# Patient Record
Sex: Female | Born: 1985
Health system: Southern US, Community
[De-identification: ages and names within clinical notes are randomized; demographics above are authoritative.]

## PROBLEM LIST (undated history)

## (undated) ENCOUNTER — Inpatient Hospital Stay (HOSPITAL_COMMUNITY): Payer: Self-pay

## (undated) DIAGNOSIS — D332 Benign neoplasm of brain, unspecified: Secondary | ICD-10-CM

## (undated) DIAGNOSIS — K589 Irritable bowel syndrome without diarrhea: Secondary | ICD-10-CM

## (undated) DIAGNOSIS — N8000 Endometriosis of the uterus, unspecified: Secondary | ICD-10-CM

## (undated) DIAGNOSIS — Z9109 Other allergy status, other than to drugs and biological substances: Secondary | ICD-10-CM

## (undated) DIAGNOSIS — M25559 Pain in unspecified hip: Secondary | ICD-10-CM

## (undated) DIAGNOSIS — E669 Obesity, unspecified: Secondary | ICD-10-CM

## (undated) DIAGNOSIS — R51 Headache: Secondary | ICD-10-CM

## (undated) DIAGNOSIS — R0600 Dyspnea, unspecified: Secondary | ICD-10-CM

## (undated) DIAGNOSIS — K219 Gastro-esophageal reflux disease without esophagitis: Secondary | ICD-10-CM

## (undated) DIAGNOSIS — I82409 Acute embolism and thrombosis of unspecified deep veins of unspecified lower extremity: Secondary | ICD-10-CM

## (undated) DIAGNOSIS — G629 Polyneuropathy, unspecified: Secondary | ICD-10-CM

## (undated) DIAGNOSIS — D259 Leiomyoma of uterus, unspecified: Secondary | ICD-10-CM

## (undated) DIAGNOSIS — J309 Allergic rhinitis, unspecified: Secondary | ICD-10-CM

## (undated) DIAGNOSIS — G894 Chronic pain syndrome: Secondary | ICD-10-CM

## (undated) DIAGNOSIS — D649 Anemia, unspecified: Secondary | ICD-10-CM

## (undated) DIAGNOSIS — N6009 Solitary cyst of unspecified breast: Secondary | ICD-10-CM

## (undated) DIAGNOSIS — F4310 Post-traumatic stress disorder, unspecified: Secondary | ICD-10-CM

## (undated) DIAGNOSIS — M329 Systemic lupus erythematosus, unspecified: Secondary | ICD-10-CM

## (undated) DIAGNOSIS — R519 Headache, unspecified: Secondary | ICD-10-CM

## (undated) DIAGNOSIS — F419 Anxiety disorder, unspecified: Secondary | ICD-10-CM

## (undated) DIAGNOSIS — N83209 Unspecified ovarian cyst, unspecified side: Secondary | ICD-10-CM

## (undated) DIAGNOSIS — M797 Fibromyalgia: Secondary | ICD-10-CM

## (undated) DIAGNOSIS — J45909 Unspecified asthma, uncomplicated: Secondary | ICD-10-CM

## (undated) DIAGNOSIS — Z6837 Body mass index (BMI) 37.0-37.9, adult: Secondary | ICD-10-CM

## (undated) DIAGNOSIS — M159 Polyosteoarthritis, unspecified: Secondary | ICD-10-CM

## (undated) DIAGNOSIS — J3081 Allergic rhinitis due to animal (cat) (dog) hair and dander: Secondary | ICD-10-CM

## (undated) HISTORY — DX: Dyspnea, unspecified: R06.00

## (undated) HISTORY — DX: Allergic rhinitis due to animal (cat) (dog) hair and dander: J30.81

## (undated) HISTORY — DX: Obesity, unspecified: E66.9

## (undated) HISTORY — PX: ECTOPIC PREGNANCY SURGERY: SHX613

## (undated) HISTORY — PX: OTHER SURGICAL HISTORY: SHX169

## (undated) HISTORY — DX: Chronic pain syndrome: G89.4

## (undated) HISTORY — PX: DILATION AND CURETTAGE OF UTERUS: SHX78

## (undated) HISTORY — DX: Anxiety disorder, unspecified: F41.9

## (undated) HISTORY — DX: Body mass index (BMI) 37.0-37.9, adult: Z68.37

## (undated) HISTORY — DX: Irritable bowel syndrome, unspecified: K58.9

## (undated) HISTORY — DX: Gastro-esophageal reflux disease without esophagitis: K21.9

## (undated) HISTORY — DX: Leiomyoma of uterus, unspecified: D25.9

## (undated) HISTORY — PX: OVARIAN CYST SURGERY: SHX726

## (undated) HISTORY — PX: INDUCED ABORTION: SHX677

## (undated) HISTORY — DX: Allergic rhinitis, unspecified: J30.9

## (undated) HISTORY — DX: Systemic lupus erythematosus, unspecified: M32.9

## (undated) HISTORY — DX: Polyosteoarthritis, unspecified: M15.9

---
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
## 2003-12-08 ENCOUNTER — Other Ambulatory Visit: Admission: RE | Admit: 2003-12-08 | Discharge: 2003-12-08 | Payer: Self-pay | Admitting: Obstetrics & Gynecology

## 2004-06-25 ENCOUNTER — Inpatient Hospital Stay (HOSPITAL_COMMUNITY): Admission: AD | Admit: 2004-06-25 | Discharge: 2004-06-26 | Payer: Self-pay | Admitting: Obstetrics and Gynecology

## 2004-07-09 ENCOUNTER — Inpatient Hospital Stay (HOSPITAL_COMMUNITY): Admission: AD | Admit: 2004-07-09 | Discharge: 2004-07-09 | Payer: Self-pay | Admitting: Obstetrics and Gynecology

## 2004-07-28 ENCOUNTER — Inpatient Hospital Stay (HOSPITAL_COMMUNITY): Admission: RE | Admit: 2004-07-28 | Discharge: 2004-07-31 | Payer: Self-pay | Admitting: Obstetrics and Gynecology

## 2004-08-04 ENCOUNTER — Inpatient Hospital Stay (HOSPITAL_COMMUNITY): Admission: AD | Admit: 2004-08-04 | Discharge: 2004-08-07 | Payer: Self-pay | Admitting: Obstetrics and Gynecology

## 2005-01-27 ENCOUNTER — Inpatient Hospital Stay (HOSPITAL_COMMUNITY): Admission: AD | Admit: 2005-01-27 | Discharge: 2005-01-27 | Payer: Self-pay | Admitting: Obstetrics & Gynecology

## 2012-03-14 HISTORY — PX: GASTRIC BYPASS: SHX52

## 2015-04-27 ENCOUNTER — Encounter (HOSPITAL_COMMUNITY): Payer: Self-pay | Admitting: *Deleted

## 2015-04-27 ENCOUNTER — Inpatient Hospital Stay (HOSPITAL_COMMUNITY)
Admission: AD | Admit: 2015-04-27 | Discharge: 2015-04-27 | Disposition: A | Discharge status: Home / Self Care | Payer: PRIVATE HEALTH INSURANCE | Source: Ambulatory Visit | Attending: Family Medicine | Admitting: Family Medicine

## 2015-04-27 DIAGNOSIS — D689 Coagulation defect, unspecified: Secondary | ICD-10-CM

## 2015-04-27 DIAGNOSIS — Z86718 Personal history of other venous thrombosis and embolism: Secondary | ICD-10-CM | POA: Insufficient documentation

## 2015-04-27 DIAGNOSIS — N939 Abnormal uterine and vaginal bleeding, unspecified: Secondary | ICD-10-CM

## 2015-04-27 DIAGNOSIS — D649 Anemia, unspecified: Secondary | ICD-10-CM | POA: Insufficient documentation

## 2015-04-27 DIAGNOSIS — R42 Dizziness and giddiness: Secondary | ICD-10-CM | POA: Diagnosis not present

## 2015-04-27 DIAGNOSIS — T457X1A Poisoning by anticoagulant antagonists, vitamin K and other coagulants, accidental (unintentional), initial encounter: Secondary | ICD-10-CM

## 2015-04-27 DIAGNOSIS — N938 Other specified abnormal uterine and vaginal bleeding: Secondary | ICD-10-CM | POA: Insufficient documentation

## 2015-04-27 HISTORY — DX: Solitary cyst of unspecified breast: N60.09

## 2015-04-27 HISTORY — DX: Acute embolism and thrombosis of unspecified deep veins of unspecified lower extremity: I82.409

## 2015-04-27 HISTORY — DX: Headache, unspecified: R51.9

## 2015-04-27 HISTORY — DX: Other allergy status, other than to drugs and biological substances: Z91.09

## 2015-04-27 HISTORY — DX: Anemia, unspecified: D64.9

## 2015-04-27 HISTORY — DX: Unspecified asthma, uncomplicated: J45.909

## 2015-04-27 HISTORY — DX: Pain in unspecified hip: M25.559

## 2015-04-27 HISTORY — DX: Unspecified ovarian cyst, unspecified side: N83.209

## 2015-04-27 HISTORY — DX: Headache: R51

## 2015-04-27 LAB — URINALYSIS, ROUTINE W REFLEX MICROSCOPIC
Bilirubin Urine: NEGATIVE
Glucose, UA: NEGATIVE mg/dL
Ketones, ur: NEGATIVE mg/dL
Leukocytes, UA: NEGATIVE
Nitrite: NEGATIVE
Protein, ur: NEGATIVE mg/dL
Specific Gravity, Urine: 1.025 (ref 1.005–1.030)
Urobilinogen, UA: 0.2 mg/dL (ref 0.0–1.0)
pH: 5.5 (ref 5.0–8.0)

## 2015-04-27 LAB — POCT PREGNANCY, URINE: Preg Test, Ur: NEGATIVE

## 2015-04-27 LAB — CBC
HCT: 36.3 % (ref 36.0–46.0)
Hemoglobin: 12.4 g/dL (ref 12.0–15.0)
MCH: 28.4 pg (ref 26.0–34.0)
MCHC: 34.2 g/dL (ref 30.0–36.0)
MCV: 83.1 fL (ref 78.0–100.0)
Platelets: 341 10*3/uL (ref 150–400)
RBC: 4.37 MIL/uL (ref 3.87–5.11)
RDW: 13.2 % (ref 11.5–15.5)
WBC: 7.7 10*3/uL (ref 4.0–10.5)

## 2015-04-27 LAB — URINE MICROSCOPIC-ADD ON

## 2015-04-27 NOTE — MAU Note (Signed)
Has been bleeding, will be 2 wks tomorrow.  Was late for period.  Last Aug, had a clot in leg; was put on blood thinners, changed types..  IUD placed in Oct to help stop bleeding; bleeding is less now.  Stopped blood thinners 2 months ago. Has felt weak and dizzy the past wk, felt like she was going to pass out at work today. Having pain in rt hip/groin.  Hx of injury, and is doing PT, pain has increased with cycle.

## 2015-04-27 NOTE — MAU Provider Note (Signed)
History     CSN: [id number]  Arrival date and time: 04/27/15 1753   None     Chief Complaint  Patient presents with  . Vaginal Bleeding   HPI  Patient is 29 y.o. [id number] here with complaints of heavy-light bleeding x 2 weeks.  "this morning I was hemorrhaging", felt light headed with "pain as it came down".    Fell today, knees hit concrete and now has right hip pain.  Sees physical therapy for hip pain, fell last year off table.   Hx of DVT, stopped Xarelto in October and then switched to Warfarin 2/2 increased bleeding nose/gums.  Stopped coumadin January to February.  Has had IUD since October with overall improved vaginal bleeding.  Pertinent Gynecological History: Menses: previously heavy, now with IUD week or more Bleeding: intermenstrual bleeding and dysfunctional uterine bleeding Contraception: IUD DES exposure: denies Blood transfusions: none Sexually transmitted diseases: hx of chlamydia 13 years ago, possibly tested 1 year ago in [place] Previous GYN Procedures: nne  Last pap: normal Date: 2015 in [place]   Past Medical History  Diagnosis Date  . Deep vein thrombosis (DVT)   . Hip discomfort   . Anemia   . Headache   . Asthma   . Environmental allergies   . Ovarian cyst   . Breast cyst     Past Surgical History  Procedure Laterality Date  . Appendectomy    . Cesarean section    . Gastric bypass    . Induced abortion    . Etopi    . Ectopic pregnancy surgery    . Ovarian cyst surgery      Family History  Problem Relation Age of Onset  . Cancer Mother   . Hypertension Mother   . Cancer Father   . Hypertension Maternal Uncle   . Diabetes Paternal Aunt   . Cancer Maternal Grandmother     History  Substance Use Topics  . Smoking status: Never Smoker   . Smokeless tobacco: Not on file  . Alcohol Use: No    Allergies:  Allergies  Allergen Reactions  . Shellfish Allergy Anaphylaxis    Patient is allergic to all seafood.  Marland Kitchen Histamine  Other (See Comments)    Patient is tested positive in allergy testing to Control-histamine 1.  . Other Swelling    Mayonnaise causes swelling of the tongue.  Marland Kitchen Peanut Butter Flavor Hives  . Penicillins Hives  . Voltaren [Diclofenac] Other (See Comments)    Volatren gel causes blisters.    Prescriptions prior to admission  Medication Sig Dispense Refill Last Dose  . albuterol (PROVENTIL HFA;VENTOLIN HFA) 108 (90 BASE) MCG/ACT inhaler Inhale 2 puffs into the lungs every 6 (six) hours as needed for wheezing or shortness of breath.   Past Week at Unknown time  . cetirizine (ZYRTEC) 10 MG tablet Take 10 mg by mouth at bedtime.   04/26/2015 at Unknown time  . dicyclomine (BENTYL) 10 MG capsule Take 10 mg by mouth 3 (three) times daily as needed for spasms.   04/26/2015 at Unknown time  . EPINEPHrine (EPIPEN IJ) Inject 1 each as directed once.   Emergency  . ferrous sulfate 325 (65 FE) MG tablet Take 325 mg by mouth daily with breakfast.   Past Week at Unknown time  . HYDROcodone-acetaminophen (NORCO/VICODIN) 5-325 MG per tablet Take 1-2 tablets by mouth every 6 (six) hours as needed for severe pain.   Past Month at Unknown time  . Levonorgestrel (SKYLA) 13.5 MG  IUD 1 each by Intrauterine route once.   Continuous  . montelukast (SINGULAIR) 10 MG tablet Take 10 mg by mouth at bedtime.   04/26/2015 at Unknown time  . Olopatadine HCl (PAZEO) 0.7 % SOLN Place 1 drop into both eyes daily.   04/26/2015 at Unknown time  . ranitidine (ZANTAC) 300 MG tablet Take 300 mg by mouth at bedtime.   04/26/2015 at Unknown time  . traMADol (ULTRAM) 50 MG tablet Take 50 mg by mouth every 6 (six) hours as needed for moderate pain.   Past Week at Unknown time  . triamcinolone cream (KENALOG) 0.5 % Apply 1 application topically 3 (three) times daily as needed (For rash.).   04/27/2015 at Unknown time    Review of Systems  Constitutional: Positive for fever (resolved). Negative for chills.  HENT: Positive for congestion  (resolved).   Respiratory: Positive for cough (resolved) and shortness of breath.   Cardiovascular: Negative for leg swelling.  Gastrointestinal: Positive for nausea, vomiting and diarrhea. Negative for heartburn.       All GI sx resolved  Genitourinary: Negative for dysuria, urgency, frequency and hematuria.  Musculoskeletal: Positive for joint pain (right hip, chronic) and falls (x1 today).  Skin: Negative for itching and rash.  Neurological: Positive for dizziness and headaches. Negative for loss of consciousness.   Physical Exam   Blood pressure 124/80, pulse 70, temperature 98.6 F (37 C), temperature source Oral, resp. rate 18, height 4\' 7"  (1.397 m), weight 159 lb (72.122 kg), last menstrual period 04/15/2015, SpO2 100 %.  Physical Exam  Constitutional: She is oriented to person, place, and time. She appears well-developed and well-nourished.  HENT:  Head: Normocephalic and atraumatic.  Eyes: Conjunctivae and EOM are normal.  Neck: Normal range of motion.  Cardiovascular: Normal rate, regular rhythm and normal heart sounds.   Respiratory: Effort normal. No respiratory distress.  GI: Soft. Bowel sounds are normal. She exhibits no distension. There is no tenderness.  Musculoskeletal: Normal range of motion. She exhibits edema (trace).  Neurological: She is alert and oriented to person, place, and time.  Skin: Skin is warm and dry. No erythema.  declined evaluation of vaginal bleeding/GU examination   MAU Course  Procedures  MDM CBC to eval for severe anemia: hgb 12  Assessment and Plan  29yo [id number] here with dizziness, heavy bleeding with current Mirena (x37mo) in place 2/2 dysfunctional uterine bleeding - continue iron supplementation - CBC to eval to severe anemia - advised to follow up with OB/GYN as outpt for possible ablation - advised that mirena likely helping bleeding rather than worsening, which patient agreed with.  Nina Wood 04/27/2015, 7:13 PM

## 2015-04-27 NOTE — MAU Note (Addendum)
C/o bleeding for 2 weeks; hx of DVT- stopped coumadin in January; IUD placed in October; increased pain in her R hip with periods; c/o fall today;

## 2015-04-27 NOTE — Discharge Instructions (Signed)
Abnormal Uterine Bleeding Abnormal uterine bleeding can affect women at various stages in life, including teenagers, women in their reproductive years, pregnant women, and women who have reached menopause. Several kinds of uterine bleeding are considered abnormal, including:  Bleeding or spotting between periods.   Bleeding after sexual intercourse.   Bleeding that is heavier or more than normal.   Periods that last longer than usual.  Bleeding after menopause.  Many cases of abnormal uterine bleeding are minor and simple to treat, while others are more serious. Any type of abnormal bleeding should be evaluated by your health care provider. Treatment will depend on the cause of the bleeding. HOME CARE INSTRUCTIONS Monitor your condition for any changes. The following actions may help to alleviate any discomfort you are experiencing:  Avoid the use of tampons and douches as directed by your health care provider.  Change your pads frequently. You should get regular pelvic exams and Pap tests. Keep all follow-up appointments for diagnostic tests as directed by your health care provider.  SEEK MEDICAL CARE IF:   Your bleeding lasts more than 1 week.   You feel dizzy at times.  SEEK IMMEDIATE MEDICAL CARE IF:   You pass out.   You are changing pads every 15 to 30 minutes.   You have abdominal pain.  You have a fever.   You become sweaty or weak.   You are passing large blood clots from the vagina.   You start to feel nauseous and vomit. MAKE SURE YOU:   Understand these instructions.  Will watch your condition.  Will get help right away if you are not doing well or get worse. Document Released: 10/30/2005 Document Revised: 11/04/2013 Document Reviewed: 05/29/2013 ExitCare Patient Information 2015 [employer]. This information is not intended to replace advice given to you by your health care provider. Make sure you discuss any questions you have with your  health care provider.  

## 2015-05-05 ENCOUNTER — Inpatient Hospital Stay (HOSPITAL_COMMUNITY)
Admission: AD | Admit: 2015-05-05 | Discharge: 2015-05-05 | Disposition: A | Discharge status: Home / Self Care | Payer: PRIVATE HEALTH INSURANCE | Source: Ambulatory Visit | Attending: Obstetrics & Gynecology | Admitting: Obstetrics & Gynecology

## 2015-05-05 ENCOUNTER — Encounter (HOSPITAL_COMMUNITY): Payer: Self-pay

## 2015-05-05 ENCOUNTER — Inpatient Hospital Stay (HOSPITAL_COMMUNITY): Payer: PRIVATE HEALTH INSURANCE

## 2015-05-05 DIAGNOSIS — R102 Pelvic and perineal pain: Secondary | ICD-10-CM | POA: Diagnosis not present

## 2015-05-05 DIAGNOSIS — N939 Abnormal uterine and vaginal bleeding, unspecified: Secondary | ICD-10-CM

## 2015-05-05 DIAGNOSIS — T8332XA Displacement of intrauterine contraceptive device, initial encounter: Secondary | ICD-10-CM | POA: Insufficient documentation

## 2015-05-05 DIAGNOSIS — N8320 Unspecified ovarian cysts: Secondary | ICD-10-CM | POA: Diagnosis not present

## 2015-05-05 DIAGNOSIS — N83201 Unspecified ovarian cyst, right side: Secondary | ICD-10-CM

## 2015-05-05 DIAGNOSIS — R1031 Right lower quadrant pain: Secondary | ICD-10-CM | POA: Diagnosis present

## 2015-05-05 LAB — URINALYSIS, ROUTINE W REFLEX MICROSCOPIC
Bilirubin Urine: NEGATIVE
Glucose, UA: NEGATIVE mg/dL
Hgb urine dipstick: NEGATIVE
Ketones, ur: NEGATIVE mg/dL
Leukocytes, UA: NEGATIVE
Nitrite: NEGATIVE
Protein, ur: NEGATIVE mg/dL
Specific Gravity, Urine: 1.02 (ref 1.005–1.030)
Urobilinogen, UA: 0.2 mg/dL (ref 0.0–1.0)
pH: 6 (ref 5.0–8.0)

## 2015-05-05 MED ORDER — KETOROLAC TROMETHAMINE 30 MG/ML IJ SOLN
30.0000 mg | Freq: Once | INTRAMUSCULAR | Status: AC
  Administered 2015-05-05: 30 mg via INTRAMUSCULAR
  Filled 2015-05-05: qty 1

## 2015-05-05 MED ORDER — IBUPROFEN 600 MG PO TABS: 600.0000 mg | ORAL_TABLET | Freq: Four times a day (QID) | ORAL | Status: DC | PRN

## 2015-05-05 NOTE — MAU Provider Note (Signed)
History     CSN: [id number]  Arrival date and time: 05/05/15 1523   First Provider Initiated Contact with Patient 05/05/15 1617      Chief Complaint  Patient presents with  . Abdominal Pain   HPI Comments: Nina Wood is a 29 y.o. [phone] presents with worsening right lower quadrant abdominal pain. Onset was 04/27/2015 when she was seen here for vaginal bleeding. States she saw her gynecologist in [place] the next day with pelvic exam/cultures and then had a [facility] at [facility]/30/[facility]59. She was diagnosed with right ovarian cyst. She called her gynecologist today and was told to go to the ED. 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    Abdominal Pain This is a new problem. The current episode started in the past 7 days. The onset quality is gradual. The problem occurs constantly. The most recent episode lasted 7 days. The problem has been waxing and waning. The pain is located in the RLQ. The pain is at a severity of 8/10. The pain is severe. The quality of the pain is sharp. The abdominal pain radiates to the back and RUQ. Associated symptoms include nausea and vomiting. Pertinent negatives include no constipation, diarrhea, frequency, hematuria or melena. Nothing aggravates the pain. The pain is relieved by nothing (Has tried ibuprofen. Has Norco and Ultram but only took it on the weekend due to driving and work). Treatments tried: tried Ultram and Norco on the weekend with some relief. Has tried ibuprofen with little relief. Her past medical history is significant for abdominal surgery. There is no history of GERD or irritable bowel syndrome.    Gynecology Hx:  Menses: Previously heavy now light since IUD placement; has had intermenstrual bleeding and menorrhagia Contraception: Skylar IUD since October 2015 STI: Chlamydia several years ago Previous GYN procedures: None Last  Pap normal in [place].    Past Medical History  Diagnosis Date  . Deep vein thrombosis (DVT)   . Hip discomfort   . Anemia   . Headache   . Asthma   . Environmental allergies   . Ovarian cyst   . Breast cyst     Past Surgical History  Procedure Laterality Date  . Appendectomy    . Cesarean section    . Gastric bypass    . Induced abortion    . Etopi    . Ectopic pregnancy surgery    . Ovarian cyst surgery      Family History  Problem Relation Age of Onset  . Cancer Mother   . Hypertension Mother   . Cancer Father   . Hypertension Maternal Uncle   . Diabetes Paternal Aunt   . Cancer Maternal Grandmother     History  Substance Use Topics  . Smoking status: Never Smoker   . Smokeless tobacco: Not on file  . Alcohol Use: No    Allergies:  Allergies  Allergen Reactions  . Shellfish Allergy Anaphylaxis    Patient is allergic to all seafood.  Marland Kitchen Histamine Other (See Comments)    Patient is tested positive in allergy testing to Control-histamine 1.  . Other Swelling    Mayonnaise causes swelling of the tongue.  Marland Kitchen Peanut Butter Flavor Hives  . Penicillins Hives  . Voltaren [Diclofenac] Other (See Comments)    Volatren gel causes blisters.    Prescriptions prior to admission  Medication Sig Dispense Refill Last Dose  .  albuterol (PROVENTIL HFA;VENTOLIN HFA) 108 (90 BASE) MCG/ACT inhaler Inhale 2 puffs into the lungs every 6 (six) hours as needed for wheezing or shortness of breath.   Past Week at Unknown time  . cetirizine (ZYRTEC) 10 MG tablet Take 10 mg by mouth at bedtime.   05/04/2015 at Unknown time  . dicyclomine (BENTYL) 10 MG capsule Take 10 mg by mouth 3 (three) times daily as needed for spasms.   Past Week at Unknown time  . ferrous sulfate 325 (65 FE) MG tablet Take 325 mg by mouth daily with breakfast.   05/04/2015 at Unknown time  . HYDROcodone-acetaminophen (NORCO/VICODIN) 5-325 MG per tablet Take 1-2 tablets by mouth every 6 (six) hours as needed  for severe pain.   Past Week at Unknown time  . montelukast (SINGULAIR) 10 MG tablet Take 10 mg by mouth at bedtime.   05/04/2015 at Unknown time  . Olopatadine HCl (PAZEO) 0.7 % SOLN Place 1 drop into both eyes daily.   Past Week at Unknown time  . ranitidine (ZANTAC) 300 MG tablet Take 300 mg by mouth at bedtime.   Past Month at Unknown time  . traMADol (ULTRAM) 50 MG tablet Take 50 mg by mouth every 6 (six) hours as needed for moderate pain.   05/04/2015 at Unknown time  . triamcinolone cream (KENALOG) 0.5 % Apply 1 application topically 3 (three) times daily as needed (For rash.).   05/04/2015 at Unknown time  . EPINEPHrine (EPIPEN IJ) Inject 1 each as directed once.   Emergency  . Levonorgestrel (SKYLA) 13.5 MG IUD 1 each by Intrauterine route once.   Continuous    Review of Systems  Gastrointestinal: Positive for nausea, vomiting and abdominal pain. Negative for diarrhea, constipation and melena.  Genitourinary: Negative for frequency and hematuria.   Physical Exam   Blood pressure 115/83, pulse 71, temperature 98.3 F (36.8 C), temperature source Oral, resp. rate 20, last menstrual period 04/15/2015.  Physical Exam  Nursing note and vitals reviewed. Constitutional: She is oriented to person, place, and time. She appears well-developed and well-nourished. No distress.  HENT:  Head: Normocephalic.  Eyes: Pupils are equal, round, and reactive to light.  Neck: Normal range of motion.  Cardiovascular: Normal rate.   Respiratory: Effort normal.  GI: Soft. Bowel sounds are normal. She exhibits no distension and no mass. There is tenderness. There is no rebound and no guarding.  RLQ  Genitourinary:  Deferred  Musculoskeletal: Normal range of motion.  Neurological: She is alert and oriented to person, place, and time.  Skin: Skin is warm and dry.  Psychiatric: She has a normal mood and affect. Her behavior is normal. Thought content normal.    MAU Course  Procedures  Results for  orders placed or performed during the hospital encounter of 05/05/15 (from the past 24 hour(s))  Urinalysis, Routine w reflex microscopic (not at [facility])     Status: None   Collection Time: 05/05/15  3:25 PM  Result Value Ref Range   Color, Urine YELLOW YELLOW   APPearance CLEAR CLEAR   Specific Gravity, Urine 1.020 1.005 - 1.030   pH 6.0 5.0 - 8.0   Glucose, UA NEGATIVE NEGATIVE mg/dL   Hgb urine dipstick NEGATIVE NEGATIVE   Bilirubin Urine NEGATIVE NEGATIVE   Ketones, ur NEGATIVE NEGATIVE mg/dL   Protein, ur NEGATIVE NEGATIVE mg/dL   Urobilinogen, UA 0.2 0.0 - 1.0 mg/dL   Nitrite NEGATIVE NEGATIVE   Leukocytes, UA NEGATIVE NEGATIVE   Toradol 30mg  IM given>  good relief    CLINICAL DATA: Patient with pelvic pain and history of ovarian cyst. Abnormal bleeding.  EXAM: TRANSABDOMINAL AND TRANSVAGINAL ULTRASOUND OF PELVIS  TECHNIQUE: Both transabdominal and transvaginal ultrasound examinations of the pelvis were performed. Transabdominal technique was performed for global imaging of the pelvis including uterus, ovaries, adnexal regions, and pelvic cul-de-sac. It was necessary to proceed with endovaginal exam following the transabdominal exam to visualize the adnexal structures.  COMPARISON: CT 04/30/2015  FINDINGS: Uterus  Measurements: 9.6 x 4.4 x 5.1 cm. No fibroids or other mass visualized.  Endometrium  Thickness: 6 mm. Intrauterine device is present. N is located within the lower uterine segment and cervix.  Right ovary  Measurements: 5.6 x 3.6 x 4.3 cm. There is a 3.9 x 2.3 x 2.7 cm cyst within the right ovary.  Left ovary  Measurements: 2.9 x 1.9 x 2.0 cm. Normal appearance/no adnexal mass.  Other findings  No free fluid.  IMPRESSION: Intrauterine device is located within the lower uterine segment/ cervix, malpositioned.  Endometrium measures 6 mm. If bleeding remains unresponsive to hormonal or medical therapy, sonohysterogram  should be considered for focal lesion work-up. (Ref: Radiological Reasoning: Algorithmic Workup of Abnormal Vaginal Bleeding with Endovaginal Sonography and Sonohysterography. AJR 2008; [id number]:Q76-19)   Electronically Signed  By: Lovey Newcomer M.D.  On: 05/05/2015 18:16      Assessment and Plan   1. Cyst of right ovary   2. Pelvic pain in female   3. Abnormal uterine bleeding (AUB)   4. Malpositioned IUD, initial encounter    Discharge with ibuprofen added to pain management regimen   Medication List    TAKE these medications        albuterol 108 (90 BASE) MCG/ACT inhaler  Commonly known as:  PROVENTIL HFA;VENTOLIN HFA  Inhale 2 puffs into the lungs every 6 (six) hours as needed for wheezing or shortness of breath.     cetirizine 10 MG tablet  Commonly known as:  ZYRTEC  Take 10 mg by mouth at bedtime.     dicyclomine 10 MG capsule  Commonly known as:  BENTYL  Take 10 mg by mouth 3 (three) times daily as needed for spasms.     EPIPEN IJ  Inject 1 each as directed once.     ferrous sulfate 325 (65 FE) MG tablet  Take 325 mg by mouth daily with breakfast.     HYDROcodone-acetaminophen 5-325 MG per tablet  Commonly known as:  NORCO/VICODIN  Take 1-2 tablets by mouth every 6 (six) hours as needed for severe pain.     ibuprofen 600 MG tablet  Commonly known as:  ADVIL,MOTRIN  Take 1 tablet (600 mg total) by mouth every 6 (six) hours as needed.     montelukast 10 MG tablet  Commonly known as:  SINGULAIR  Take 10 mg by mouth at bedtime.     PAZEO 0.7 % Soln  Generic drug:  Olopatadine HCl  Place 1 drop into both eyes daily.     ranitidine 300 MG tablet  Commonly known as:  ZANTAC  Take 300 mg by mouth at bedtime.     SKYLA 13.5 MG Iud  Generic drug:  Levonorgestrel  1 each by Intrauterine route once.     traMADol 50 MG tablet  Commonly known as:  ULTRAM  Take 50 mg by mouth every 6 (six) hours as needed for moderate pain.     triamcinolone cream 0.5 %   Commonly known as:  KENALOG  Apply 1  application topically 3 (three) times daily as needed (For rash.).       Follow-up Information    Follow up with [facility] gynecologist In 2 weeks.   Why:  Keep your scheduled  appointment    .  Dhani Imel 05/05/2015, 4:44 PM

## 2015-05-05 NOTE — Discharge Instructions (Signed)
Ovarian Cyst An ovarian cyst is a fluid-filled sac that forms on an ovary. The ovaries are small organs that produce eggs in women. Various types of cysts can form on the ovaries. Most are not cancerous. Many do not cause problems, and they often go away on their own. Some may cause symptoms and require treatment. Common types of ovarian cysts include:  Functional cysts--These cysts may occur every month during the menstrual cycle. This is normal. The cysts usually go away with the next menstrual cycle if the woman does not get pregnant. Usually, there are no symptoms with a functional cyst.  Endometrioma cysts--These cysts form from the tissue that lines the uterus. They are also called "chocolate cysts" because they become filled with blood that turns brown. This type of cyst can cause pain in the lower abdomen during intercourse and with your menstrual period.  Cystadenoma cysts--This type develops from the cells on the outside of the ovary. These cysts can get very big and cause lower abdomen pain and pain with intercourse. This type of cyst can twist on itself, cut off its blood supply, and cause severe pain. It can also easily rupture and cause a lot of pain.  Dermoid cysts--This type of cyst is sometimes found in both ovaries. These cysts may contain different kinds of body tissue, such as skin, teeth, hair, or cartilage. They usually do not cause symptoms unless they get very big.  Theca lutein cysts--These cysts occur when too much of a certain hormone (human chorionic gonadotropin) is produced and overstimulates the ovaries to produce an egg. This is most common after procedures used to assist with the conception of a baby (in vitro fertilization). CAUSES   Fertility drugs can cause a condition in which multiple large cysts are formed on the ovaries. This is called ovarian hyperstimulation syndrome.  A condition called polycystic ovary syndrome can cause hormonal imbalances that can lead to  nonfunctional ovarian cysts. SIGNS AND SYMPTOMS  Many ovarian cysts do not cause symptoms. If symptoms are present, they may include:  Pelvic pain or pressure.  Pain in the lower abdomen.  Pain during sexual intercourse.  Increasing girth (swelling) of the abdomen.  Abnormal menstrual periods.  Increasing pain with menstrual periods.  Stopping having menstrual periods without being pregnant. DIAGNOSIS  These cysts are commonly found during a routine or annual pelvic exam. Tests may be ordered to find out more about the cyst. These tests may include:  Ultrasound.  X-ray of the pelvis.  CT scan.  MRI.  Blood tests. TREATMENT  Many ovarian cysts go away on their own without treatment. Your health care provider may want to check your cyst regularly for 2-3 months to see if it changes. For women in menopause, it is particularly important to monitor a cyst closely because of the higher rate of ovarian cancer in menopausal women. When treatment is needed, it may include any of the following:  A procedure to drain the cyst (aspiration). This may be done using a long needle and ultrasound. It can also be done through a laparoscopic procedure. This involves using a thin, lighted tube with a tiny camera on the end (laparoscope) inserted through a small incision.  Surgery to remove the whole cyst. This may be done using laparoscopic surgery or an open surgery involving a larger incision in the lower abdomen.  Hormone treatment or birth control pills. These methods are sometimes used to help dissolve a cyst. HOME CARE INSTRUCTIONS   Only take over-the-counter   or prescription medicines as directed by your health care provider.  Follow up with your health care provider as directed.  Get regular pelvic exams and Pap tests. SEEK MEDICAL CARE IF:   Your periods are late, irregular, or painful, or they stop.  Your pelvic pain or abdominal pain does not go away.  Your abdomen becomes  larger or swollen.  You have pressure on your bladder or trouble emptying your bladder completely.  You have pain during sexual intercourse.  You have feelings of fullness, pressure, or discomfort in your stomach.  You lose weight for no apparent reason.  You feel generally ill.  You become constipated.  You lose your appetite.  You develop acne.  You have an increase in body and facial hair.  You are gaining weight, without changing your exercise and eating habits.  You think you are pregnant. SEEK IMMEDIATE MEDICAL CARE IF:   You have increasing abdominal pain.  You feel sick to your stomach (nauseous), and you throw up (vomit).  You develop a fever that comes on suddenly.  You have abdominal pain during a bowel movement.  Your menstrual periods become heavier than usual. MAKE SURE YOU:  Understand these instructions.  Will watch your condition.  Will get help right away if you are not doing well or get worse. Document Released: 10/30/2005 Document Revised: 11/04/2013 Document Reviewed: 07/07/2013 ExitCare Patient Information 2015 [employer]. This information is not intended to replace advice given to you by your health care provider. Make sure you discuss any questions you have with your health care provider.  

## 2015-05-05 NOTE — MAU Note (Signed)
Pt has GYN in [place], dx'd with ovarian cyst on Friday @ [facility].  Pt was also seen in MAU last week.  RLQ pain is becoming more severe, also hurts up into upper side, lower back, R hip & thigh.  Pt states she has been bleeding for almost 20 days.

## 2015-05-05 NOTE — Progress Notes (Signed)
Patient states she had CT scan done at [facility] last Friday has CD with her.  Was told to f/u with her GYN however they are unable to see her for two weeks and to go to the ED if pain worsened which it has. She came to [facility] because she was in [place] at her daughters dental appointments.

## 2015-06-30 ENCOUNTER — Other Ambulatory Visit (HOSPITAL_COMMUNITY): Payer: Self-pay | Admitting: Obstetrics & Gynecology

## 2015-06-30 DIAGNOSIS — R109 Unspecified abdominal pain: Secondary | ICD-10-CM

## 2015-06-30 DIAGNOSIS — K8020 Calculus of gallbladder without cholecystitis without obstruction: Secondary | ICD-10-CM

## 2015-06-30 DIAGNOSIS — N83209 Unspecified ovarian cyst, unspecified side: Secondary | ICD-10-CM

## 2015-07-07 ENCOUNTER — Other Ambulatory Visit: Payer: PRIVATE HEALTH INSURANCE

## 2015-07-08 ENCOUNTER — Ambulatory Visit
Admission: RE | Admit: 2015-07-08 | Discharge: 2015-07-08 | Disposition: A | Discharge status: Home / Self Care | Payer: PRIVATE HEALTH INSURANCE | Source: Ambulatory Visit | Attending: Obstetrics & Gynecology | Admitting: Obstetrics & Gynecology

## 2015-07-08 DIAGNOSIS — K8020 Calculus of gallbladder without cholecystitis without obstruction: Secondary | ICD-10-CM

## 2015-07-08 DIAGNOSIS — R109 Unspecified abdominal pain: Secondary | ICD-10-CM

## 2015-07-08 DIAGNOSIS — N83209 Unspecified ovarian cyst, unspecified side: Secondary | ICD-10-CM

## 2015-09-30 ENCOUNTER — Other Ambulatory Visit: Payer: Self-pay | Admitting: Obstetrics and Gynecology

## 2015-10-02 ENCOUNTER — Other Ambulatory Visit: Payer: Self-pay

## 2015-10-02 ENCOUNTER — Emergency Department (EMERGENCY_DEPARTMENT_HOSPITAL)
Admit: 2015-10-02 | Discharge: 2015-10-02 | Disposition: A | Discharge status: Home / Self Care | Payer: PRIVATE HEALTH INSURANCE

## 2015-10-02 ENCOUNTER — Emergency Department (HOSPITAL_COMMUNITY)
Admission: EM | Admit: 2015-10-02 | Discharge: 2015-10-02 | Disposition: A | Discharge status: Home / Self Care | Payer: PRIVATE HEALTH INSURANCE | Attending: Emergency Medicine | Admitting: Emergency Medicine

## 2015-10-02 ENCOUNTER — Encounter (HOSPITAL_COMMUNITY): Payer: Self-pay | Admitting: Nurse Practitioner

## 2015-10-02 DIAGNOSIS — J45909 Unspecified asthma, uncomplicated: Secondary | ICD-10-CM | POA: Diagnosis not present

## 2015-10-02 DIAGNOSIS — Z8742 Personal history of other diseases of the female genital tract: Secondary | ICD-10-CM | POA: Insufficient documentation

## 2015-10-02 DIAGNOSIS — Z79899 Other long term (current) drug therapy: Secondary | ICD-10-CM | POA: Diagnosis not present

## 2015-10-02 DIAGNOSIS — M79609 Pain in unspecified limb: Secondary | ICD-10-CM

## 2015-10-02 DIAGNOSIS — Z880 Allergy status to penicillin: Secondary | ICD-10-CM | POA: Diagnosis not present

## 2015-10-02 DIAGNOSIS — F419 Anxiety disorder, unspecified: Secondary | ICD-10-CM | POA: Insufficient documentation

## 2015-10-02 DIAGNOSIS — Z3202 Encounter for pregnancy test, result negative: Secondary | ICD-10-CM | POA: Diagnosis not present

## 2015-10-02 DIAGNOSIS — D649 Anemia, unspecified: Secondary | ICD-10-CM | POA: Diagnosis not present

## 2015-10-02 DIAGNOSIS — Z86718 Personal history of other venous thrombosis and embolism: Secondary | ICD-10-CM | POA: Insufficient documentation

## 2015-10-02 DIAGNOSIS — M79605 Pain in left leg: Secondary | ICD-10-CM | POA: Insufficient documentation

## 2015-10-02 DIAGNOSIS — D689 Coagulation defect, unspecified: Secondary | ICD-10-CM

## 2015-10-02 DIAGNOSIS — T457X1A Poisoning by anticoagulant antagonists, vitamin K and other coagulants, accidental (unintentional), initial encounter: Secondary | ICD-10-CM

## 2015-10-02 DIAGNOSIS — N939 Abnormal uterine and vaginal bleeding, unspecified: Secondary | ICD-10-CM

## 2015-10-02 LAB — CBC WITH DIFFERENTIAL/PLATELET
Basophils Absolute: 0 10*3/uL (ref 0.0–0.1)
Basophils Relative: 0 %
Eosinophils Absolute: 0 10*3/uL (ref 0.0–0.7)
Eosinophils Relative: 0 %
HCT: 36.8 % (ref 36.0–46.0)
Hemoglobin: 11.9 g/dL — ABNORMAL LOW (ref 12.0–15.0)
Lymphocytes Relative: 27 %
Lymphs Abs: 2.4 10*3/uL (ref 0.7–4.0)
MCH: 27.6 pg (ref 26.0–34.0)
MCHC: 32.3 g/dL (ref 30.0–36.0)
MCV: 85.4 fL (ref 78.0–100.0)
Monocytes Absolute: 0.6 10*3/uL (ref 0.1–1.0)
Monocytes Relative: 7 %
Neutro Abs: 5.9 10*3/uL (ref 1.7–7.7)
Neutrophils Relative %: 66 %
Platelets: 281 10*3/uL (ref 150–400)
RBC: 4.31 MIL/uL (ref 3.87–5.11)
RDW: 13.7 % (ref 11.5–15.5)
WBC: 8.9 10*3/uL (ref 4.0–10.5)

## 2015-10-02 LAB — BASIC METABOLIC PANEL
Anion gap: 5 (ref 5–15)
BUN: 14 mg/dL (ref 6–20)
CO2: 26 mmol/L (ref 22–32)
Calcium: 8.6 mg/dL — ABNORMAL LOW (ref 8.9–10.3)
Chloride: 106 mmol/L (ref 101–111)
Creatinine, Ser: 0.75 mg/dL (ref 0.44–1.00)
GFR calc Af Amer: >60 mL/min (ref >60)
GFR calc non Af Amer: >60 mL/min (ref >60)
Glucose, Bld: 90 mg/dL (ref 65–99)
Potassium: 4.1 mmol/L (ref 3.5–5.1)
Sodium: 137 mmol/L (ref 135–145)

## 2015-10-02 LAB — I-STAT TROPONIN, ED: Troponin i, poc: 0 ng/mL (ref 0.00–0.08)

## 2015-10-02 LAB — PROTIME-INR
INR: 0.96 (ref 0.00–1.49)
Prothrombin Time: 13 seconds (ref 11.6–15.2)

## 2015-10-02 LAB — I-STAT BETA HCG BLOOD, ED (MC, WL, AP ONLY): I-stat hCG, quantitative: <5 m[IU]/mL (ref <5)

## 2015-10-02 MED ORDER — HYDROCODONE-ACETAMINOPHEN 5-325 MG PO TABS: 1.0000 | ORAL_TABLET | Freq: Four times a day (QID) | ORAL | Status: DC | PRN

## 2015-10-02 MED ORDER — HYDROCODONE-ACETAMINOPHEN 5-325 MG PO TABS
2.0000 | ORAL_TABLET | Freq: Once | ORAL | Status: DC
  Filled 2015-10-02: qty 2

## 2015-10-02 NOTE — ED Notes (Signed)
She c/o 2 week history of LLE pain, behind her calf. Pain is constant. Pain is decreased with elevation. Tried otc pain meds with no relief. She has hx DVT.

## 2015-10-02 NOTE — ED Provider Notes (Signed)
CSN: [id number]     Arrival date & time 10/02/15  1523 History   First MD Initiated Contact with Patient 10/02/15 1613     Chief Complaint  Patient presents with  . Leg Pain     (Consider location/radiation/quality/duration/timing/severity/associated sxs/prior Treatment) The history is provided by the patient.  DEVOTA BRADY is a 29 y.o. female hx of DVT a year ago off anticoagulation here with left leg pain, chest pain. Progressively worsening left leg pain for the last 2 weeks. Worse when she walks. Been taking Tylenol and Motrin with no relief. She states that she has intermittent chest pain for last 2 weeks as well but denies any shortness of breath. Denies any pleuritic chest pain or palpitations. Denies any fevers or chills. States that her pain is not as bad as previous DVT. Of note, patient was on Zaroxolyn initially but has some bleeding so was switched to Coumadin was discontinued in January of this year. She did travel to [place] in July but no travel since then. She is on a birth control with only progesterone with no estrogen.     Past Medical History  Diagnosis Date  . Deep vein thrombosis (DVT) ([place])   . Hip discomfort   . Anemia   . Headache   . Asthma   . Environmental allergies   . Ovarian cyst   . Breast cyst    Past Surgical History  Procedure Laterality Date  . Appendectomy    . Cesarean section    . Gastric bypass    . Induced abortion    . Etopi    . Ectopic pregnancy surgery    . Ovarian cyst surgery     Family History  Problem Relation Age of Onset  . Cancer Mother   . Hypertension Mother   . Cancer Father   . Hypertension Maternal Uncle   . Diabetes Paternal Aunt   . Cancer Maternal Grandmother    Social History  Substance Use Topics  . Smoking status: Never Smoker   . Smokeless tobacco: None  . Alcohol Use: No   OB History    Gravida Para Term Preterm AB TAB SAB Ectopic Multiple Living   5 2 2  3 1 2   2      Review of Systems   Musculoskeletal:       L calf pain   All other systems reviewed and are negative.     Allergies  Shellfish allergy; Histamine; Other; Peanut butter flavor; Penicillins; and Voltaren  Home Medications   Prior to Admission medications   Medication Sig Start Date End Date Taking? Authorizing Provider  albuterol (PROVENTIL HFA;VENTOLIN HFA) 108 (90 BASE) MCG/ACT inhaler Inhale 2 puffs into the lungs every 6 (six) hours as needed for wheezing or shortness of breath.    Historical Provider, MD  cetirizine (ZYRTEC) 10 MG tablet Take 10 mg by mouth at bedtime.    Historical Provider, MD  dicyclomine (BENTYL) 10 MG capsule Take 10 mg by mouth 3 (three) times daily as needed for spasms.    Historical Provider, MD  EPINEPHrine (EPIPEN IJ) Inject 1 each as directed once.    Historical Provider, MD  ferrous sulfate 325 (65 FE) MG tablet Take 325 mg by mouth daily with breakfast.    Historical Provider, MD  HYDROcodone-acetaminophen (NORCO/VICODIN) 5-325 MG per tablet Take 1-2 tablets by mouth every 6 (six) hours as needed for severe pain.    Historical Provider, MD  ibuprofen (ADVIL,MOTRIN) 600 MG  tablet TAKE 1 TABLET(600 MG) BY MOUTH EVERY 6 HOURS AS NEEDED 10/01/15   Deirdre Freida Busman, CNM  Levonorgestrel (SKYLA) 13.5 MG IUD 1 each by Intrauterine route once.    Historical Provider, MD  montelukast (SINGULAIR) 10 MG tablet Take 10 mg by mouth at bedtime.    Historical Provider, MD  Olopatadine HCl (PAZEO) 0.7 % SOLN Place 1 drop into both eyes daily.    Historical Provider, MD  ranitidine (ZANTAC) 300 MG tablet Take 300 mg by mouth at bedtime.    Historical Provider, MD  traMADol (ULTRAM) 50 MG tablet Take 50 mg by mouth every 6 (six) hours as needed for moderate pain.    Historical Provider, MD  triamcinolone cream (KENALOG) 0.5 % Apply 1 application topically 3 (three) times daily as needed (For rash.).    Historical Provider, MD   BP 125/79 mmHg  Pulse 81  Temp(Src) 98 F (36.7 C) (Oral)  Resp  16  Ht 4\' 8"  (1.422 m)  Wt 159 lb 9.6 oz (72.394 kg)  BMI 35.80 kg/m2  SpO2 100% Physical Exam  Constitutional: She is oriented to person, place, and time. She appears well-developed.  Anxious   HENT:  Head: Normocephalic.  Mouth/Throat: Oropharynx is clear and moist.  Eyes: Conjunctivae are normal. Pupils are equal, round, and reactive to light.  Neck: Normal range of motion. Neck supple.  Cardiovascular: Normal rate, regular rhythm and normal heart sounds.   Pulmonary/Chest: Effort normal and breath sounds normal. No respiratory distress. She has no wheezes. She has no rales.  Abdominal: Soft. Bowel sounds are normal. She exhibits no distension. There is no tenderness. There is no rebound.  Musculoskeletal: Normal range of motion.  L calf more swollen compared to right. 2+ pedal pulses. + calf tenderness. Neurovascular intact   Neurological: She is alert and oriented to person, place, and time.  Skin: Skin is warm and dry.  Psychiatric: She has a normal mood and affect. Her behavior is normal. Judgment and thought content normal.  Nursing note and vitals reviewed.   ED Course  Procedures (including critical care time) Labs Review Labs Reviewed  CBC WITH DIFFERENTIAL/PLATELET - Abnormal; Notable for the following:    Hemoglobin 11.9 (*)    All other components within normal limits  BASIC METABOLIC PANEL - Abnormal; Notable for the following:    Calcium 8.6 (*)    All other components within normal limits  PROTIME-INR  I-STAT TROPOININ, ED  I-STAT BETA HCG BLOOD, ED ([facility], [facility], [facility] ONLY)    Imaging Review No results found. I have personally reviewed and evaluated these images and lab results as part of my medical decision-making.   EKG Interpretation   Date/Time:  Saturday October 02 2015 17:12:32 EST Ventricular Rate:  75 PR Interval:  275 QRS Duration: 91 QT Interval:  380 QTC Calculation: 424 R Axis:   41 Text Interpretation:  Sinus rhythm Prolonged PR interval No  previous ECGs  available Confirmed by YAO  MD, DAVID ([id number]) on 10/02/2015 5:21:36 PM      MDM   Final diagnoses:  None   KATELAND MOWERY is a 29 y.o. female here with L calf pain. Likely DVT vs muscle strain. Will get DVT study.   5:21 PM DVT neg. I think likely chronic venous insufficiency. Recommend leg elevation, compression stockings.    Wandra Arthurs, MD 10/02/15 [phone]

## 2015-10-02 NOTE — Progress Notes (Signed)
VASCULAR LAB PRELIMINARY  PRELIMINARY  PRELIMINARY  PRELIMINARY  Left lower extremity venous duplex completed.    Preliminary report:  There is no DVT or SVT noted in the left lower extremity.   Biran Mayberry, RVT 10/02/2015, 4:54 PM

## 2015-10-02 NOTE — Discharge Instructions (Signed)
Keep left leg elevated and use compression stockings.   Take motrin for pain.   Take vicodin for severe pain. Do NOT drive with it.   See your doctor.   Return to ER if you have worse leg swelling, chest pain, shortness of breath.

## 2016-03-02 ENCOUNTER — Ambulatory Visit (INDEPENDENT_AMBULATORY_CARE_PROVIDER_SITE_OTHER): Payer: PRIVATE HEALTH INSURANCE | Admitting: Allergy and Immunology

## 2016-03-02 ENCOUNTER — Encounter: Payer: Self-pay | Admitting: Allergy and Immunology

## 2016-03-02 VITALS — BP 90/68 | HR 88 | Resp 20 | Ht <= 58 in | Wt 155.4 lb

## 2016-03-02 DIAGNOSIS — J309 Allergic rhinitis, unspecified: Secondary | ICD-10-CM | POA: Diagnosis not present

## 2016-03-02 DIAGNOSIS — J4520 Mild intermittent asthma, uncomplicated: Secondary | ICD-10-CM

## 2016-03-02 DIAGNOSIS — H1010 Acute atopic conjunctivitis, unspecified eye: Secondary | ICD-10-CM

## 2016-03-02 DIAGNOSIS — T782XXD Anaphylactic shock, unspecified, subsequent encounter: Secondary | ICD-10-CM | POA: Diagnosis not present

## 2016-03-02 DIAGNOSIS — L509 Urticaria, unspecified: Secondary | ICD-10-CM | POA: Diagnosis not present

## 2016-03-02 DIAGNOSIS — Z91038 Other insect allergy status: Secondary | ICD-10-CM | POA: Diagnosis not present

## 2016-03-02 DIAGNOSIS — Z91030 Bee allergy status: Secondary | ICD-10-CM

## 2016-03-02 MED ORDER — OMALIZUMAB 150 MG ~~LOC~~ SOLR: 300.0000 mg | SUBCUTANEOUS | Status: DC

## 2016-03-02 NOTE — Patient Instructions (Addendum)
  1. Submit for Xolair  2. Continue EpiPen, Benadryl, M.D./ER for allergic reaction  3. Continue combination: montelukast 10 mg, ranitidine 300 mg, cetirizine 10 mg daily  4. Can increase cetirizine to 20-30 mg daily  5. Continue ProAir HFA 2 puffs every 4-6 hours if needed  6. Continue OTC Rhinocort one spray each nostril once a day  7. Blood - hymenoptera venom panel, tryptase, shellfish panel  8. Immunotherapy?  9. Return to clinic in 6 months or earlier if problem

## 2016-03-02 NOTE — Progress Notes (Signed)
Follow-up Note  Referring Provider: No ref. provider found Primary Provider: Cher Nakai, MD Date of Office Visit: 03/02/2016  Subjective:   Nina Wood (DOB: 11/15/1985) is a 30 y.o. female who returns to the [facility] on 03/02/2016 in re-evaluation of the following:  HPI: Nina Wood presents to this clinic in reevaluation of her allergic state. I have not seen her in his clinic since June 2016 at which time we evaluated her initially for a combination of immunological hyperreactivity states including urticaria, recurrent anaphylaxis, possible food allergy, allergic rhinoconjunctivitis, possible component of asthma, and possible Hymenoptera venom hypersensitivity state.  During the interval she has used her EpiPen 4 times. 2 weeks ago she used her EpiPen for extremes eyes swelling and throat tightness and shortness of breath without any obvious precipitant. During the fall she used her EpiPen after being stung by some type of flying insects. She immediately passed out and developed urticaria. 2 additional time she had global urticaria that did not respond to medical therapy for which she used her EpiPen.   She continues to have breakouts of urticaria about 3 times per week in the face of utilizing her preventative therapy which includes cetirizine, ranitidine, and montelukast. This combination has helped decrease the intensity and frequency route brakes but they're still quite common. She will sometimes increase her cetirizine 30 mg daily.  Her nose is doing relatively well while using her Rhinocort but she still has intermittent flareups depending upon what she gets exposed to and she has problems with eye swelling and eye itching depending on when she gets exposed to.  She presents today to have skin testing performed for Hymenoptera venom allergy but unfortunately she took a Zyrtec. She does not think that she can go more than a day without taking of Zyrtec.  She uses her  short-acting bronchodilator no more than twice a week for episodes of shortness of breath and occasional cough.    Medication List           albuterol 108 (90 Base) MCG/ACT inhaler  Commonly known as:  PROVENTIL HFA;VENTOLIN HFA  Inhale 2 puffs into the lungs every 6 (six) hours as needed for wheezing or shortness of breath.     cetirizine 10 MG tablet  Commonly known as:  ZYRTEC  Take 10 mg by mouth at bedtime.     dicyclomine 10 MG capsule  Commonly known as:  BENTYL  Take 10 mg by mouth 3 (three) times daily as needed for spasms.     EPIPEN IJ  Inject 1 each as directed once.     ferrous sulfate 325 (65 FE) MG tablet  Take 325 mg by mouth daily with breakfast.     HYDROcodone-acetaminophen 5-325 MG tablet  Commonly known as:  NORCO/VICODIN  Take 1-2 tablets by mouth every 6 (six) hours as needed for severe pain.     ibuprofen 600 MG tablet  Commonly known as:  ADVIL,MOTRIN  TAKE 1 TABLET(600 MG) BY MOUTH EVERY 6 HOURS AS NEEDED     montelukast 10 MG tablet  Commonly known as:  SINGULAIR  Take 10 mg by mouth at bedtime.     oxycodone 5 MG capsule  Commonly known as:  OXY-IR  Take 5 mg by mouth every 4 (four) hours as needed (16 day RX).     PAZEO 0.7 % Soln  Generic drug:  Olopatadine HCl  Place 1 drop into both eyes daily. Reported on 03/02/2016  ranitidine 300 MG tablet  Commonly known as:  ZANTAC  Take 300 mg by mouth at bedtime.     traMADol 50 MG tablet  Commonly known as:  ULTRAM  Take 50 mg by mouth every 6 (six) hours as needed for moderate pain.     triamcinolone cream 0.5 %  Commonly known as:  KENALOG  Apply 1 application topically 3 (three) times daily as needed (For rash.).        Past Medical History  Diagnosis Date  . Deep vein thrombosis (DVT) ([place])   . Hip discomfort   . Anemia   . Headache   . Asthma   . Environmental allergies   . Ovarian cyst   . Breast cyst     Past Surgical History  Procedure Laterality Date  .  Appendectomy    . Cesarean section    . Gastric bypass    . Induced abortion    . Etopi    . Ectopic pregnancy surgery    . Ovarian cyst surgery      Allergies  Allergen Reactions  . Penicillins Hives  . Shellfish Allergy Anaphylaxis    Patient is allergic to all seafood.  Marland Kitchen Histamine Other (See Comments)    Patient is tested positive in allergy testing to Control-histamine 1.  . Other Swelling    Mayonnaise causes swelling of the tongue.  Marland Kitchen Peanut Butter Flavor Hives  . Voltaren [Diclofenac] Other (See Comments) and Rash    Volatren gel causes blisters.    Review of systems negative except as noted in HPI / PMHx or noted below:  Review of Systems  Constitutional: Negative.   HENT: Negative.   Eyes: Negative.   Respiratory: Negative.   Cardiovascular: Negative.   Gastrointestinal: Negative.   Genitourinary: Negative.   Musculoskeletal: Negative.   Skin: Negative.   Neurological: Negative.   Endo/Heme/Allergies: Negative.   Psychiatric/Behavioral: Negative.      Objective:   Filed Vitals:   03/02/16 1442  BP: 90/68  Pulse: 88  Resp: 20   Height: 4' 7.39" (140.7 cm)  Weight: 155 lb 6.8 oz (70.5 kg)   Physical Exam  Constitutional: She is well-developed, well-nourished, and in no distress.  HENT:  Head: Normocephalic.  Right Ear: Tympanic membrane, external ear and ear canal normal.  Left Ear: Tympanic membrane, external ear and ear canal normal.  Nose: Nose normal. No mucosal edema or rhinorrhea.  Mouth/Throat: Uvula is midline, oropharynx is clear and moist and mucous membranes are normal. No oropharyngeal exudate.  Eyes: Conjunctivae are normal.  Neck: Trachea normal. No tracheal tenderness present. No tracheal deviation present. No thyromegaly present.  Cardiovascular: Normal rate, regular rhythm, S1 normal, S2 normal and normal heart sounds.   No murmur heard. Pulmonary/Chest: Breath sounds normal. No stridor. No respiratory distress. She has no  wheezes. She has no rales.  Musculoskeletal: She exhibits no edema.  Lymphadenopathy:       Head (right side): No tonsillar adenopathy present.       Head (left side): No tonsillar adenopathy present.    She has no cervical adenopathy.  Neurological: She is alert. Gait normal.  Skin: No rash noted. She is not diaphoretic. No erythema. Nails show no clubbing.  Psychiatric: Mood and affect normal.    Diagnostics:    Spirometry was performed and demonstrated an FEV1 of 2.03 at 103 % of predicted.  The patient had an Asthma Control Test with the following results: ACT Total Score: 19.  Assessment and Plan:   1. Urticaria   2. Allergic rhinoconjunctivitis   3. Anaphylaxis, subsequent encounter   4. Hymenoptera allergy   5. Asthma, mild intermittent, well-controlled     1. Submit for Xolair  2. Continue EpiPen, Benadryl, M.D./ER for allergic reaction  3. Continue combination: montelukast 10 mg, ranitidine 300 mg, cetirizine 10 mg daily  4. Can increase cetirizine to 20-30 mg daily  5. Continue ProAir HFA 2 puffs every 4-6 hours if needed  6. Continue OTC Rhinocort one spray each nostril once a day  7. Blood - hymenoptera venom panel, tryptase, shellfish panel  8. Immunotherapy?  9. Return to clinic in 6 months or earlier if problem  Imaria has a very active immune system and I think she would benefit from Xolair administration and we will see if we can get this arranged as soon as possible. Our previous attempt to get her to use this medication was ineffective taste upon some logistical issues. Hopefully those barriers have been breached and we'll get her on this medication sometime within the next week or so. Because of the extreme overactivity of her immune system and her reaction to hymenoptera venom I think could be worthwhile to rule out the possibility that she has a mast cell activating syndrome and I'll check a tryptase as a screening test. We'll also evaluate her  further for her Hymenoptera venom hypersensitivity state and possible food allergy with the blood tests noted above.  Allena Katz, MD [place]

## 2016-03-20 ENCOUNTER — Encounter: Payer: Self-pay | Admitting: *Deleted

## 2016-03-28 ENCOUNTER — Encounter (HOSPITAL_COMMUNITY): Payer: Self-pay | Admitting: *Deleted

## 2016-03-28 ENCOUNTER — Inpatient Hospital Stay (HOSPITAL_COMMUNITY)
Admission: AD | Admit: 2016-03-28 | Discharge: 2016-03-28 | Disposition: A | Discharge status: Home / Self Care | Payer: PRIVATE HEALTH INSURANCE | Source: Ambulatory Visit | Attending: Obstetrics and Gynecology | Admitting: Obstetrics and Gynecology

## 2016-03-28 ENCOUNTER — Inpatient Hospital Stay (HOSPITAL_COMMUNITY): Payer: PRIVATE HEALTH INSURANCE

## 2016-03-28 DIAGNOSIS — B9689 Other specified bacterial agents as the cause of diseases classified elsewhere: Secondary | ICD-10-CM | POA: Diagnosis not present

## 2016-03-28 DIAGNOSIS — Z880 Allergy status to penicillin: Secondary | ICD-10-CM | POA: Insufficient documentation

## 2016-03-28 DIAGNOSIS — O23591 Infection of other part of genital tract in pregnancy, first trimester: Secondary | ICD-10-CM | POA: Diagnosis not present

## 2016-03-28 DIAGNOSIS — O26899 Other specified pregnancy related conditions, unspecified trimester: Secondary | ICD-10-CM

## 2016-03-28 DIAGNOSIS — Z91010 Allergy to peanuts: Secondary | ICD-10-CM | POA: Insufficient documentation

## 2016-03-28 DIAGNOSIS — Z3A01 Less than 8 weeks gestation of pregnancy: Secondary | ICD-10-CM | POA: Diagnosis not present

## 2016-03-28 DIAGNOSIS — O9989 Other specified diseases and conditions complicating pregnancy, childbirth and the puerperium: Secondary | ICD-10-CM | POA: Diagnosis not present

## 2016-03-28 DIAGNOSIS — R109 Unspecified abdominal pain: Secondary | ICD-10-CM | POA: Diagnosis not present

## 2016-03-28 DIAGNOSIS — O209 Hemorrhage in early pregnancy, unspecified: Secondary | ICD-10-CM | POA: Diagnosis not present

## 2016-03-28 DIAGNOSIS — Z91013 Allergy to seafood: Secondary | ICD-10-CM | POA: Diagnosis not present

## 2016-03-28 DIAGNOSIS — Z888 Allergy status to other drugs, medicaments and biological substances status: Secondary | ICD-10-CM | POA: Diagnosis not present

## 2016-03-28 DIAGNOSIS — Z91018 Allergy to other foods: Secondary | ICD-10-CM | POA: Diagnosis not present

## 2016-03-28 DIAGNOSIS — J45909 Unspecified asthma, uncomplicated: Secondary | ICD-10-CM | POA: Insufficient documentation

## 2016-03-28 DIAGNOSIS — O468X1 Other antepartum hemorrhage, first trimester: Secondary | ICD-10-CM

## 2016-03-28 DIAGNOSIS — O418X10 Other specified disorders of amniotic fluid and membranes, first trimester, not applicable or unspecified: Secondary | ICD-10-CM

## 2016-03-28 LAB — URINALYSIS, ROUTINE W REFLEX MICROSCOPIC
Bilirubin Urine: NEGATIVE
Glucose, UA: NEGATIVE mg/dL
Hgb urine dipstick: NEGATIVE
Ketones, ur: NEGATIVE mg/dL
Leukocytes, UA: NEGATIVE
Nitrite: NEGATIVE
Protein, ur: NEGATIVE mg/dL
Specific Gravity, Urine: 1.015 (ref 1.005–1.030)
pH: 5.5 (ref 5.0–8.0)

## 2016-03-28 LAB — ABO/RH: ABO/RH(D): A POS

## 2016-03-28 LAB — CBC
HCT: 30.7 % — ABNORMAL LOW (ref 36.0–46.0)
Hemoglobin: 10.1 g/dL — ABNORMAL LOW (ref 12.0–15.0)
MCH: 25.8 pg — ABNORMAL LOW (ref 26.0–34.0)
MCHC: 32.9 g/dL (ref 30.0–36.0)
MCV: 78.3 fL (ref 78.0–100.0)
Platelets: 414 10*3/uL — ABNORMAL HIGH (ref 150–400)
RBC: 3.92 MIL/uL (ref 3.87–5.11)
RDW: 14.5 % (ref 11.5–15.5)
WBC: 8.9 10*3/uL (ref 4.0–10.5)

## 2016-03-28 LAB — WET PREP, GENITAL
Sperm: NONE SEEN
Trich, Wet Prep: NONE SEEN
Yeast Wet Prep HPF POC: NONE SEEN

## 2016-03-28 LAB — HCG, QUANTITATIVE, PREGNANCY: hCG, Beta Chain, Quant, S: 3728 m[IU]/mL — ABNORMAL HIGH (ref <5)

## 2016-03-28 LAB — POCT PREGNANCY, URINE: Preg Test, Ur: POSITIVE — AB

## 2016-03-28 MED ORDER — METRONIDAZOLE 0.75 % VA GEL: 1.0000 | Freq: Every day | VAGINAL | Status: DC

## 2016-03-28 NOTE — MAU Note (Signed)
PT  SAYS SHE WORKS  AT [employer]  DID NOT  HAVE  CYCLE  THIS  MTH -  BUT UNSURE OF LMP.     TOOK UPT  AT WORK-    POSITIVE        HAS HAD SPOTTING  ALL WEEK-  THOUGHT  IT  WAS  CYCLE.    NO BLEEDING  NOW.     PAIN STARTED X4 WEEKS- AGO  THOUGHT     WAS CYST.     SAW DR Deatra Ina-     2016.      NO MED  FOR PAIN.

## 2016-03-28 NOTE — MAU Provider Note (Signed)
History     CSN: [id number]  Arrival date and time: 03/28/16 1945   First Provider Initiated Contact with Patient 03/28/16 2104      Chief Complaint  Patient presents with  . Vaginal Bleeding   HPI Ms. Nina Wood is a 30 y.o. [phone] at [redacted]w[redacted]d who presents to MAU today with complaint of abdominal pain and vaginal bleeding. The patient states lower abdominal cramping x 1 month more on the right then left. She has not taken anything for pain. She states bleeding started last night as spotting. It became heavier this morning and is minimal again now. She has had some occasional N/V with only one episode today. She also states loose stools, but denies diarrhea. She also denies UTI symptoms or fever. She has had some clear-yellow discharge.    OB History    Gravida Para Term Preterm AB TAB SAB Ectopic Multiple Living   6 1 1  3 1 1 1  2       Past Medical History  Diagnosis Date  . Deep vein thrombosis (DVT) ([place])   . Hip discomfort   . Anemia   . Headache   . Asthma   . Environmental allergies   . Ovarian cyst   . Breast cyst     Past Surgical History  Procedure Laterality Date  . Appendectomy    . Cesarean section    . Gastric bypass    . Induced abortion    . Etopi    . Ectopic pregnancy surgery    . Ovarian cyst surgery      Family History  Problem Relation Age of Onset  . Cancer Mother   . Hypertension Mother   . Cancer Father   . Hypertension Maternal Uncle   . Diabetes Paternal Aunt   . Cancer Maternal Grandmother     Social History  Substance Use Topics  . Smoking status: Never Smoker   . Smokeless tobacco: None  . Alcohol Use: No    Allergies:  Allergies  Allergen Reactions  . Penicillins Hives    Has patient had a PCN reaction causing immediate rash, facial/tongue/throat swelling, SOB or lightheadedness with hypotension: Yes Has patient had a PCN reaction causing severe rash involving mucus membranes or skin necrosis: No Has patient had a PCN  reaction that required hospitalization No Has patient had a PCN reaction occurring within the last 10 years: Yes If all of the above answers are "NO", then may proceed with Cephalosporin use.    . Shellfish Allergy Anaphylaxis    Patient is allergic to all seafood.  Marland Kitchen Histamine Other (See Comments)    Patient is tested positive in allergy testing to Control-histamine 1.  . Other Swelling    Mayonnaise causes swelling of the tongue.  Marland Kitchen Peanut Butter Flavor Hives  . Voltaren [Diclofenac] Other (See Comments) and Rash    Volatren gel causes blisters.    Prescriptions prior to admission  Medication Sig Dispense Refill Last Dose  . benzonatate (TESSALON) 100 MG capsule Take 100 mg by mouth 3 (three) times daily as needed for cough.   Past Month at Unknown time  . cephALEXin (KEFLEX) 500 MG capsule Take 500 mg by mouth daily.   Past Month at Unknown time  . cetirizine (ZYRTEC) 10 MG tablet Take 20-30 mg by mouth at bedtime.    03/27/2016 at Unknown time  . dicyclomine (BENTYL) 10 MG capsule Take 10 mg by mouth 3 (three) times daily as needed for spasms.  Past Month at Unknown time  . EPINEPHrine (EPIPEN IJ) Inject 1 each as directed once.   Past Month at Unknown time  . ferrous sulfate 325 (65 FE) MG tablet Take 325 mg by mouth daily as needed (low iron).    Past Month at Unknown time  . fluticasone (FLONASE) 50 MCG/ACT nasal spray Place 1 spray into both nostrils daily as needed for allergies or rhinitis.   03/27/2016 at Unknown time  . gabapentin (NEURONTIN) 100 MG capsule Take 100-300 mg by mouth 3 (three) times daily as needed (pain). Depends on pain level if takes 100 mg or 300 mg   2 weeks at Unknown time  . HYDROcodone-acetaminophen (NORCO/VICODIN) 5-325 MG tablet Take 1-2 tablets by mouth every 6 (six) hours as needed for severe pain. 6 tablet 0 Past Week at Unknown time  . meloxicam (MOBIC) 7.5 MG tablet Take 7.5 mg by mouth daily as needed for pain.   Past Week at Unknown time  .  montelukast (SINGULAIR) 10 MG tablet Take 10 mg by mouth at bedtime.   03/27/2016 at Unknown time  . moxifloxacin (VIGAMOX) 0.5 % ophthalmic solution Place 1 drop into both eyes daily.   03/28/2016 at Unknown time  . ranitidine (ZANTAC) 300 MG tablet Take 300 mg by mouth at bedtime.   03/26/2016 at Unknown time  . traMADol (ULTRAM) 50 MG tablet Take 50 mg by mouth every 6 (six) hours as needed for moderate pain.   Past Week at Unknown time  . triamcinolone cream (KENALOG) 0.5 % Apply 1 application topically 3 (three) times daily as needed (For rash.).   03/27/2016 at Unknown time  . albuterol (PROVENTIL HFA;VENTOLIN HFA) 108 (90 BASE) MCG/ACT inhaler Inhale 2 puffs into the lungs every 6 (six) hours as needed for wheezing or shortness of breath.   rescue    Review of Systems  Constitutional: Negative for fever and malaise/fatigue.  Gastrointestinal: Positive for nausea, vomiting and abdominal pain. Negative for diarrhea and constipation.  Genitourinary: Negative for dysuria, urgency and frequency.       + vaginal bleeding, discharge   Physical Exam   Blood pressure 105/52, pulse 86, temperature 98.5 F (36.9 C), temperature source Oral, resp. rate 20, height 4\' 5"  (1.346 m), weight 161 lb 4 oz (73.143 kg), last menstrual period 02/20/2016.  Physical Exam  Nursing note and vitals reviewed. Constitutional: She is oriented to person, place, and time. She appears well-developed and well-nourished. No distress.  HENT:  Head: Normocephalic and atraumatic.  Cardiovascular: Normal rate.   Respiratory: Effort normal.  GI: Soft. She exhibits no distension and no mass. There is tenderness (mild tenderness to palpation of the RLQ). There is no rebound and no guarding.  Genitourinary: Uterus is tender. Uterus is not enlarged. Cervix exhibits no motion tenderness, no discharge and no friability. Right adnexum displays tenderness. Right adnexum displays no mass. Left adnexum displays no mass and no  tenderness. No bleeding in the vagina. Vaginal discharge (small amount of thin, white discharge noted) found.  Neurological: She is alert and oriented to person, place, and time.  Skin: Skin is warm and dry. No erythema.  Psychiatric: She has a normal mood and affect.    Results for orders placed or performed during the hospital encounter of 03/28/16 (from the past 24 hour(s))  Urinalysis, Routine w reflex microscopic (not at [facility])     Status: None   Collection Time: 03/28/16  8:02 PM  Result Value Ref Range   Color, Urine YELLOW  YELLOW   APPearance CLEAR CLEAR   Specific Gravity, Urine 1.015 1.005 - 1.030   pH 5.5 5.0 - 8.0   Glucose, UA NEGATIVE NEGATIVE mg/dL   Hgb urine dipstick NEGATIVE NEGATIVE   Bilirubin Urine NEGATIVE NEGATIVE   Ketones, ur NEGATIVE NEGATIVE mg/dL   Protein, ur NEGATIVE NEGATIVE mg/dL   Nitrite NEGATIVE NEGATIVE   Leukocytes, UA NEGATIVE NEGATIVE  Pregnancy, urine POC     Status: Abnormal   Collection Time: 03/28/16  8:18 PM  Result Value Ref Range   Preg Test, Ur POSITIVE (A) NEGATIVE  CBC     Status: Abnormal   Collection Time: 03/28/16  8:49 PM  Result Value Ref Range   WBC 8.9 4.0 - 10.5 K/uL   RBC 3.92 3.87 - 5.11 MIL/uL   Hemoglobin 10.1 (L) 12.0 - 15.0 g/dL   HCT 30.7 (L) 36.0 - 46.0 %   MCV 78.3 78.0 - 100.0 fL   MCH 25.8 (L) 26.0 - 34.0 pg   MCHC 32.9 30.0 - 36.0 g/dL   RDW 14.5 11.5 - 15.5 %   Platelets 414 (H) 150 - 400 K/uL  ABO/Rh     Status: None   Collection Time: 03/28/16  8:49 PM  Result Value Ref Range   ABO/RH(D) A POS   hCG, quantitative, pregnancy     Status: Abnormal   Collection Time: 03/28/16  8:49 PM  Result Value Ref Range   hCG, Beta Chain, Quant, S 3728 (H) <5 mIU/mL  Wet prep, genital     Status: Abnormal   Collection Time: 03/28/16  9:44 PM  Result Value Ref Range   Yeast Wet Prep HPF POC NONE SEEN NONE SEEN   Trich, Wet Prep NONE SEEN NONE SEEN   Clue Cells Wet Prep HPF POC PRESENT (A) NONE SEEN   WBC, Wet  Prep HPF POC FEW (A) NONE SEEN   Sperm NONE SEEN    US Ob Comp Less 14 Wks  03/28/2016  CLINICAL DATA:  Spotting, pain for 4 weeks EXAM: OBSTETRIC <14 WK [place] AND TRANSVAGINAL OB US TECHNIQUE: Both transabdominal and transvaginal ultrasound examinations were performed for complete evaluation of the gestation as well as the maternal uterus, adnexal regions, and pelvic cul-de-sac. Transvaginal technique was performed to assess early pregnancy. COMPARISON:  None. FINDINGS: Intrauterine gestational sac: Present Yolk sac:  Not present Embryo:  Not present Cardiac Activity: Not present MSD: 6.1  mm   5 w   2  d Subchorionic hemorrhage:  Small subchorionic hemorrhage. Maternal uterus/adnexae: Right ovary is not visualized. Normal left ovary. No adnexal mass. No pelvic free fluid. IMPRESSION: Probable early intrauterine gestational sac, but no yolk sac, fetal pole, or cardiac activity yet visualized. Recommend follow-up quantitative B-HCG levels and follow-up US in 14 days to confirm and assess viability. This recommendation follows SRU consensus guidelines: Diagnostic Criteria for Nonviable Pregnancy Early in the First Trimester. [place] Med [id number]. Electronically Signed   By: Kathreen Devoid   On: 03/28/2016 21:43   US Ob Transvaginal  03/28/2016  CLINICAL DATA:  Spotting, pain for 4 weeks EXAM: OBSTETRIC <14 WK [place] AND TRANSVAGINAL OB US TECHNIQUE: Both transabdominal and transvaginal ultrasound examinations were performed for complete evaluation of the gestation as well as the maternal uterus, adnexal regions, and pelvic cul-de-sac. Transvaginal technique was performed to assess early pregnancy. COMPARISON:  None. FINDINGS: Intrauterine gestational sac: Present Yolk sac:  Not present Embryo:  Not present Cardiac Activity: Not present MSD: 6.1  mm  5 w   2  d Subchorionic hemorrhage:  Small subchorionic hemorrhage. Maternal uterus/adnexae: Right ovary is not visualized. Normal left ovary. No adnexal mass.  No pelvic free fluid. IMPRESSION: Probable early intrauterine gestational sac, but no yolk sac, fetal pole, or cardiac activity yet visualized. Recommend follow-up quantitative B-HCG levels and follow-up US in 14 days to confirm and assess viability. This recommendation follows SRU consensus guidelines: Diagnostic Criteria for Nonviable Pregnancy Early in the First Trimester. [place] [employer] Med [id number]. Electronically Signed   By: Kathreen Devoid   On: 03/28/2016 21:43    MAU Course  Procedures None  MDM +UPT UA, wet prep, GC/chlamydia, CBC, ABO/Rh, quant hCG, HIV, RPR and [place] today to rule out ectopic pregnancy Discussed patient with Dr. Philis Pique. She would like her to follow-up for a second [place] in the office in 1 week Assessment and Plan  A: IUGS without YS or FP Pregnancy of unknown location Bacterial vaginosis  P: Discharge home Rx for Metrogel given to patient  Bleeding/ectopic precautions discussed Patient advised to follow-up with [facility] in 1 week for repeat US or sooner PRN Patient may return to MAU as needed or if her condition were to change or worsen   Luvenia Redden, PA-C  03/28/2016, 10:24 PM

## 2016-03-28 NOTE — Discharge Instructions (Signed)
Subchorionic Hematoma °A subchorionic hematoma is a gathering of blood between the outer wall of the placenta and the inner wall of the womb (uterus). The placenta is the organ that connects the fetus to the wall of the uterus. The placenta performs the feeding, breathing (oxygen to the fetus), and waste removal (excretory work) of the fetus.  °Subchorionic hematoma is the most common abnormality found on a result from ultrasonography done during the first trimester or early second trimester of pregnancy. If there has been little or no vaginal bleeding, early small hematomas usually shrink on their own and do not affect your baby or pregnancy. The blood is gradually absorbed over 1-2 weeks. When bleeding starts later in pregnancy or the hematoma is larger or occurs in an older pregnant woman, the outcome may not be as good. Larger hematomas may get bigger, which increases the chances for miscarriage. Subchorionic hematoma also increases the risk of premature detachment of the placenta from the uterus, preterm (premature) labor, and stillbirth. °HOME CARE INSTRUCTIONS °· Stay on bed rest if your health care provider recommends this. Although bed rest will not prevent more bleeding or prevent a miscarriage, your health care provider may recommend bed rest until you are advised otherwise. °· Avoid heavy lifting (more than 10 lb [4.5 kg]), exercise, sexual intercourse, or douching as directed by your health care provider. °· Keep track of the number of pads you use each day and how soaked (saturated) they are. Write down this information. °· Do not use tampons. °· Keep all follow-up appointments as directed by your health care provider. Your health care provider may ask you to have follow-up blood tests or ultrasound tests or both. °SEEK IMMEDIATE MEDICAL CARE IF: °· You have severe cramps in your stomach, back, abdomen, or pelvis. °· You have a fever. °· You pass large clots or tissue. Save any tissue for your health  care provider to look at. °· Your bleeding increases or you become lightheaded, feel weak, or have fainting episodes. °  °This information is not intended to replace advice given to you by your health care provider. Make sure you discuss any questions you have with your health care provider. °  °Document Released: 02/14/2007 Document Revised: 11/20/2014 Document Reviewed: 05/29/2013 °Elsevier Interactive Patient Education ©2016 [employer] Inc. ° °

## 2016-03-29 LAB — GC/CHLAMYDIA PROBE AMP (~~LOC~~) NOT AT ARMC
Chlamydia: NEGATIVE
Neisseria Gonorrhea: NEGATIVE

## 2016-03-30 LAB — HIV ANTIBODY (ROUTINE TESTING W REFLEX): HIV Screen 4th Generation wRfx: NONREACTIVE

## 2016-04-13 ENCOUNTER — Encounter (HOSPITAL_COMMUNITY): Payer: Self-pay | Admitting: *Deleted

## 2016-04-13 ENCOUNTER — Inpatient Hospital Stay (HOSPITAL_COMMUNITY): Payer: PRIVATE HEALTH INSURANCE

## 2016-04-13 ENCOUNTER — Inpatient Hospital Stay (HOSPITAL_COMMUNITY)
Admission: AD | Admit: 2016-04-13 | Discharge: 2016-04-13 | Disposition: A | Discharge status: Home / Self Care | Payer: PRIVATE HEALTH INSURANCE | Source: Ambulatory Visit | Attending: Obstetrics and Gynecology | Admitting: Obstetrics and Gynecology

## 2016-04-13 DIAGNOSIS — O209 Hemorrhage in early pregnancy, unspecified: Secondary | ICD-10-CM | POA: Insufficient documentation

## 2016-04-13 DIAGNOSIS — Z880 Allergy status to penicillin: Secondary | ICD-10-CM | POA: Insufficient documentation

## 2016-04-13 DIAGNOSIS — O99511 Diseases of the respiratory system complicating pregnancy, first trimester: Secondary | ICD-10-CM | POA: Diagnosis not present

## 2016-04-13 DIAGNOSIS — Z91013 Allergy to seafood: Secondary | ICD-10-CM | POA: Diagnosis not present

## 2016-04-13 DIAGNOSIS — Z888 Allergy status to other drugs, medicaments and biological substances status: Secondary | ICD-10-CM | POA: Insufficient documentation

## 2016-04-13 DIAGNOSIS — Z8249 Family history of ischemic heart disease and other diseases of the circulatory system: Secondary | ICD-10-CM | POA: Insufficient documentation

## 2016-04-13 DIAGNOSIS — Z79899 Other long term (current) drug therapy: Secondary | ICD-10-CM | POA: Insufficient documentation

## 2016-04-13 DIAGNOSIS — Z86718 Personal history of other venous thrombosis and embolism: Secondary | ICD-10-CM | POA: Diagnosis not present

## 2016-04-13 DIAGNOSIS — O99841 Bariatric surgery status complicating pregnancy, first trimester: Secondary | ICD-10-CM | POA: Diagnosis not present

## 2016-04-13 DIAGNOSIS — J45909 Unspecified asthma, uncomplicated: Secondary | ICD-10-CM | POA: Insufficient documentation

## 2016-04-13 DIAGNOSIS — Z3A01 Less than 8 weeks gestation of pregnancy: Secondary | ICD-10-CM | POA: Insufficient documentation

## 2016-04-13 DIAGNOSIS — O468X1 Other antepartum hemorrhage, first trimester: Secondary | ICD-10-CM

## 2016-04-13 DIAGNOSIS — Z7901 Long term (current) use of anticoagulants: Secondary | ICD-10-CM | POA: Insufficient documentation

## 2016-04-13 DIAGNOSIS — O418X10 Other specified disorders of amniotic fluid and membranes, first trimester, not applicable or unspecified: Secondary | ICD-10-CM

## 2016-04-13 LAB — CBC
HCT: 30.7 % — ABNORMAL LOW (ref 36.0–46.0)
Hemoglobin: 10.2 g/dL — ABNORMAL LOW (ref 12.0–15.0)
MCH: 25.6 pg — ABNORMAL LOW (ref 26.0–34.0)
MCHC: 33.2 g/dL (ref 30.0–36.0)
MCV: 76.9 fL — ABNORMAL LOW (ref 78.0–100.0)
Platelets: 361 10*3/uL (ref 150–400)
RBC: 3.99 MIL/uL (ref 3.87–5.11)
RDW: 14.5 % (ref 11.5–15.5)
WBC: 8.9 10*3/uL (ref 4.0–10.5)

## 2016-04-13 LAB — URINALYSIS, ROUTINE W REFLEX MICROSCOPIC
Bilirubin Urine: NEGATIVE
Glucose, UA: NEGATIVE mg/dL
Ketones, ur: NEGATIVE mg/dL
Leukocytes, UA: NEGATIVE
Nitrite: NEGATIVE
Protein, ur: NEGATIVE mg/dL
Specific Gravity, Urine: >1.03 — ABNORMAL HIGH (ref 1.005–1.030)
pH: 5.5 (ref 5.0–8.0)

## 2016-04-13 LAB — URINE MICROSCOPIC-ADD ON

## 2016-04-13 LAB — HCG, QUANTITATIVE, PREGNANCY: hCG, Beta Chain, Quant, S: 97108 m[IU]/mL — ABNORMAL HIGH (ref <5)

## 2016-04-13 NOTE — MAU Note (Signed)
Pt reprots she  Started passing clots yesterday. Stated she passed some grey colored tissue/clots. C/o of abd pain and cramping.

## 2016-04-13 NOTE — MAU Provider Note (Signed)
History     CSN: [id number]  Arrival date and time: 04/13/16 [id number]   First Provider Initiated Contact with Patient 04/13/16 1948      Chief Complaint  Patient presents with  . Vaginal Bleeding   HPI Comments: [id number] @[redacted]w[redacted]d  by LMP c/o passing large blood clots yesterday and low abd cramping. Reports passing some gray tissue today. Bleeding has been scant today. Reports intermittent VB for several weeks.    OB History    Gravida Para Term Preterm AB TAB SAB Ectopic Multiple Living   6 1 1  3 1 1 1  2       Past Medical History  Diagnosis Date  . Deep vein thrombosis (DVT) ([place])   . Hip discomfort   . Anemia   . Headache   . Asthma   . Environmental allergies   . Ovarian cyst   . Breast cyst     Past Surgical History  Procedure Laterality Date  . Appendectomy    . Cesarean section    . Gastric bypass    . Induced abortion    . Etopi    . Ectopic pregnancy surgery    . Ovarian cyst surgery      Family History  Problem Relation Age of Onset  . Cancer Mother   . Hypertension Mother   . Cancer Father   . Hypertension Maternal Uncle   . Diabetes Paternal Aunt   . Cancer Maternal Grandmother     Social History  Substance Use Topics  . Smoking status: Never Smoker   . Smokeless tobacco: Never Used  . Alcohol Use: No    Allergies:  Allergies  Allergen Reactions  . Penicillins Hives    Has patient had a PCN reaction causing immediate rash, facial/tongue/throat swelling, SOB or lightheadedness with hypotension: Yes Has patient had a PCN reaction causing severe rash involving mucus membranes or skin necrosis: No Has patient had a PCN reaction that required hospitalization No Has patient had a PCN reaction occurring within the last 10 years: Yes If all of the above answers are "NO", then may proceed with Cephalosporin use.    . Shellfish Allergy Anaphylaxis    Patient is allergic to all seafood.  Marland Kitchen Histamine Other (See Comments)    Patient is tested positive  in allergy testing to Control-histamine 1.  . Other Swelling    Mayonnaise causes swelling of the tongue.  Marland Kitchen Peanut Butter Flavor Hives  . Voltaren [Diclofenac] Other (See Comments) and Rash    Volatren gel causes blisters.    Prescriptions prior to admission  Medication Sig Dispense Refill Last Dose  . albuterol (PROVENTIL HFA;VENTOLIN HFA) 108 (90 BASE) MCG/ACT inhaler Inhale 2 puffs into the lungs every 6 (six) hours as needed for wheezing or shortness of breath.   Past Month at Unknown time  . cetirizine (ZYRTEC) 10 MG tablet Take 10-30 mg by mouth at bedtime.    04/12/2016 at Unknown time  . dicyclomine (BENTYL) 10 MG capsule Take 10 mg by mouth 3 (three) times daily as needed for spasms.   prn  . enoxaparin (LOVENOX) 40 MG/0.4ML injection Inject 40 mg into the skin daily.   04/13/2016 at 1630  . EPINEPHrine (EPIPEN IJ) Inject 1 each as directed once.   rescue  . ferrous sulfate 325 (65 FE) MG tablet Take 325 mg by mouth daily as needed (low iron).    prn  . fluticasone (FLONASE) 50 MCG/ACT nasal spray Place 1 spray into both nostrils daily  as needed for allergies or rhinitis.   04/12/2016 at Unknown time  . gabapentin (NEURONTIN) 100 MG capsule Take 100-300 mg by mouth 3 (three) times daily as needed (pain). Depends on pain level if takes 100 mg or 300 mg   prn  . Hyprom-Naphaz-Polysorb-Zn Sulf (CLEAR EYES COMPLETE OP) Apply 1 drop to eye 2 (two) times daily as needed (for dryness and irritation).   Past Week at Unknown time  . montelukast (SINGULAIR) 10 MG tablet Take 10 mg by mouth at bedtime.   Past Week at Unknown time  . moxifloxacin (VIGAMOX) 0.5 % ophthalmic solution Place 1 drop into both eyes daily as needed (eye irritation).    prn  . ranitidine (ZANTAC) 300 MG tablet Take 300 mg by mouth at bedtime.   Past Week at Unknown time  . triamcinolone cream (KENALOG) 0.5 % Apply 1 application topically 3 (three) times daily as needed (For rash.).   Past Month at Unknown time    Review  of Systems  Constitutional: Negative.   Cardiovascular: Negative.   Genitourinary: Negative.   Skin: Negative.    Physical Exam   Blood pressure 111/69, pulse 71, temperature 98.7 F (37.1 C), resp. rate 18, height 4\' 5"  (1.346 m), weight 161 lb (73.029 kg), last menstrual period 02/20/2016.  Physical Exam  Constitutional: She is oriented to person, place, and time. She appears well-developed and well-nourished.  HENT:  Head: Normocephalic and atraumatic.  Neck: Normal range of motion.  Cardiovascular: Normal rate.   Respiratory: Effort normal.  GI: Soft. She exhibits no distension. There is no tenderness. There is no rebound.  Genitourinary:  Speculum: scant brown discharge, os closed Bimanual: no tenderness or masses, closed/long  Musculoskeletal: Normal range of motion.  Neurological: She is alert and oriented to person, place, and time.  Skin: Skin is warm and dry.  Psychiatric: She has a normal mood and affect.    MAU Course  Procedures  MDM Sono Results for orders placed or performed during the hospital encounter of 04/13/16 (from the past 48 hour(s))  Urinalysis, Routine w reflex microscopic (not at [facility])     Status: Abnormal   Collection Time: 04/13/16  7:15 PM  Result Value Ref Range   Color, Urine YELLOW YELLOW   APPearance CLEAR CLEAR   Specific Gravity, Urine >1.030 (H) 1.005 - 1.030   pH 5.5 5.0 - 8.0   Glucose, UA NEGATIVE NEGATIVE mg/dL   Hgb urine dipstick TRACE (A) NEGATIVE   Bilirubin Urine NEGATIVE NEGATIVE   Ketones, ur NEGATIVE NEGATIVE mg/dL   Protein, ur NEGATIVE NEGATIVE mg/dL   Nitrite NEGATIVE NEGATIVE   Leukocytes, UA NEGATIVE NEGATIVE  Urine microscopic-add on     Status: Abnormal   Collection Time: 04/13/16  7:15 PM  Result Value Ref Range   Squamous Epithelial / LPF 0-5 (A) NONE SEEN   WBC, UA 0-5 0 - 5 WBC/hpf   RBC / HPF 0-5 0 - 5 RBC/hpf   Bacteria, UA MANY (A) NONE SEEN   Urine-Other MUCOUS PRESENT   CBC     Status: Abnormal    Collection Time: 04/13/16  8:21 PM  Result Value Ref Range   WBC 8.9 4.0 - 10.5 K/uL   RBC 3.99 3.87 - 5.11 MIL/uL   Hemoglobin 10.2 (L) 12.0 - 15.0 g/dL   HCT 30.7 (L) 36.0 - 46.0 %   MCV 76.9 (L) 78.0 - 100.0 fL   MCH 25.6 (L) 26.0 - 34.0 pg   MCHC 33.2 30.0 -  36.0 g/dL   RDW 14.5 11.5 - 15.5 %   Platelets 361 150 - 400 K/uL  hCG, quantitative, pregnancy     Status: Abnormal   Collection Time: 04/13/16  8:21 PM  Result Value Ref Range   hCG, Beta Chain, Quant, S 97108 (H) <5 mIU/mL    Comment:          GEST. AGE      CONC.  (mIU/mL)   <=1 WEEK        5 - 50     2 WEEKS       50 - 500     3 WEEKS       100 - 10,000     4 WEEKS     1,000 - 30,000     5 WEEKS     3,500 - 115,000   6-8 WEEKS     12,000 - 270,000    12 WEEKS     15,000 - 220,000        FEMALE AND NON-PREGNANT FEMALE:     LESS THAN 5 mIU/mL   US Ob Transvaginal  04/13/2016  CLINICAL DATA:  Pregnant patient in first-trimester pregnancy with vaginal bleeding. EXAM: TRANSVAGINAL OB ULTRASOUND TECHNIQUE: Transvaginal ultrasound was performed for complete evaluation of the gestation as well as the maternal uterus, adnexal regions, and pelvic cul-de-sac. COMPARISON:  Obstetric ultrasound 2 weeks prior 03/28/2016 FINDINGS: Intrauterine gestational sac: Present. Yolk sac:  Present. Embryo:  Present. Cardiac Activity: Present. Heart Rate: 148 bpm CRL:   12.1  mm   7 w 3 d                  [place] EDC: 11/27/2016 Subchorionic hemorrhage:  Small, decreased in size from prior. Maternal uterus/adnexae: Neither ovary is visualized. There is no pelvic free fluid. IMPRESSION: Single live intrauterine pregnancy estimated gestational age [redacted] weeks 3 days for estimated date of delivery 11/27/2016. Small subchorionic hemorrhage has decreased in size from prior. Electronically Signed   By: Jeb Levering M.D.   On: 04/13/2016 21:13   Assessment and Plan  Bleeding in pregnancy  [facility] Viable IUP [redacted]w[redacted]d F/U with [place],  NP   Julianne Handler 04/13/2016, 7:51 PM

## 2016-04-13 NOTE — MAU Note (Signed)
Given a pitcher of water and enc'd to drink.

## 2016-04-13 NOTE — Discharge Instructions (Signed)

## 2016-04-13 NOTE — MAU Note (Signed)
Assumed care of patient.

## 2016-04-18 ENCOUNTER — Other Ambulatory Visit: Payer: Self-pay | Admitting: Obstetrics and Gynecology

## 2016-05-12 ENCOUNTER — Other Ambulatory Visit: Payer: Self-pay

## 2016-05-12 DIAGNOSIS — D689 Coagulation defect, unspecified: Secondary | ICD-10-CM

## 2016-05-12 DIAGNOSIS — N939 Abnormal uterine and vaginal bleeding, unspecified: Secondary | ICD-10-CM

## 2016-05-12 DIAGNOSIS — T457X1A Poisoning by anticoagulant antagonists, vitamin K and other coagulants, accidental (unintentional), initial encounter: Secondary | ICD-10-CM

## 2016-05-12 MED ORDER — MONTELUKAST SODIUM 10 MG PO TABS: 10.0000 mg | ORAL_TABLET | Freq: Every day | ORAL | Status: DC

## 2016-05-17 ENCOUNTER — Inpatient Hospital Stay (HOSPITAL_COMMUNITY): Payer: PRIVATE HEALTH INSURANCE

## 2016-05-17 ENCOUNTER — Inpatient Hospital Stay (HOSPITAL_COMMUNITY)
Admission: AD | Admit: 2016-05-17 | Discharge: 2016-05-17 | Disposition: A | Discharge status: Home / Self Care | Payer: PRIVATE HEALTH INSURANCE | Source: Ambulatory Visit | Attending: Obstetrics and Gynecology | Admitting: Obstetrics and Gynecology

## 2016-05-17 ENCOUNTER — Encounter (HOSPITAL_COMMUNITY): Payer: Self-pay | Admitting: *Deleted

## 2016-05-17 DIAGNOSIS — Z880 Allergy status to penicillin: Secondary | ICD-10-CM | POA: Insufficient documentation

## 2016-05-17 DIAGNOSIS — J45909 Unspecified asthma, uncomplicated: Secondary | ICD-10-CM | POA: Insufficient documentation

## 2016-05-17 DIAGNOSIS — Z7901 Long term (current) use of anticoagulants: Secondary | ICD-10-CM | POA: Diagnosis not present

## 2016-05-17 DIAGNOSIS — Z91013 Allergy to seafood: Secondary | ICD-10-CM | POA: Insufficient documentation

## 2016-05-17 DIAGNOSIS — Z91010 Allergy to peanuts: Secondary | ICD-10-CM | POA: Insufficient documentation

## 2016-05-17 DIAGNOSIS — Z79899 Other long term (current) drug therapy: Secondary | ICD-10-CM | POA: Diagnosis not present

## 2016-05-17 DIAGNOSIS — Z888 Allergy status to other drugs, medicaments and biological substances status: Secondary | ICD-10-CM | POA: Insufficient documentation

## 2016-05-17 DIAGNOSIS — O034 Incomplete spontaneous abortion without complication: Secondary | ICD-10-CM

## 2016-05-17 DIAGNOSIS — O074 Failed attempted termination of pregnancy without complication: Secondary | ICD-10-CM

## 2016-05-17 DIAGNOSIS — O209 Hemorrhage in early pregnancy, unspecified: Secondary | ICD-10-CM

## 2016-05-17 DIAGNOSIS — Z3A10 10 weeks gestation of pregnancy: Secondary | ICD-10-CM | POA: Insufficient documentation

## 2016-05-17 DIAGNOSIS — N939 Abnormal uterine and vaginal bleeding, unspecified: Secondary | ICD-10-CM | POA: Diagnosis present

## 2016-05-17 LAB — CBC
HCT: 32.8 % — ABNORMAL LOW (ref 36.0–46.0)
Hemoglobin: 10.8 g/dL — ABNORMAL LOW (ref 12.0–15.0)
MCH: 25.6 pg — ABNORMAL LOW (ref 26.0–34.0)
MCHC: 32.9 g/dL (ref 30.0–36.0)
MCV: 77.7 fL — ABNORMAL LOW (ref 78.0–100.0)
Platelets: 403 10*3/uL — ABNORMAL HIGH (ref 150–400)
RBC: 4.22 MIL/uL (ref 3.87–5.11)
RDW: 14.9 % (ref 11.5–15.5)
WBC: 8.6 10*3/uL (ref 4.0–10.5)

## 2016-05-17 LAB — URINE MICROSCOPIC-ADD ON

## 2016-05-17 LAB — URINALYSIS, ROUTINE W REFLEX MICROSCOPIC
Bilirubin Urine: NEGATIVE
Glucose, UA: NEGATIVE mg/dL
Ketones, ur: NEGATIVE mg/dL
Leukocytes, UA: NEGATIVE
Nitrite: NEGATIVE
Protein, ur: NEGATIVE mg/dL
Specific Gravity, Urine: 1.01 (ref 1.005–1.030)
pH: 6.5 (ref 5.0–8.0)

## 2016-05-17 LAB — POCT PREGNANCY, URINE: Preg Test, Ur: POSITIVE — AB

## 2016-05-17 MED ORDER — OXYCODONE-ACETAMINOPHEN 5-325 MG PO TABS
1.0000 | ORAL_TABLET | Freq: Once | ORAL | Status: AC
  Administered 2016-05-17: 1 via ORAL
  Filled 2016-05-17: qty 1

## 2016-05-17 MED ORDER — KETOROLAC TROMETHAMINE 60 MG/2ML IM SOLN
60.0000 mg | Freq: Once | INTRAMUSCULAR | Status: AC
  Administered 2016-05-17: 60 mg via INTRAMUSCULAR
  Filled 2016-05-17: qty 2

## 2016-05-17 NOTE — MAU Provider Note (Signed)
History     CSN: [id number]  Arrival date and time: 05/17/16 1337   None     Chief Complaint  Patient presents with  . Vaginal Bleeding  . Abdominal Pain   HPI   Ms.Nina Wood is a 30 y.o. female [phone] here with vaginal bleeding and abdominal pain. The patient says she had recent SAB.  She was seen in [place] last Wednesday for vaginal bleeding in pregnancy, she was [redacted] weeks pregnant.  She was told that the baby did not have fetal heart rate, and they performed a D&C that day. She had a recent US that showed a viable pregnancy.  She is here with pain in her left and right lower quadrant, the pain started immediately after the surgery. The pain is constant. She has been taking hydrocodone and ibuprofen without relief. She is also concerned about her vaginal bleeding.  In a 12 hour period period she is going thru 6 pads per day, she has also seen blood clots.   When she left the hospital her hgb was 9.2. She is concerned about anemia.   OB History    Gravida Para Term Preterm AB TAB SAB Ectopic Multiple Living   5 2 2  3 1 1 1  2       Past Medical History  Diagnosis Date  . Deep vein thrombosis (DVT) ([place])   . Hip discomfort   . Anemia   . Headache   . Asthma   . Environmental allergies   . Ovarian cyst   . Breast cyst     Past Surgical History  Procedure Laterality Date  . Appendectomy    . Cesarean section    . Gastric bypass    . Induced abortion    . Etopi    . Ectopic pregnancy surgery    . Ovarian cyst surgery      Family History  Problem Relation Age of Onset  . Cancer Mother   . Hypertension Mother   . Cancer Father   . Hypertension Maternal Uncle   . Diabetes Paternal Aunt   . Cancer Maternal Grandmother     Social History  Substance Use Topics  . Smoking status: Never Smoker   . Smokeless tobacco: Never Used  . Alcohol Use: No    Allergies:  Allergies  Allergen Reactions  . Penicillins Hives    Has patient had a PCN reaction causing  immediate rash, facial/tongue/throat swelling, SOB or lightheadedness with hypotension: Yes Has patient had a PCN reaction causing severe rash involving mucus membranes or skin necrosis: No Has patient had a PCN reaction that required hospitalization No Has patient had a PCN reaction occurring within the last 10 years: Yes If all of the above answers are "NO", then may proceed with Cephalosporin use.    . Shellfish Allergy Anaphylaxis    Patient is allergic to all seafood.  Marland Kitchen Histamine Other (See Comments)    Patient is tested positive in allergy testing to Control-histamine 1.  . Other Swelling    Mayonnaise causes swelling of the tongue.  Marland Kitchen Peanut Butter Flavor Hives  . Voltaren [Diclofenac] Other (See Comments) and Rash    Volatren gel causes blisters.    Prescriptions prior to admission  Medication Sig Dispense Refill Last Dose  . albuterol (PROVENTIL HFA;VENTOLIN HFA) 108 (90 BASE) MCG/ACT inhaler Inhale 2 puffs into the lungs every 6 (six) hours as needed for wheezing or shortness of breath.   Past Month at Unknown time  .  cetirizine (ZYRTEC) 10 MG tablet Take 10-30 mg by mouth at bedtime.    04/12/2016 at Unknown time  . dicyclomine (BENTYL) 10 MG capsule Take 10 mg by mouth 3 (three) times daily as needed for spasms.   prn  . enoxaparin (LOVENOX) 40 MG/0.4ML injection Inject 40 mg into the skin daily.   04/13/2016 at 1630  . EPINEPHrine (EPIPEN IJ) Inject 1 each as directed once.   rescue  . ferrous sulfate 325 (65 FE) MG tablet Take 325 mg by mouth daily as needed (low iron).    prn  . fluticasone (FLONASE) 50 MCG/ACT nasal spray Place 1 spray into both nostrils daily as needed for allergies or rhinitis.   04/12/2016 at Unknown time  . gabapentin (NEURONTIN) 100 MG capsule Take 100-300 mg by mouth 3 (three) times daily as needed (pain). Depends on pain level if takes 100 mg or 300 mg   prn  . Hyprom-Naphaz-Polysorb-Zn Sulf (CLEAR EYES COMPLETE OP) Apply 1 drop to eye 2 (two) times  daily as needed (for dryness and irritation).   Past Week at Unknown time  . montelukast (SINGULAIR) 10 MG tablet Take 1 tablet (10 mg total) by mouth daily. 30 tablet 3   . moxifloxacin (VIGAMOX) 0.5 % ophthalmic solution Place 1 drop into both eyes daily as needed (eye irritation).    prn  . ranitidine (ZANTAC) 300 MG tablet Take 300 mg by mouth at bedtime.   Past Week at Unknown time  . triamcinolone cream (KENALOG) 0.5 % Apply 1 application topically 3 (three) times daily as needed (For rash.).   Past Month at Unknown time   Results for orders placed or performed during the hospital encounter of 05/17/16 (from the past 48 hour(s))  Urinalysis, Routine w reflex microscopic (not at [facility])     Status: Abnormal   Collection Time: 05/17/16  1:42 PM  Result Value Ref Range   Color, Urine YELLOW YELLOW   APPearance HAZY (A) CLEAR   Specific Gravity, Urine 1.010 1.005 - 1.030   pH 6.5 5.0 - 8.0   Glucose, UA NEGATIVE NEGATIVE mg/dL   Hgb urine dipstick LARGE (A) NEGATIVE   Bilirubin Urine NEGATIVE NEGATIVE   Ketones, ur NEGATIVE NEGATIVE mg/dL   Protein, ur NEGATIVE NEGATIVE mg/dL   Nitrite NEGATIVE NEGATIVE   Leukocytes, UA NEGATIVE NEGATIVE  Urine microscopic-add on     Status: Abnormal   Collection Time: 05/17/16  1:42 PM  Result Value Ref Range   Squamous Epithelial / LPF 6-30 (A) NONE SEEN   WBC, UA 0-5 0 - 5 WBC/hpf   RBC / HPF 0-5 0 - 5 RBC/hpf   Bacteria, UA MANY (A) NONE SEEN  Pregnancy, urine POC     Status: Abnormal   Collection Time: 05/17/16  2:03 PM  Result Value Ref Range   Preg Test, Ur POSITIVE (A) NEGATIVE    Comment:        THE SENSITIVITY OF THIS METHODOLOGY IS >24 mIU/mL   CBC     Status: Abnormal   Collection Time: 05/17/16  2:20 PM  Result Value Ref Range   WBC 8.6 4.0 - 10.5 K/uL   RBC 4.22 3.87 - 5.11 MIL/uL   Hemoglobin 10.8 (L) 12.0 - 15.0 g/dL   HCT 32.8 (L) 36.0 - 46.0 %   MCV 77.7 (L) 78.0 - 100.0 fL   MCH 25.6 (L) 26.0 - 34.0 pg   MCHC 32.9 30.0  - 36.0 g/dL   RDW 14.9 11.5 - 15.5 %  Platelets 403 (H) 150 - 400 K/uL   US Ob Transvaginal  05/17/2016  CLINICAL DATA:  Recent spontaneous abortion, status post D&C 1 week ago, heavy bleeding EXAM: TRANSVAGINAL OB ULTRASOUND TECHNIQUE: Transvaginal ultrasound was performed for complete evaluation of the gestation as well as the maternal uterus, adnexal regions, and pelvic cul-de-sac. COMPARISON:  04/13/2016 FINDINGS: Intrauterine gestational sac: None Subchorionic hemorrhage:  None visualized. Maternal uterus/adnexae: Endometrium is thickened/ heterogeneous, measuring up to 2.6 cm, with associated vascularity. Bilateral ovaries are not discretely visualized. No free fluid. IMPRESSION: Heterogeneous/thickened endometrium, measuring up to 2.6 cm, with associated vascularity. This appearance is suspicious for retained products of conception. These results will be called to the ordering clinician or representative by the Radiologist Assistant, and communication documented in the PACS or zVision Dashboard. Electronically Signed   By: Julian Hy M.D.   On: 05/17/2016 16:22    Review of Systems  Constitutional: Negative for fever and chills.  Gastrointestinal: Positive for abdominal pain.  Neurological: Positive for dizziness (Occasional ) and weakness.   Physical Exam   Blood pressure 127/71, pulse 86, temperature 97.9 F (36.6 C), temperature source Oral, resp. rate 18, height 4' 7.5" (1.41 m), weight 155 lb (70.308 kg), last menstrual period 02/20/2016, unknown if currently breastfeeding.  Physical Exam  Constitutional: She is oriented to person, place, and time. She appears well-developed and well-nourished. No distress.  HENT:  Head: Normocephalic.  Eyes: Pupils are equal, round, and reactive to light.  Neck: Neck supple.  GI: Soft. She exhibits no distension. There is generalized tenderness. There is no rigidity, no rebound and no guarding.  Genitourinary:  Speculum exam: Vagina -  Small amount of dark red blood in the vagina. No pooling.  Cervix - + small amount of active, dark red blood oozing from the vagina.  Bimanual exam: Cervix closed Uterus non tender, slightly enlarged  Chaperone present for exam.  Musculoskeletal: Normal range of motion.  Neurological: She is alert and oriented to person, place, and time.  Skin: Skin is warm. She is not diaphoretic.  Psychiatric: Her mood appears anxious. Her affect is angry and blunt. She is agitated.    MAU Course  Procedures  None  MDM  CBC Toradol 60 mg IM Patient says pain is starting to return following [place], percocet 1 tab given Discussed patient with Dr. Philis Pique @ [phone] Dr.Horvath would like the patient to return tomorrow at 1045 for D&E for retained products of conception.   A positive blood type   Assessment and Plan   A:  1. Retained products of conception following abortion   2. Vaginal bleeding in pregnancy, first trimester     P:  Discharge home in stable condition Patient on the OR schedule tomorrow: patient instructed to arrive at 1045 NPO past midnight Bleeding precautions Return to MAU if symptoms worsen Pelvic rest Patient has pain medication Rx at home.    Lezlie Lye, NP  05/17/2016 5:33 PM

## 2016-05-17 NOTE — MAU Note (Addendum)
Had miscarriage and D&C last Wednesday. States 2 days after was still having a lot of bleeding and clots, fever. Bleeding has continued. 2 nights ago, felt weak, felt like she was going to pass out. Has been taking oxycodone for pain. States pain is everyday in R side and sometimes on L. Today she has felt better. Has taken ibuprofen. Was advised by MD to come in to MAU if she felt like she could not wait till appt on Monday. States surgery was done at [facility], but she is a patient at [facility] and is to F/U there with Dr. Philis Pique.

## 2016-05-17 NOTE — Discharge Instructions (Signed)
Dilation and Curettage or Vacuum Curettage Dilation and curettage (D&C) and vacuum curettage are minor procedures. A D&C involves stretching (dilation) the cervix and scraping (curettage) the inside lining of the womb (uterus). During a D&C, tissue is gently scraped from the inside lining of the uterus. During a vacuum curettage, the lining and tissue in the uterus are removed with the use of gentle suction.  Curettage may be performed to either diagnose or treat a problem. As a diagnostic procedure, curettage is performed to examine tissues from the uterus. A diagnostic curettage may be performed for the following symptoms:   Irregular bleeding in the uterus.   Bleeding with the development of clots.   Spotting between menstrual periods.   Prolonged menstrual periods.   Bleeding after menopause.   No menstrual period (amenorrhea).   A change in size and shape of the uterus.  As a treatment procedure, curettage may be performed for the following reasons:   Removal of an IUD (intrauterine device).   Removal of retained placenta after giving birth. Retained placenta can cause an infection or bleeding severe enough to require transfusions.   Abortion.   Miscarriage.   Removal of polyps inside the uterus.   Removal of uncommon types of noncancerous lumps (fibroids).  LET [facility] CARE PROVIDER KNOW ABOUT:   Any allergies you have.   All medicines you are taking, including vitamins, herbs, eye drops, creams, and over-the-counter medicines.   Previous problems you or members of your family have had with the use of anesthetics.   Any blood disorders you have.   Previous surgeries you have had.   Medical conditions you have. RISKS AND COMPLICATIONS  Generally, this is a safe procedure. However, as with any procedure, complications can occur. Possible complications include:  Excessive bleeding.   Infection of the uterus.   Damage to the cervix.    Development of scar tissue (adhesions) inside the uterus, later causing abnormal amounts of menstrual bleeding.   Complications from the general anesthetic, if a general anesthetic is used.   Putting a hole (perforation) in the uterus. This is rare.  BEFORE THE PROCEDURE   Eat and drink before the procedure only as directed by your health care provider.   Arrange for someone to take you home.  PROCEDURE  This procedure usually takes about 15-30 minutes.  You will be given one of the following:  A medicine that numbs the area in and around the cervix (local anesthetic).   A medicine to make you sleep through the procedure (general anesthetic).  You will lie on your back with your legs in stirrups.   A warm metal or plastic instrument (speculum) will be placed in your vagina to keep it open and to allow the health care provider to see the cervix.  There are two ways in which your cervix can be softened and dilated. These include:   Taking a medicine.   Having thin rods (laminaria) inserted into your cervix.   A curved tool (curette) will be used to scrape cells from the inside lining of the uterus. In some cases, gentle suction is applied with the curette. The curette will then be removed.  AFTER THE PROCEDURE   You will rest in the recovery area until you are stable and are ready to go home.   You may feel sick to your stomach (nauseous) or throw up (vomit) if you were given a general anesthetic.   You may have a sore throat if a tube  was placed in your throat during general anesthesia.   You may have light cramping and bleeding. This may last for 2 days to 2 weeks after the procedure.   Your uterus needs to make a new lining after the procedure. This may make your next period late.   This information is not intended to replace advice given to you by your health care provider. Make sure you discuss any questions you have with your health care provider.    Document Released: 10/30/2005 Document Revised: 07/02/2013 Document Reviewed: 05/29/2013 Elsevier Interactive Patient Education [employer].

## 2016-05-17 NOTE — MAU Note (Signed)
Release of information fax'd to [facility]. Patient going to OR 05/18/2016 @ 1015.

## 2016-05-18 ENCOUNTER — Encounter (HOSPITAL_COMMUNITY)
Admission: RE | Disposition: A | Discharge status: Home / Self Care | Payer: Self-pay | Source: Ambulatory Visit | Attending: Obstetrics and Gynecology

## 2016-05-18 ENCOUNTER — Ambulatory Visit (HOSPITAL_COMMUNITY)
Admission: RE | Admit: 2016-05-18 | Discharge: 2016-05-18 | Disposition: A | Discharge status: Home / Self Care | Payer: PRIVATE HEALTH INSURANCE | Source: Ambulatory Visit | Attending: Obstetrics and Gynecology | Admitting: Obstetrics and Gynecology

## 2016-05-18 ENCOUNTER — Ambulatory Visit (HOSPITAL_COMMUNITY): Payer: PRIVATE HEALTH INSURANCE

## 2016-05-18 ENCOUNTER — Encounter (HOSPITAL_COMMUNITY): Payer: Self-pay | Admitting: *Deleted

## 2016-05-18 ENCOUNTER — Ambulatory Visit (HOSPITAL_COMMUNITY): Payer: PRIVATE HEALTH INSURANCE | Admitting: Anesthesiology

## 2016-05-18 ENCOUNTER — Other Ambulatory Visit: Payer: Self-pay | Admitting: Obstetrics and Gynecology

## 2016-05-18 DIAGNOSIS — O034 Incomplete spontaneous abortion without complication: Secondary | ICD-10-CM | POA: Diagnosis not present

## 2016-05-18 DIAGNOSIS — M549 Dorsalgia, unspecified: Secondary | ICD-10-CM

## 2016-05-18 HISTORY — PX: DILATION AND EVACUATION: SHX1459

## 2016-05-18 SURGERY — DILATION AND EVACUATION, UTERUS
Anesthesia: General | Laterality: Bilateral

## 2016-05-18 MED ORDER — SUCCINYLCHOLINE CHLORIDE 20 MG/ML IJ SOLN
INTRAMUSCULAR | Status: DC | PRN
  Administered 2016-05-18: 100 mg via INTRAVENOUS

## 2016-05-18 MED ORDER — FENTANYL CITRATE (PF) 100 MCG/2ML IJ SOLN
INTRAMUSCULAR | Status: AC
  Administered 2016-05-18: 50 ug via INTRAVENOUS
  Filled 2016-05-18: qty 2

## 2016-05-18 MED ORDER — LIDOCAINE HCL (CARDIAC) 20 MG/ML IV SOLN
INTRAVENOUS | Status: AC
  Filled 2016-05-18: qty 5

## 2016-05-18 MED ORDER — MEPERIDINE HCL 25 MG/ML IJ SOLN
INTRAMUSCULAR | Status: AC
  Filled 2016-05-18: qty 1

## 2016-05-18 MED ORDER — HYDROMORPHONE HCL 1 MG/ML IJ SOLN: 0.5000 mg | INTRAMUSCULAR | Status: AC

## 2016-05-18 MED ORDER — FENTANYL CITRATE (PF) 100 MCG/2ML IJ SOLN
INTRAMUSCULAR | Status: DC | PRN
  Administered 2016-05-18: 100 ug via INTRAVENOUS
  Administered 2016-05-18: 50 ug via INTRAVENOUS

## 2016-05-18 MED ORDER — ONDANSETRON HCL 4 MG/2ML IJ SOLN
INTRAMUSCULAR | Status: DC | PRN
  Administered 2016-05-18: 4 mg via INTRAVENOUS

## 2016-05-18 MED ORDER — LACTATED RINGERS IV SOLN
INTRAVENOUS | Status: DC
  Administered 2016-05-18 (×2): via INTRAVENOUS

## 2016-05-18 MED ORDER — PROPOFOL 10 MG/ML IV BOLUS
INTRAVENOUS | Status: DC | PRN
  Administered 2016-05-18: 150 mg via INTRAVENOUS

## 2016-05-18 MED ORDER — ONDANSETRON HCL 4 MG/2ML IJ SOLN
INTRAMUSCULAR | Status: AC
Start: 2016-05-18 — End: 2016-05-18
  Filled 2016-05-18: qty 2

## 2016-05-18 MED ORDER — GLYCOPYRROLATE 0.2 MG/ML IJ SOLN
INTRAMUSCULAR | Status: AC
  Filled 2016-05-18: qty 1

## 2016-05-18 MED ORDER — METHYLERGONOVINE MALEATE 0.2 MG/ML IJ SOLN
INTRAMUSCULAR | Status: DC | PRN
  Administered 2016-05-18: 0.2 mg via INTRAMUSCULAR

## 2016-05-18 MED ORDER — DEXAMETHASONE SODIUM PHOSPHATE 4 MG/ML IJ SOLN
INTRAMUSCULAR | Status: AC
  Filled 2016-05-18: qty 1

## 2016-05-18 MED ORDER — SCOPOLAMINE 1 MG/3DAYS TD PT72
1.0000 | MEDICATED_PATCH | Freq: Once | TRANSDERMAL | Status: DC
  Administered 2016-05-18: 1.5 mg via TRANSDERMAL

## 2016-05-18 MED ORDER — PROPOFOL 10 MG/ML IV BOLUS
INTRAVENOUS | Status: AC
  Filled 2016-05-18: qty 20

## 2016-05-18 MED ORDER — FENTANYL CITRATE (PF) 100 MCG/2ML IJ SOLN
INTRAMUSCULAR | Status: AC
  Filled 2016-05-18: qty 2

## 2016-05-18 MED ORDER — ACETAMINOPHEN 10 MG/ML IV SOLN
1000.0000 mg | Freq: Once | INTRAVENOUS | Status: AC
  Administered 2016-05-18: 1000 mg via INTRAVENOUS
  Filled 2016-05-18: qty 100

## 2016-05-18 MED ORDER — DEXAMETHASONE SODIUM PHOSPHATE 10 MG/ML IJ SOLN
INTRAMUSCULAR | Status: DC | PRN
  Administered 2016-05-18: 4 mg via INTRAVENOUS

## 2016-05-18 MED ORDER — MIDAZOLAM HCL 2 MG/2ML IJ SOLN
INTRAMUSCULAR | Status: DC | PRN
  Administered 2016-05-18: 2 mg via INTRAVENOUS

## 2016-05-18 MED ORDER — HYDROMORPHONE HCL 1 MG/ML IJ SOLN
0.5000 mg | INTRAMUSCULAR | Status: DC
  Administered 2016-05-18 (×2): 0.5 mg via INTRAVENOUS

## 2016-05-18 MED ORDER — PROMETHAZINE HCL 25 MG/ML IJ SOLN
6.2500 mg | INTRAMUSCULAR | Status: DC | PRN
Start: 2016-05-18 — End: 2016-05-18

## 2016-05-18 MED ORDER — LIDOCAINE HCL (CARDIAC) 20 MG/ML IV SOLN
INTRAVENOUS | Status: DC | PRN
  Administered 2016-05-18: 100 mg via INTRAVENOUS

## 2016-05-18 MED ORDER — GLYCOPYRROLATE 0.2 MG/ML IJ SOLN
INTRAMUSCULAR | Status: DC | PRN
  Administered 2016-05-18: 0.2 mg via INTRAVENOUS

## 2016-05-18 MED ORDER — FENTANYL CITRATE (PF) 250 MCG/5ML IJ SOLN
INTRAMUSCULAR | Status: AC
  Filled 2016-05-18: qty 5

## 2016-05-18 MED ORDER — FENTANYL CITRATE (PF) 100 MCG/2ML IJ SOLN
25.0000 ug | INTRAMUSCULAR | Status: DC | PRN
  Administered 2016-05-18 (×3): 50 ug via INTRAVENOUS

## 2016-05-18 MED ORDER — HYDROMORPHONE HCL 1 MG/ML IJ SOLN
INTRAMUSCULAR | Status: AC
  Filled 2016-05-18: qty 1

## 2016-05-18 MED ORDER — SUCCINYLCHOLINE CHLORIDE 20 MG/ML IJ SOLN
INTRAMUSCULAR | Status: AC
  Filled 2016-05-18: qty 1

## 2016-05-18 MED ORDER — MIDAZOLAM HCL 2 MG/2ML IJ SOLN
INTRAMUSCULAR | Status: AC
  Filled 2016-05-18: qty 2

## 2016-05-18 MED ORDER — MEPERIDINE HCL 25 MG/ML IJ SOLN
6.2500 mg | INTRAMUSCULAR | Status: DC | PRN
  Administered 2016-05-18: 6.25 mg via INTRAVENOUS

## 2016-05-18 MED ORDER — SCOPOLAMINE 1 MG/3DAYS TD PT72
MEDICATED_PATCH | TRANSDERMAL | Status: AC
  Filled 2016-05-18: qty 1

## 2016-05-18 SURGICAL SUPPLY — 18 items
CATH ROBINSON RED A/P 16FR (CATHETERS) ×2 IMPLANT
CLOTH BEACON ORANGE TIMEOUT ST (SAFETY) ×2 IMPLANT
DECANTER SPIKE VIAL GLASS SM (MISCELLANEOUS) ×2 IMPLANT
GLOVE BIO SURGEON STRL SZ7 (GLOVE) ×2 IMPLANT
GLOVE BIOGEL PI IND STRL 7.0 (GLOVE) ×2 IMPLANT
GLOVE BIOGEL PI INDICATOR 7.0 (GLOVE) ×2
GOWN STRL REUS W/TWL LRG LVL3 (GOWN DISPOSABLE) ×4 IMPLANT
KIT BERKELEY 1ST TRIMESTER 3/8 (MISCELLANEOUS) ×2 IMPLANT
NS IRRIG 1000ML POUR BTL (IV SOLUTION) ×2 IMPLANT
PACK VAGINAL MINOR WOMEN LF (CUSTOM PROCEDURE TRAY) ×2 IMPLANT
PAD OB MATERNITY 4.3X12.25 (PERSONAL CARE ITEMS) ×2 IMPLANT
PAD PREP 24X48 CUFFED NSTRL (MISCELLANEOUS) ×2 IMPLANT
SET BERKELEY SUCTION TUBING (SUCTIONS) ×2 IMPLANT
TOWEL OR 17X24 6PK STRL BLUE (TOWEL DISPOSABLE) ×4 IMPLANT
VACURETTE 10 RIGID CVD (CANNULA) IMPLANT
VACURETTE 7MM CVD STRL WRAP (CANNULA) IMPLANT
VACURETTE 8 RIGID CVD (CANNULA) IMPLANT
VACURETTE 9 RIGID CVD (CANNULA) IMPLANT

## 2016-05-18 NOTE — H&P (Signed)
30 y.o. yo complains of continued bleeding and pain since D&E at [place] about two weeks ago.  [place] yesterday showed possible retained products.  Past Medical History  Diagnosis Date  . Deep vein thrombosis (DVT) ([place])   . Hip discomfort   . Anemia   . Headache   . Asthma   . Environmental allergies   . Ovarian cyst   . Breast cyst    Past Surgical History  Procedure Laterality Date  . Appendectomy    . Cesarean section    . Gastric bypass    . Induced abortion    . Etopi    . Ectopic pregnancy surgery    . Ovarian cyst surgery    . Dilation and curettage of uterus      1 WEEK AGO    Social History   Social History  . Marital Status: Single    Spouse Name: N/A  . Number of Children: N/A  . Years of Education: N/A   Occupational History  . Not on file.   Social History Main Topics  . Smoking status: Never Smoker   . Smokeless tobacco: Never Used  . Alcohol Use: 0.6 oz/week    1 Glasses of wine per week     Comment: OCCASSIONAL  . Drug Use: No  . Sexual Activity: Yes    Birth Control/ Protection: None   Other Topics Concern  . Not on file   Social History Narrative    No current facility-administered medications on file prior to encounter.   Current Outpatient Prescriptions on File Prior to Encounter  Medication Sig Dispense Refill  . albuterol (PROVENTIL HFA;VENTOLIN HFA) 108 (90 BASE) MCG/ACT inhaler Inhale 2 puffs into the lungs every 6 (six) hours as needed for wheezing or shortness of breath.    . cetirizine (ZYRTEC) 10 MG tablet Take 10-30 mg by mouth at bedtime.     . dicyclomine (BENTYL) 10 MG capsule Take 10 mg by mouth 3 (three) times daily as needed for spasms.    Marland Kitchen enoxaparin (LOVENOX) 40 MG/0.4ML injection Inject 40 mg into the skin daily.    Marland Kitchen EPINEPHrine (EPIPEN IJ) Inject 1 each as directed once.    . ferrous sulfate 325 (65 FE) MG tablet Take 325 mg by mouth daily as needed (low iron).     . fluticasone (FLONASE) 50 MCG/ACT nasal spray Place  1 spray into both nostrils daily as needed for allergies or rhinitis.    Marland Kitchen gabapentin (NEURONTIN) 100 MG capsule Take 100 mg by mouth 3 (three) times daily as needed (pain). Depends on pain level if takes 100 mg or 300 mg    . Hyprom-Naphaz-Polysorb-Zn Sulf (CLEAR EYES COMPLETE OP) Apply 1 drop to eye 2 (two) times daily as needed (for dryness and irritation).    Marland Kitchen ibuprofen (ADVIL,MOTRIN) 800 MG tablet Take 800 mg by mouth every 8 (eight) hours as needed.    . montelukast (SINGULAIR) 10 MG tablet Take 1 tablet (10 mg total) by mouth daily. 30 tablet 3  . nitrofurantoin (MACRODANTIN) 100 MG capsule Take 100 mg by mouth 2 (two) times daily.    . ranitidine (ZANTAC) 300 MG tablet Take 300 mg by mouth at bedtime.    . traMADol (ULTRAM) 50 MG tablet Take 50 mg by mouth every 6 (six) hours as needed for moderate pain.    Marland Kitchen triamcinolone cream (KENALOG) 0.5 % Apply 1 application topically 3 (three) times daily as needed (For rash.).      Allergies  Allergen Reactions  . Penicillins Hives    Has patient had a PCN reaction causing immediate rash, facial/tongue/throat swelling, SOB or lightheadedness with hypotension: Yes Has patient had a PCN reaction causing severe rash involving mucus membranes or skin necrosis: No Has patient had a PCN reaction that required hospitalization No Has patient had a PCN reaction occurring within the last 10 years: Yes If all of the above answers are "NO", then may proceed with Cephalosporin use.    . Shellfish Allergy Anaphylaxis    Patient is allergic to all seafood.  Marland Kitchen Histamine Other (See Comments)    Patient is tested positive in allergy testing to Control-histamine 1.  . Other Swelling    Mayonnaise causes swelling of the tongue.  Marland Kitchen Peanut Butter Flavor Hives  . Voltaren [Diclofenac] Other (See Comments) and Rash    Volatren gel causes blisters.    @VITALS2 @  Lungs: clear to ascultation Cor:  RRR Abdomen:  soft, nontender, nondistended. Ex:  no cords,  erythema Pelvic:  NEFG, 10 week size uterus, cervix closed per NP yesterday  A:  For D&E for retained products.   P: All risks, benefits and alternatives d/w patient and she desires to proceed.  Patient will have SCDs during the operation.   Pt has been on lovenox for hx of DVT.  Pt will need to check with PCP to see when to switch back to oral meds.  Blase Beckner A

## 2016-05-18 NOTE — Discharge Instructions (Addendum)
Restart lovenox tonight; will need at least 2 weeks post op.  If pt is on chronic anticoagulation, will need PCP to help with transition back to oral meds.DISCHARGE INSTRUCTIONS: D&C / D&E The following instructions have been prepared to help you care for yourself upon your return home.   Personal hygiene:  Use sanitary pads for vaginal drainage, not tampons.  Shower the day after your procedure.  NO tub baths, pools or Jacuzzis for 2-3 weeks.  Wipe front to back after using the bathroom.  Activity and limitations:  Do NOT drive or operate any equipment for 24 hours. The effects of anesthesia are still present and drowsiness may result.  Do NOT rest in bed all day.  Walking is encouraged.  Walk up and down stairs slowly.  You may resume your normal activity in one to two days or as indicated by your physician.  Sexual activity: NO intercourse for at least 2 weeks after the procedure, or as indicated by your physician.  Diet: Eat a light meal as desired this evening. You may resume your usual diet tomorrow.  Return to work: You may resume your work activities in one to two days or as indicated by your doctor.  What to expect after your surgery: Expect to have vaginal bleeding/discharge for 2-3 days and spotting for up to 10 days. It is not unusual to have soreness for up to 1-2 weeks. You may have a slight burning sensation when you urinate for the first day. Mild cramps may continue for a couple of days. You may have a regular period in 2-6 weeks.  Call your doctor for any of the following:  Excessive vaginal bleeding, saturating and changing one pad every hour.  Inability to urinate 6 hours after discharge from hospital.  Pain not relieved by pain medication.  Fever of 100.4 F or greater.  Unusual vaginal discharge or odor.   Call for an appointment:    Patients signature: ______________________  Nurses signature ________________________  Support person's  signature_______________________    Post Anesthesia Home Care Instructions  Activity: Get plenty of rest for the remainder of the day. A responsible adult should stay with you for 24 hours following the procedure.  For the next 24 hours, DO NOT: -Drive a car -Paediatric nurse -Drink alcoholic beverages -Take any medication unless instructed by your physician -Make any legal decisions or sign important papers.  Meals: Start with liquid foods such as gelatin or soup. Progress to regular foods as tolerated. Avoid greasy, spicy, heavy foods. If nausea and/or vomiting occur, drink only clear liquids until the nausea and/or vomiting subsides. Call your physician if vomiting continues.  Special Instructions/Symptoms: Your throat may feel dry or sore from the anesthesia or the breathing tube placed in your throat during surgery. If this causes discomfort, gargle with warm salt water. The discomfort should disappear within 24 hours.  If you had a scopolamine patch placed behind your ear for the management of post- operative nausea and/or vomiting:  1. The medication in the patch is effective for 72 hours, after which it should be removed.  Wrap patch in a tissue and discard in the trash. Wash hands thoroughly with soap and water. 2. You may remove the patch earlier than 72 hours if you experience unpleasant side effects which may include dry mouth, dizziness or visual disturbances. 3. Avoid touching the patch. Wash your hands with soap and water after contact with the patch.

## 2016-05-18 NOTE — Anesthesia Preprocedure Evaluation (Addendum)
Anesthesia Evaluation  Patient identified by MRN, date of birth, ID band Patient awake    Reviewed: Allergy & Precautions, NPO status , Patient's Chart, lab work & pertinent test results  Airway Mallampati: II   Neck ROM: Full    Dental  (+) Teeth Intact, Dental Advisory Given   Pulmonary neg pulmonary ROS,    breath sounds clear to auscultation       Cardiovascular negative cardio ROS   Rhythm:Regular     Neuro/Psych  Headaches, negative neurological ROS  negative psych ROS   GI/Hepatic negative GI ROS, Neg liver ROS,   Endo/Other  negative endocrine ROSBMI 35  Renal/GU negative Renal ROS  negative genitourinary   Musculoskeletal negative musculoskeletal ROS (+)   Abdominal   Peds negative pediatric ROS (+)  Hematology negative hematology ROS (+) 11/33   Anesthesia Other Findings   Reproductive/Obstetrics negative OB ROS                          Anesthesia Physical Anesthesia Plan  ASA: II  Anesthesia Plan: General   Post-op Pain Management:    Induction:   Airway Management Planned: Oral ETT  Additional Equipment:   Intra-op Plan:   Post-operative Plan: Extubation in OR  Informed Consent: I have reviewed the patients History and Physical, chart, labs and discussed the procedure including the risks, benefits and alternatives for the proposed anesthesia with the patient or authorized representative who has indicated his/her understanding and acceptance.     Plan Discussed with:   Anesthesia Plan Comments:         Anesthesia Quick Evaluation

## 2016-05-18 NOTE — Transfer of Care (Signed)
Immediate Anesthesia Transfer of Care Note  Patient: Nina Wood  Procedure(s) Performed: Procedure(s): DILATATION AND EVACUATION (Bilateral)  Patient Location: PACU  Anesthesia Type:General  Level of Consciousness: awake, alert  and oriented  Airway & Oxygen Therapy: Patient Spontanous Breathing and Patient connected to nasal cannula oxygen  Post-op Assessment: Report given to RN, Post -op Vital signs reviewed and stable and Patient moving all extremities  Post vital signs: Reviewed and stable  Last Vitals:  Filed Vitals:   05/18/16 1058  BP: 144/100  Pulse: 68  Temp: 36.9 C  Resp: 16    Last Pain:  Filed Vitals:   05/18/16 1147  PainSc: 8       Patients Stated Pain Goal: 3 ([id number])  Complications: No apparent anesthesia complications

## 2016-05-18 NOTE — Anesthesia Procedure Notes (Signed)
Procedure Name: Intubation Date/Time: 05/18/2016 12:16 PM Performed by: Hewitt Blade Pre-anesthesia Checklist: Patient identified, Patient being monitored, Emergency Drugs available and Suction available Patient Re-evaluated:Patient Re-evaluated prior to inductionOxygen Delivery Method: Circle system utilized Preoxygenation: Pre-oxygenation with 100% oxygen Intubation Type: IV induction Laryngoscope Size: Mac and 3 Grade View: Grade I Tube type: Oral Tube size: 7.0 mm Number of attempts: 1 Airway Equipment and Method: Stylet Placement Confirmation: ETT inserted through vocal cords under direct vision,  positive ETCO2 and breath sounds checked- equal and bilateral Secured at: 21 cm Tube secured with: Tape Dental Injury: Teeth and Oropharynx as per pre-operative assessment

## 2016-05-18 NOTE — Op Note (Signed)
05/18/2016  12:30 PM  PATIENT:  Nina Wood  30 y.o. female  PRE-OPERATIVE DIAGNOSIS:  Incomplete abortion  POST-OPERATIVE DIAGNOSIS:  INCOMPLETE ABORTION; retained POC  PROCEDURE:  Procedure(s): DILATATION AND EVACUATION (Bilateral)  SURGEON:  Surgeon(s) and Role:    * Bobbye Charleston, MD - Primary   ANESTHESIA:   general  EBL:  Total I/O In: -  Out: 300 [Urine:100; Blood:200]  SPECIMEN:  Source of Specimen:  poc  DISPOSITION OF SPECIMEN:  PATHOLOGY  COUNTS:  YES  TOURNIQUET:  * No tourniquets in log *  DICTATION: .Note written in EPIC  PLAN OF CARE: Discharge to home after PACU  PATIENT DISPOSITION:  PACU - hemodynamically stable.   Delay start of Pharmacological VTE agent (>24hrs) due to surgical blood loss or risk of bleeding: no  Medications: Methergine  Complications: None  Findings:  10 week size uterus to 7 size post procedure.  Good crie was achieved.  After adequate anesthesia was achieved, the patient was prepped and draped in the usual sterile fashion.  The speculum was placed in the vagina and the cervix stabilized with a single-tooth tenaculum.  The cervix was dilated with Kennon Rounds dilators and the 9 mm curette was used to remove contents of the uterus.  Alternating sharp curettage with a curette and suction curettage was performed until all contents were removed and good crie was achieved.  All instruments were removed from the vagina.  The patient tolerated the procedure well.    Nina Wood A

## 2016-05-18 NOTE — Brief Op Note (Signed)
05/18/2016  12:30 PM  PATIENT:  Nina Wood  30 y.o. female  PRE-OPERATIVE DIAGNOSIS:  Incomplete abortion  POST-OPERATIVE DIAGNOSIS:  INCOMPLETE ABORTION; retained POC  PROCEDURE:  Procedure(s): DILATATION AND EVACUATION (Bilateral)  SURGEON:  Surgeon(s) and Role:    * Bobbye Charleston, MD - Primary   ANESTHESIA:   general  EBL:  Total I/O In: -  Out: 300 [Urine:100; Blood:200]  SPECIMEN:  Source of Specimen:  poc  DISPOSITION OF SPECIMEN:  PATHOLOGY  COUNTS:  YES  TOURNIQUET:  * No tourniquets in log *  DICTATION: .Note written in EPIC  PLAN OF CARE: Discharge to home after PACU  PATIENT DISPOSITION:  PACU - hemodynamically stable.   Delay start of Pharmacological VTE agent (>24hrs) due to surgical blood loss or risk of bleeding: no  Medications: Methergine  Complications: None  Findings:  10 week size uterus to 7 size post procedure.  Good crie was achieved.  After adequate anesthesia was achieved, the patient was prepped and draped in the usual sterile fashion.  The speculum was placed in the vagina and the cervix stabilized with a single-tooth tenaculum.  The cervix was dilated with Kennon Rounds dilators and the 9 mm curette was used to remove contents of the uterus.  Alternating sharp curettage with a curette and suction curettage was performed until all contents were removed and good crie was achieved.  All instruments were removed from the vagina.  The patient tolerated the procedure well.    Revere Maahs A

## 2016-05-18 NOTE — Anesthesia Postprocedure Evaluation (Signed)
Anesthesia Post Note  Patient: Nina Wood  Procedure(s) Performed: Procedure(s) (LRB): DILATATION AND EVACUATION (Bilateral)  Patient location during evaluation: PACU Anesthesia Type: General Level of consciousness: awake and alert Pain management: pain level controlled Vital Signs Assessment: post-procedure vital signs reviewed and stable Respiratory status: spontaneous breathing, nonlabored ventilation, respiratory function stable and patient connected to nasal cannula oxygen Cardiovascular status: blood pressure returned to baseline and stable Postop Assessment: no signs of nausea or vomiting Anesthetic complications: no     Last Vitals:  Filed Vitals:   05/18/16 1245 05/18/16 1300  BP: 133/84 122/82  Pulse: 72 64  Temp:    Resp: 20 17    Last Pain:  Filed Vitals:   05/18/16 1306  PainSc: 7    Pain Goal: Patients Stated Pain Goal: 4 (05/18/16 1300)               Alexis Frock

## 2016-05-19 ENCOUNTER — Emergency Department (HOSPITAL_COMMUNITY): Payer: PRIVATE HEALTH INSURANCE

## 2016-05-19 ENCOUNTER — Encounter (HOSPITAL_COMMUNITY): Payer: Self-pay | Admitting: *Deleted

## 2016-05-19 ENCOUNTER — Emergency Department (HOSPITAL_COMMUNITY)
Admission: EM | Admit: 2016-05-19 | Discharge: 2016-05-20 | Disposition: A | Discharge status: Home / Self Care | Payer: PRIVATE HEALTH INSURANCE | Attending: Emergency Medicine | Admitting: Emergency Medicine

## 2016-05-19 DIAGNOSIS — J45909 Unspecified asthma, uncomplicated: Secondary | ICD-10-CM | POA: Insufficient documentation

## 2016-05-19 DIAGNOSIS — M546 Pain in thoracic spine: Secondary | ICD-10-CM | POA: Diagnosis not present

## 2016-05-19 DIAGNOSIS — G8918 Other acute postprocedural pain: Secondary | ICD-10-CM | POA: Diagnosis not present

## 2016-05-19 DIAGNOSIS — R0602 Shortness of breath: Secondary | ICD-10-CM | POA: Diagnosis not present

## 2016-05-19 DIAGNOSIS — R102 Pelvic and perineal pain: Secondary | ICD-10-CM | POA: Insufficient documentation

## 2016-05-19 DIAGNOSIS — J069 Acute upper respiratory infection, unspecified: Secondary | ICD-10-CM | POA: Insufficient documentation

## 2016-05-19 DIAGNOSIS — M549 Dorsalgia, unspecified: Secondary | ICD-10-CM | POA: Diagnosis present

## 2016-05-19 LAB — CBC
HCT: 31.5 % — ABNORMAL LOW (ref 36.0–46.0)
Hemoglobin: 10.1 g/dL — ABNORMAL LOW (ref 12.0–15.0)
MCH: 25.5 pg — ABNORMAL LOW (ref 26.0–34.0)
MCHC: 32.1 g/dL (ref 30.0–36.0)
MCV: 79.5 fL (ref 78.0–100.0)
Platelets: 456 10*3/uL — ABNORMAL HIGH (ref 150–400)
RBC: 3.96 MIL/uL (ref 3.87–5.11)
RDW: 14.5 % (ref 11.5–15.5)
WBC: 14.5 10*3/uL — ABNORMAL HIGH (ref 4.0–10.5)

## 2016-05-19 LAB — I-STAT TROPONIN, ED: Troponin i, poc: 0 ng/mL (ref 0.00–0.08)

## 2016-05-19 LAB — COMPREHENSIVE METABOLIC PANEL
ALT: 10 U/L — ABNORMAL LOW (ref 14–54)
AST: 14 U/L — ABNORMAL LOW (ref 15–41)
Albumin: 3.4 g/dL — ABNORMAL LOW (ref 3.5–5.0)
Alkaline Phosphatase: 44 U/L (ref 38–126)
Anion gap: 7 (ref 5–15)
BUN: 13 mg/dL (ref 6–20)
CO2: 25 mmol/L (ref 22–32)
Calcium: 9 mg/dL (ref 8.9–10.3)
Chloride: 106 mmol/L (ref 101–111)
Creatinine, Ser: 0.68 mg/dL (ref 0.44–1.00)
GFR calc Af Amer: >60 mL/min (ref >60)
GFR calc non Af Amer: >60 mL/min (ref >60)
Glucose, Bld: 109 mg/dL — ABNORMAL HIGH (ref 65–99)
Potassium: 3.4 mmol/L — ABNORMAL LOW (ref 3.5–5.1)
Sodium: 138 mmol/L (ref 135–145)
Total Bilirubin: 0.3 mg/dL (ref 0.3–1.2)
Total Protein: 6.5 g/dL (ref 6.5–8.1)

## 2016-05-19 LAB — I-STAT BETA HCG BLOOD, ED (MC, WL, AP ONLY): I-stat hCG, quantitative: 1032.6 m[IU]/mL — ABNORMAL HIGH (ref <5)

## 2016-05-19 LAB — BRAIN NATRIURETIC PEPTIDE: B Natriuretic Peptide: 22.3 pg/mL (ref 0.0–100.0)

## 2016-05-19 MED ORDER — OXYCODONE-ACETAMINOPHEN 5-325 MG PO TABS
1.0000 | ORAL_TABLET | Freq: Once | ORAL | Status: AC
  Administered 2016-05-19: 1 via ORAL
  Filled 2016-05-19: qty 1

## 2016-05-19 MED ORDER — IOPAMIDOL (ISOVUE-370) INJECTION 76%
INTRAVENOUS | Status: AC
  Administered 2016-05-19: 100 mL
  Filled 2016-05-19: qty 100

## 2016-05-19 MED ORDER — OXYCODONE-ACETAMINOPHEN 5-325 MG PO TABS: 1.0000 | ORAL_TABLET | Freq: Four times a day (QID) | ORAL | Status: DC | PRN

## 2016-05-19 NOTE — ED Notes (Addendum)
Pt reports having D&C done yesterday, pt had onset yesterday of cough, coughing up blood and back pain. Sent here by ob/gyn to r/o PE due to hx of blood clots. Had chest xray done yesterday.

## 2016-05-19 NOTE — ED Provider Notes (Signed)
CSN: [id number]     Arrival date & time 05/19/16  57 History   First MD Initiated Contact with Patient 05/19/16 2025     Chief Complaint  Patient presents with  . Post-op Problem  . Back Pain  . Cough     (Consider location/radiation/quality/duration/timing/severity/associated sxs/prior Treatment) HPI Comments: 30yo F w/ h/o DVT who p/w back pain, hemoptysis, SOB. The patient had a D&C after spontaneous abortion on 6/28. She had some retained products and had repeat D&C yesterday. She has been off lovenox for her DVT treatment since 6/28. In recovery room, she began having severe left upper back pain which was worse with deep inspiration. She later began having some chest pain and shortness of breath with the back pain. The back pain has now spread to her entire upper back, is constant, and severe. MAXIMUM TEMPERATURE 99.8 at home. She was initially coughing up mucus but later started coughing up mucus mixed with bright red blood. She notes a sore throat that she had earlier in the day but has gotten worse since then. No sick contacts. No leg swelling. She discussed w/ her OBGYN today, who sent her in due to concern for PE.   Patient is a 30 y.o. female presenting with back pain and cough. The history is provided by the patient.  Back Pain Cough   Past Medical History  Diagnosis Date  . Deep vein thrombosis (DVT) ([place])   . Hip discomfort   . Anemia   . Headache   . Asthma   . Environmental allergies   . Ovarian cyst   . Breast cyst    Past Surgical History  Procedure Laterality Date  . Appendectomy    . Cesarean section    . Gastric bypass    . Induced abortion    . Etopi    . Ectopic pregnancy surgery    . Ovarian cyst surgery    . Dilation and curettage of uterus      1 WEEK AGO   Family History  Problem Relation Age of Onset  . Cancer Mother   . Hypertension Mother   . Cancer Father   . Hypertension Maternal Uncle   . Diabetes Paternal Aunt   . Cancer Maternal  Grandmother    Social History  Substance Use Topics  . Smoking status: Never Smoker   . Smokeless tobacco: Never Used  . Alcohol Use: 0.6 oz/week    1 Glasses of wine per week     Comment: OCCASSIONAL   OB History    Gravida Para Term Preterm AB TAB SAB Ectopic Multiple Living   6 2 2  0 4 1 2 1  0 2     Review of Systems  Respiratory: Positive for cough.   Musculoskeletal: Positive for back pain.   10 Systems reviewed and are negative for acute change except as noted in the HPI.    Allergies  Penicillins; Shellfish allergy; Other; Peanut butter flavor; Histamine; and Voltaren  Home Medications   Prior to Admission medications   Medication Sig Start Date End Date Taking? Authorizing Provider  albuterol (PROVENTIL HFA;VENTOLIN HFA) 108 (90 BASE) MCG/ACT inhaler Inhale 2 puffs into the lungs every 6 (six) hours as needed for wheezing or shortness of breath.   Yes Historical Provider, MD  cetirizine (ZYRTEC) 10 MG tablet Take 10-30 mg by mouth at bedtime.    Yes Historical Provider, MD  dicyclomine (BENTYL) 10 MG capsule Take 10 mg by mouth 3 (three) times daily as  needed for spasms.   Yes Historical Provider, MD  enoxaparin (LOVENOX) 40 MG/0.4ML injection Inject 40 mg into the skin daily.   Yes Historical Provider, MD  EPINEPHrine (EPIPEN IJ) Inject 1 each as directed once.   Yes Historical Provider, MD  ferrous sulfate 325 (65 FE) MG tablet Take 325 mg by mouth daily as needed (low iron).    Yes Historical Provider, MD  fluticasone (FLONASE) 50 MCG/ACT nasal spray Place 1 spray into both nostrils daily as needed for allergies or rhinitis.   Yes Historical Provider, MD  gabapentin (NEURONTIN) 100 MG capsule Take 100 mg by mouth 3 (three) times daily as needed (pain). Depends on pain level if takes 100 mg or 300 mg   Yes Historical Provider, MD  HYDROcodone-acetaminophen (NORCO/VICODIN) 5-325 MG tablet Take 1 tablet by mouth every 6 (six) hours as needed for moderate pain.   Yes  Historical Provider, MD  Hyprom-Naphaz-Polysorb-Zn Sulf (CLEAR EYES COMPLETE OP) Apply 1 drop to eye 2 (two) times daily as needed (for dryness and irritation).   Yes Historical Provider, MD  ibuprofen (ADVIL,MOTRIN) 800 MG tablet Take 800 mg by mouth every 8 (eight) hours as needed.   Yes Historical Provider, MD  montelukast (SINGULAIR) 10 MG tablet Take 1 tablet (10 mg total) by mouth daily. 05/12/16  Yes Jiles Prows, MD  ranitidine (ZANTAC) 300 MG tablet Take 300 mg by mouth at bedtime.   Yes Historical Provider, MD  traMADol (ULTRAM) 50 MG tablet Take 50 mg by mouth every 6 (six) hours as needed for moderate pain.   Yes Historical Provider, MD  triamcinolone cream (KENALOG) 0.5 % Apply 1 application topically 3 (three) times daily as needed (For rash.).   Yes Historical Provider, MD  oxyCODONE-acetaminophen (PERCOCET/ROXICET) 5-325 MG tablet Take 1-2 tablets by mouth every 6 (six) hours as needed for severe pain. 05/19/16   Wenda Overland Dhyan Noah, MD   BP 130/84 mmHg  Pulse 82  Temp(Src) 98.4 F (36.9 C) (Oral)  Resp 22  SpO2 100%  LMP 02/20/2016 Physical Exam  Constitutional: She is oriented to person, place, and time. She appears well-developed and well-nourished. No distress.  uncomfortable  HENT:  Head: Normocephalic and atraumatic.  Moist mucous membranes  Eyes: Conjunctivae are normal. Pupils are equal, round, and reactive to light.  Neck: Neck supple.  Cardiovascular: Normal rate, regular rhythm and normal heart sounds.   No murmur heard. Pulmonary/Chest: Effort normal and breath sounds normal.  Abdominal: Soft. Bowel sounds are normal. She exhibits no distension. There is tenderness (suprapubic).  Musculoskeletal: She exhibits no edema.  Neurological: She is alert and oriented to person, place, and time.  Fluent speech  Skin: Skin is warm and dry.  Psychiatric: She has a normal mood and affect. Judgment normal.  Nursing note and vitals reviewed.   ED Course  Procedures  (including critical care time) Labs Review Labs Reviewed  COMPREHENSIVE METABOLIC PANEL - Abnormal; Notable for the following:    Potassium 3.4 (*)    Glucose, Bld 109 (*)    Albumin 3.4 (*)    AST 14 (*)    ALT 10 (*)    All other components within normal limits  CBC - Abnormal; Notable for the following:    WBC 14.5 (*)    Hemoglobin 10.1 (*)    HCT 31.5 (*)    MCH 25.5 (*)    Platelets 456 (*)    All other components within normal limits  I-STAT BETA HCG BLOOD, ED ([facility],  [facility], [facility] ONLY) - Abnormal; Notable for the following:    I-stat hCG, quantitative 1032.6 (*)    All other components within normal limits  BRAIN NATRIURETIC PEPTIDE  I-STAT TROPOININ, ED    Imaging Review Ct Angio Chest Pe W/cm &/or Wo Cm  05/19/2016  CLINICAL DATA:  Hemoptysis, back and chest pain. Shortness of breath. History of DVTs. D and C for miscarriage yesterday. EXAM: CT ANGIOGRAPHY CHEST WITH CONTRAST TECHNIQUE: Multidetector CT imaging of the chest was performed using the standard protocol during bolus administration of intravenous contrast. Multiplanar CT image reconstructions and MIPs were obtained to evaluate the vascular anatomy. CONTRAST:  100 cc Isovue 370 IV COMPARISON:  Chest radiograph yesterday.  Chest CT 08/22/2014 FINDINGS: Cardiovascular: There are no filling defects within the pulmonary arteries to suggest pulmonary embolus. Distal subsegmental branches are suboptimally assessed due to contrast bolus timing. Thoracic aorta is normal in caliber. Mediastinum/Nodes: No adenopathy or mass.  No pericardial fluid. Lungs/Pleura: No consolidation, evidence pulmonary edema, mass or suspicious nodule. No pleural fluid. Trachea and mainstem bronchi are patent. Upper Abdomen: No acute abnormality.  Post gastric bypass. Musculoskeletal: There are no acute or suspicious osseous abnormalities. Review of the MIP images confirms the above findings. IMPRESSION: No pulmonary embolus or acute intrathoracic process.  Electronically Signed   By: Jeb Levering M.D.   On: 05/19/2016 22:35   Dg Chest Port 1 View  05/18/2016  CLINICAL DATA:  Upper back pain post D and C today. Also chest and upper back pain with inspiration. No cardiac/pulm hx reported. EXAM: PORTABLE CHEST 1 VIEW COMPARISON:  10/09/2014 FINDINGS: Midline trachea. Normal heart size for level of inspiration. No pleural effusion or pneumothorax. left lung base poorly evaluated secondary to overlying breast tissue and AP portable technique. Otherwise, lungs are clear. No congestive failure. IMPRESSION: No acute cardiopulmonary disease. Electronically Signed   By: Abigail Miyamoto M.D.   On: 05/18/2016 15:57   I have personally reviewed and evaluated these lab results as part of my medical decision-making.   EKG Interpretation   Date/Time:  Friday May 19 2016 20:34:25 EDT Ventricular Rate:  62 PR Interval:    QRS Duration: 99 QT Interval:  401 QTC Calculation: 408 R Axis:   17 Text Interpretation:  Sinus rhythm Nonspecific T abnormalities, anterior  leads new T wave inversions in V2-V3 Confirmed by Trissa Molina MD, [place]ge  [phone]) on 05/19/2016 8:39:03 PM     Medications  oxyCODONE-acetaminophen (PERCOCET/ROXICET) 5-325 MG per tablet 1 tablet (1 tablet Oral Given 05/19/16 2135)  iopamidol (ISOVUE-370) 76 % injection (100 mLs  Contrast Given 05/19/16 2208)    MDM   Final diagnoses:  Bilateral thoracic back pain  Upper respiratory infection   Pt w/ D&C yesterday sent in for back pain assoc w/ CP, SOB, and coughing up blood since procedure. H/o DVT, off lovenox currently. On exam, she was awake and alert, in no acute distress. Vital signs unremarkable. She had suprapubic tenderness which she has had since D&C. EKG with nonspecific T-wave inversions in V2-V3, no reciprocal changes. Chest x-ray yesterday was unremarkable. Labwork shows normal troponin, normal BNP, hCG 1032 which is decreased from previous of 97,000, expected change after procedure.  Obtained CTA of chest to r/o PE given h/o DVT and current sx. CTA was negative. Given that the patient has had productive cough and sore throat since yesterday, I suspect viral upper respiratory infection. I have discussed supportive care as well as return precautions and instructed to follow-up with PCP regarding  these symptoms. Pt voiced understanding and was discharged in satisfactory condition.   Sharlett Iles, MD 05/19/16 [phone]

## 2016-05-19 NOTE — ED Notes (Signed)
Patient transported to CT 

## 2016-05-25 ENCOUNTER — Encounter (HOSPITAL_COMMUNITY): Payer: Self-pay | Admitting: Obstetrics and Gynecology

## 2016-07-11 ENCOUNTER — Other Ambulatory Visit: Payer: Self-pay | Admitting: Obstetrics and Gynecology

## 2016-07-25 ENCOUNTER — Inpatient Hospital Stay (HOSPITAL_COMMUNITY): Payer: PRIVATE HEALTH INSURANCE

## 2016-07-25 ENCOUNTER — Encounter (HOSPITAL_COMMUNITY): Payer: Self-pay

## 2016-07-25 ENCOUNTER — Inpatient Hospital Stay (HOSPITAL_COMMUNITY)
Admission: AD | Admit: 2016-07-25 | Discharge: 2016-07-25 | Disposition: A | Discharge status: Home / Self Care | Payer: PRIVATE HEALTH INSURANCE | Source: Ambulatory Visit | Attending: Obstetrics and Gynecology | Admitting: Obstetrics and Gynecology

## 2016-07-25 DIAGNOSIS — G8929 Other chronic pain: Secondary | ICD-10-CM | POA: Insufficient documentation

## 2016-07-25 DIAGNOSIS — R1031 Right lower quadrant pain: Secondary | ICD-10-CM

## 2016-07-25 DIAGNOSIS — D649 Anemia, unspecified: Secondary | ICD-10-CM | POA: Insufficient documentation

## 2016-07-25 DIAGNOSIS — R109 Unspecified abdominal pain: Secondary | ICD-10-CM

## 2016-07-25 DIAGNOSIS — Z880 Allergy status to penicillin: Secondary | ICD-10-CM | POA: Diagnosis not present

## 2016-07-25 DIAGNOSIS — Z86718 Personal history of other venous thrombosis and embolism: Secondary | ICD-10-CM | POA: Diagnosis not present

## 2016-07-25 LAB — COMPREHENSIVE METABOLIC PANEL
ALT: 10 U/L — ABNORMAL LOW (ref 14–54)
AST: 14 U/L — ABNORMAL LOW (ref 15–41)
Albumin: 4 g/dL (ref 3.5–5.0)
Alkaline Phosphatase: 45 U/L (ref 38–126)
Anion gap: 4 — ABNORMAL LOW (ref 5–15)
BUN: 20 mg/dL (ref 6–20)
CO2: 26 mmol/L (ref 22–32)
Calcium: 8.6 mg/dL — ABNORMAL LOW (ref 8.9–10.3)
Chloride: 105 mmol/L (ref 101–111)
Creatinine, Ser: 0.59 mg/dL (ref 0.44–1.00)
GFR calc Af Amer: >60 mL/min (ref >60)
GFR calc non Af Amer: >60 mL/min (ref >60)
Glucose, Bld: 99 mg/dL (ref 65–99)
Potassium: 4 mmol/L (ref 3.5–5.1)
Sodium: 135 mmol/L (ref 135–145)
Total Bilirubin: 0.3 mg/dL (ref 0.3–1.2)
Total Protein: 7.6 g/dL (ref 6.5–8.1)

## 2016-07-25 LAB — URINALYSIS, ROUTINE W REFLEX MICROSCOPIC
Bilirubin Urine: NEGATIVE
Glucose, UA: NEGATIVE mg/dL
Ketones, ur: NEGATIVE mg/dL
Leukocytes, UA: NEGATIVE
Nitrite: NEGATIVE
Protein, ur: NEGATIVE mg/dL
Specific Gravity, Urine: >1.03 — ABNORMAL HIGH (ref 1.005–1.030)
pH: 5.5 (ref 5.0–8.0)

## 2016-07-25 LAB — URINE MICROSCOPIC-ADD ON
RBC / HPF: NONE SEEN RBC/hpf (ref 0–5)
WBC, UA: NONE SEEN WBC/hpf (ref 0–5)

## 2016-07-25 LAB — CBC
HCT: 31.5 % — ABNORMAL LOW (ref 36.0–46.0)
Hemoglobin: 10 g/dL — ABNORMAL LOW (ref 12.0–15.0)
MCH: 23.8 pg — ABNORMAL LOW (ref 26.0–34.0)
MCHC: 31.7 g/dL (ref 30.0–36.0)
MCV: 75 fL — ABNORMAL LOW (ref 78.0–100.0)
Platelets: 366 10*3/uL (ref 150–400)
RBC: 4.2 MIL/uL (ref 3.87–5.11)
RDW: 14.4 % (ref 11.5–15.5)
WBC: 8.6 10*3/uL (ref 4.0–10.5)

## 2016-07-25 NOTE — MAU Note (Signed)
Patient had a D/C. Patient presents with c/o right abdominal pain that radiates to under her right breast. Patient is still having bleeding and has changed her pad twice today.

## 2016-07-25 NOTE — Discharge Instructions (Signed)

## 2016-07-25 NOTE — MAU Provider Note (Signed)
History     CSN: [id number]  Arrival date and time: 07/25/16 1821   First Provider Initiated Contact with Patient 07/25/16 1856      Chief Complaint  Patient presents with  . Abdominal Pain   HPI  Patient is a 30 year old G8 P2 who is status post a D&C for spontaneous abortion on 05/18/2016. She has had right-sided abdominal pain for some time even preceding her D&C and pregnancy. Ever since her D&C she's had irregular periods with heavy bleeding and minimal intermenstrual periods. She reports that she had an ultrasound approximately 1 month ago in her primary obstetrician's office which showed a cyst which she believes is likely resolved. She reports this pain although chronic at times it is worse at times and is better. It got significantly worse last night and began to radiate up and down her whole right side. She reports some minimal nausea and chills associated with it.    Past Medical History:  Diagnosis Date  . Anemia   . Asthma   . Breast cyst   . Deep vein thrombosis (DVT) ([place])   . Environmental allergies   . Headache   . Hip discomfort   . Ovarian cyst     Past Surgical History:  Procedure Laterality Date  . APPENDECTOMY    . CESAREAN SECTION    . DILATION AND CURETTAGE OF UTERUS     1 WEEK AGO  . DILATION AND EVACUATION Bilateral 05/18/2016   Procedure: DILATATION AND EVACUATION;  Surgeon: Bobbye Charleston, MD;  Location: [facility];  Service: Gynecology;  Laterality: Bilateral;  . ECTOPIC PREGNANCY SURGERY    . etopi    . GASTRIC BYPASS    . INDUCED ABORTION    . OVARIAN CYST SURGERY      Family History  Problem Relation Age of Onset  . Cancer Mother   . Hypertension Mother   . Cancer Father   . Hypertension Maternal Uncle   . Diabetes Paternal Aunt   . Cancer Maternal Grandmother     Social History  Substance Use Topics  . Smoking status: Never Smoker  . Smokeless tobacco: Never Used  . Alcohol use 0.6 oz/week    1 Glasses of wine per week   Comment: OCCASSIONAL    Allergies:  Allergies  Allergen Reactions  . Penicillins Hives    Has patient had a PCN reaction causing immediate rash, facial/tongue/throat swelling, SOB or lightheadedness with hypotension: Yes Has patient had a PCN reaction causing severe rash involving mucus membranes or skin necrosis: No Has patient had a PCN reaction that required hospitalization No Has patient had a PCN reaction occurring within the last 10 years: Yes If all of the above answers are "NO", then may proceed with Cephalosporin use.    . Shellfish Allergy Anaphylaxis    Patient is allergic to all seafood.  . Other Swelling    Mayonnaise causes swelling of the tongue.  Marland Kitchen Peanut Butter Flavor Hives  . Histamine Other (See Comments)    Patient is tested positive in allergy testing to Control-histamine 1.  . Voltaren [Diclofenac] Rash and Other (See Comments)    Volatren gel causes blisters.    Prescriptions Prior to Admission  Medication Sig Dispense Refill Last Dose  . albuterol (PROVENTIL HFA;VENTOLIN HFA) 108 (90 BASE) MCG/ACT inhaler Inhale 2 puffs into the lungs every 6 (six) hours as needed for wheezing or shortness of breath.   Past Month at Unknown time  . cetirizine (ZYRTEC) 10 MG tablet Take  10-30 mg by mouth at bedtime.    07/24/2016 at Unknown time  . dicyclomine (BENTYL) 10 MG capsule Take 10 mg by mouth 3 (three) times daily as needed for spasms.   Past Month at Unknown time  . enoxaparin (LOVENOX) 40 MG/0.4ML injection Inject 40 mg into the skin daily.   Past Month at Unknown time  . EPINEPHrine (EPIPEN IJ) Inject 1 each as directed once.   june 17 at Unknown time  . ferrous sulfate 325 (65 FE) MG tablet Take 325 mg by mouth daily.    07/24/2016 at Unknown time  . fluticasone (FLONASE) 50 MCG/ACT nasal spray Place 1 spray into both nostrils daily as needed for allergies or rhinitis.   Past Week at Unknown time  . gabapentin (NEURONTIN) 100 MG capsule Take 100 mg by mouth 3  (three) times daily as needed (pain). Depends on pain level if takes 100 mg or 300 mg   Past Month at Unknown time  . HYDROcodone-acetaminophen (NORCO/VICODIN) 5-325 MG tablet Take 1 tablet by mouth every 6 (six) hours as needed for moderate pain.   07/24/2016 at Unknown time  . Hyprom-Naphaz-Polysorb-Zn Sulf (CLEAR EYES COMPLETE OP) Apply 1 drop to eye 2 (two) times daily as needed (for dryness and irritation).   Past Month at Unknown time  . ibuprofen (ADVIL,MOTRIN) 200 MG tablet Take 800 mg by mouth every 6 (six) hours as needed for moderate pain.   07/24/2016 at Unknown time  . montelukast (SINGULAIR) 10 MG tablet Take 1 tablet (10 mg total) by mouth daily. 30 tablet 3 07/24/2016 at Unknown time  . oxyCODONE-acetaminophen (PERCOCET/ROXICET) 5-325 MG tablet Take 1-2 tablets by mouth every 6 (six) hours as needed for severe pain. 6 tablet 0 07/25/2016 at Unknown time  . ranitidine (ZANTAC) 300 MG tablet Take 300 mg by mouth 3 times/day as needed-between meals & bedtime for heartburn.    07/24/2016 at Unknown time  . traMADol (ULTRAM) 50 MG tablet Take 50 mg by mouth every 6 (six) hours as needed for moderate pain.   07/25/2016 at Unknown time  . triamcinolone cream (KENALOG) 0.5 % Apply 1 application topically 3 (three) times daily as needed (For rash.).   07/24/2016 at Unknown time    Review of Systems  Constitutional: Negative for chills and fever.  HENT: Negative for congestion.   Eyes: Negative for blurred vision and double vision.  Respiratory: Negative for cough and shortness of breath.   Cardiovascular: Negative for chest pain and palpitations.  Gastrointestinal: Positive for abdominal pain and nausea. Negative for constipation, diarrhea, heartburn and vomiting.  Genitourinary: Negative for dysuria, frequency and urgency.  Musculoskeletal: Positive for back pain. Negative for myalgias and neck pain.  Skin: Negative for itching and rash.  Neurological: Negative for dizziness, tingling and  headaches.  Endo/Heme/Allergies: Negative for environmental allergies. Does not bruise/bleed easily.   Physical Exam   Blood pressure 131/85, pulse 76, temperature 98.2 F (36.8 C), temperature source Oral, resp. rate 18, last menstrual period 02/20/2016, unknown if currently breastfeeding.  Physical Exam  Vitals reviewed. Constitutional: She is oriented to person, place, and time. She appears well-developed and well-nourished.  HENT:  Head: Normocephalic and atraumatic.  Cardiovascular: Normal rate and intact distal pulses.   Respiratory: Effort normal and breath sounds normal. No respiratory distress.  GI: Soft. Bowel sounds are normal. She exhibits no distension. There is no rebound and no guarding.  Moderate tenderness to palpation in the right upper and lower quadrants. Worse in the right  upper quadrant and epigastric area. Positive Murphy sign  Neurological: She is alert and oriented to person, place, and time.  Skin: Skin is warm and dry.  Psychiatric: She has a normal mood and affect. Her behavior is normal.    MAU Course  Procedures  MDM   Assessment and Plan   Jacquiline Doe 07/25/2016, 7:19 PM   US Abdomen Complete  Result Date: 07/25/2016 CLINICAL DATA:  Right-sided pain status post D&C EXAM: ABDOMEN ULTRASOUND COMPLETE COMPARISON:  None. FINDINGS: Gallbladder: No gallstones or wall thickening visualized. No sonographic Murphy sign noted by sonographer. Common bile duct: Diameter: 6 mm Liver: No focal lesion identified. Within normal limits in parenchymal echogenicity. IVC: No abnormality visualized. Pancreas: Not well visualized. Spleen: Size and appearance within normal limits. Right Kidney: Length: 8.7 cm. Echogenicity within normal limits. No mass or hydronephrosis visualized. Left Kidney: Length: 9.5 cm. Echogenicity within normal limits. No mass or hydronephrosis visualized. Abdominal aorta: No aneurysm visualized. Other findings: None. IMPRESSION: Normal abdomen  ultrasound. Electronically Signed   By: Abelardo Diesel M.D.   On: 07/25/2016 20:04    1. Abdominal pain, chronic, right lower quadrant   2. Abdominal pain     Consult Dr Philis Pique.  No evidence of acute abdomen or infection.  No evidence of Gyn source of pt chronic pain.  Pt to f/u with primary care provider.  F/U with Dr Philis Pique for Gyn concerns.  LEFTWICH-KIRBY, Tanae Petrosky, CNM 9:17 PM

## 2016-10-02 ENCOUNTER — Other Ambulatory Visit: Payer: Self-pay | Admitting: Obstetrics and Gynecology

## 2016-10-02 DIAGNOSIS — R1032 Left lower quadrant pain: Secondary | ICD-10-CM

## 2017-02-24 ENCOUNTER — Emergency Department (HOSPITAL_COMMUNITY): Payer: Commercial Managed Care - PPO

## 2017-02-24 ENCOUNTER — Emergency Department (HOSPITAL_COMMUNITY)
Admission: EM | Admit: 2017-02-24 | Discharge: 2017-02-24 | Disposition: A | Discharge status: Home / Self Care | Payer: Commercial Managed Care - PPO | Attending: Emergency Medicine | Admitting: Emergency Medicine

## 2017-02-24 ENCOUNTER — Emergency Department (HOSPITAL_BASED_OUTPATIENT_CLINIC_OR_DEPARTMENT_OTHER)
Admit: 2017-02-24 | Discharge: 2017-02-24 | Disposition: A | Discharge status: Home / Self Care | Payer: Commercial Managed Care - PPO | Attending: Emergency Medicine | Admitting: Emergency Medicine

## 2017-02-24 ENCOUNTER — Encounter (HOSPITAL_COMMUNITY): Payer: Self-pay | Admitting: *Deleted

## 2017-02-24 DIAGNOSIS — M79609 Pain in unspecified limb: Secondary | ICD-10-CM | POA: Diagnosis not present

## 2017-02-24 DIAGNOSIS — J45909 Unspecified asthma, uncomplicated: Secondary | ICD-10-CM | POA: Insufficient documentation

## 2017-02-24 DIAGNOSIS — Z91010 Allergy to peanuts: Secondary | ICD-10-CM | POA: Insufficient documentation

## 2017-02-24 DIAGNOSIS — B349 Viral infection, unspecified: Secondary | ICD-10-CM | POA: Insufficient documentation

## 2017-02-24 DIAGNOSIS — K529 Noninfective gastroenteritis and colitis, unspecified: Secondary | ICD-10-CM | POA: Diagnosis not present

## 2017-02-24 DIAGNOSIS — Z79899 Other long term (current) drug therapy: Secondary | ICD-10-CM | POA: Insufficient documentation

## 2017-02-24 DIAGNOSIS — M79605 Pain in left leg: Secondary | ICD-10-CM | POA: Insufficient documentation

## 2017-02-24 DIAGNOSIS — M79606 Pain in leg, unspecified: Secondary | ICD-10-CM

## 2017-02-24 DIAGNOSIS — R112 Nausea with vomiting, unspecified: Secondary | ICD-10-CM | POA: Diagnosis present

## 2017-02-24 DIAGNOSIS — R079 Chest pain, unspecified: Secondary | ICD-10-CM | POA: Insufficient documentation

## 2017-02-24 LAB — CBC
HCT: 30.2 % — ABNORMAL LOW (ref 36.0–46.0)
Hemoglobin: 9.2 g/dL — ABNORMAL LOW (ref 12.0–15.0)
MCH: 22.5 pg — ABNORMAL LOW (ref 26.0–34.0)
MCHC: 30.5 g/dL (ref 30.0–36.0)
MCV: 74 fL — ABNORMAL LOW (ref 78.0–100.0)
Platelets: 353 10*3/uL (ref 150–400)
RBC: 4.08 MIL/uL (ref 3.87–5.11)
RDW: 15.8 % — ABNORMAL HIGH (ref 11.5–15.5)
WBC: 6.3 10*3/uL (ref 4.0–10.5)

## 2017-02-24 LAB — URINALYSIS, ROUTINE W REFLEX MICROSCOPIC
Bilirubin Urine: NEGATIVE
Glucose, UA: NEGATIVE mg/dL
Hgb urine dipstick: NEGATIVE
Ketones, ur: NEGATIVE mg/dL
Leukocytes, UA: NEGATIVE
Nitrite: NEGATIVE
Protein, ur: NEGATIVE mg/dL
Specific Gravity, Urine: 1.018 (ref 1.005–1.030)
pH: 7 (ref 5.0–8.0)

## 2017-02-24 LAB — BASIC METABOLIC PANEL
Anion gap: 4 — ABNORMAL LOW (ref 5–15)
BUN: 9 mg/dL (ref 6–20)
CO2: 25 mmol/L (ref 22–32)
Calcium: 8.7 mg/dL — ABNORMAL LOW (ref 8.9–10.3)
Chloride: 107 mmol/L (ref 101–111)
Creatinine, Ser: 0.78 mg/dL (ref 0.44–1.00)
GFR calc Af Amer: >60 mL/min (ref >60)
GFR calc non Af Amer: >60 mL/min (ref >60)
Glucose, Bld: 103 mg/dL — ABNORMAL HIGH (ref 65–99)
Potassium: 3.9 mmol/L (ref 3.5–5.1)
Sodium: 136 mmol/L (ref 135–145)

## 2017-02-24 LAB — I-STAT TROPONIN, ED: Troponin i, poc: 0 ng/mL (ref 0.00–0.08)

## 2017-02-24 LAB — D-DIMER, QUANTITATIVE: D-Dimer, Quant: <0.27 ug/mL-FEU (ref 0.00–0.50)

## 2017-02-24 LAB — POC URINE PREG, ED: Preg Test, Ur: NEGATIVE

## 2017-02-24 MED ORDER — SODIUM CHLORIDE 0.9 % IV BOLUS (SEPSIS)
500.0000 mL | Freq: Once | INTRAVENOUS | Status: AC
  Administered 2017-02-24: 500 mL via INTRAVENOUS

## 2017-02-24 MED ORDER — DIPHENOXYLATE-ATROPINE 2.5-0.025 MG PO TABS: 1.0000 | ORAL_TABLET | Freq: Four times a day (QID) | ORAL | 0 refills | Status: DC | PRN

## 2017-02-24 MED ORDER — SODIUM CHLORIDE 0.9 % IV SOLN: Freq: Once | INTRAVENOUS | Status: DC

## 2017-02-24 MED ORDER — ONDANSETRON 4 MG PO TBDP: 4.0000 mg | ORAL_TABLET | Freq: Three times a day (TID) | ORAL | 0 refills | Status: DC | PRN

## 2017-02-24 MED ORDER — IOPAMIDOL (ISOVUE-370) INJECTION 76%
INTRAVENOUS | Status: AC
  Administered 2017-02-24: 100 mL
  Filled 2017-02-24: qty 100

## 2017-02-24 MED ORDER — DIPHENOXYLATE-ATROPINE 2.5-0.025 MG PO TABS
2.0000 | ORAL_TABLET | Freq: Once | ORAL | Status: AC
  Administered 2017-02-24: 2 via ORAL
  Filled 2017-02-24: qty 2

## 2017-02-24 MED ORDER — ONDANSETRON HCL 4 MG/2ML IJ SOLN
4.0000 mg | Freq: Once | INTRAMUSCULAR | Status: AC
  Administered 2017-02-24: 4 mg via INTRAVENOUS
  Filled 2017-02-24: qty 2

## 2017-02-24 MED ORDER — GLYCOPYRROLATE 0.2 MG/ML IJ SOLN
0.1000 mg | Freq: Once | INTRAMUSCULAR | Status: AC
  Administered 2017-02-24: 0.1 mg via INTRAVENOUS
  Filled 2017-02-24: qty 1

## 2017-02-24 MED ORDER — IOPAMIDOL (ISOVUE-300) INJECTION 61%
INTRAVENOUS | Status: AC
  Filled 2017-02-24: qty 100

## 2017-02-24 NOTE — ED Provider Notes (Addendum)
[facility] DEPT Provider Note   CSN: [id number] Arrival date & time: 02/24/17  1403     History   Chief Complaint Chief Complaint  Patient presents with  . Leg Pain  . Chest Pain    HPI Nina Wood is a 31 y.o. female. Chief complaint is chest pain radiating to right arm, as well as abdominal pain with nausea vomiting and diarrhea.  HPI:  She describes 2 days of symptoms. Worse today. Nausea vomiting or diarrhea. Has chest pain in her right shoulder radiating to her right arm. Also states she has left leg pain. She reports a history of previous ovarian cysts but states she does not feel like her pain in her abdomen is from a cyst. Has history of a DVT in her left leg was treated with 6 months of eloquist.   She's had several episodes of nausea and vomiting and loose stools today. Heme-negative nonbilious emesis. No blood pus or mucus in her stool. She is concerned that she may have "another clot in my leg, or a blood clot in my chest". She did not embolize with her previous DVT.  Denies pregnancy. Denies urinary symptoms.  She describes her pain in her chest is "horrific". Describes her abdomen pain is "exploding and excruciating".    Past Medical History:  Diagnosis Date  . Anemia   . Asthma   . Breast cyst   . Deep vein thrombosis (DVT) ([place])   . Environmental allergies   . Headache   . Hip discomfort   . Ovarian cyst     There are no active problems to display for this patient.   Past Surgical History:  Procedure Laterality Date  . APPENDECTOMY    . CESAREAN SECTION    . DILATION AND CURETTAGE OF UTERUS     1 WEEK AGO  . DILATION AND EVACUATION Bilateral 05/18/2016   Procedure: DILATATION AND EVACUATION;  Surgeon: Bobbye Charleston, MD;  Location: [facility];  Service: Gynecology;  Laterality: Bilateral;  . ECTOPIC PREGNANCY SURGERY    . etopi    . GASTRIC BYPASS    . INDUCED ABORTION    . OVARIAN CYST SURGERY      OB History    Gravida Para Term Preterm  AB Living   8 2 2  0 6 2   SAB TAB Ectopic Multiple Live Births   4 1 1  0         Home Medications    Prior to Admission medications   Medication Sig Start Date End Date Taking? Authorizing Provider  albuterol (PROAIR HFA) 108 (90 Base) MCG/ACT inhaler Inhale 2 puffs into the lungs every 6 (six) hours as needed for wheezing or shortness of breath.   Yes Historical Provider, MD  cetirizine (ZYRTEC) 10 MG tablet Take 20 mg by mouth at bedtime.    Yes Historical Provider, MD  EPINEPHrine (EPIPEN 2-PAK) 0.3 mg/0.3 mL IJ SOAJ injection Inject 0.3 mg into the muscle once as needed (severe allergic reactions).   Yes Historical Provider, MD  HYDROcodone-acetaminophen (NORCO) 7.5-325 MG tablet Take 0.5-1 tablets by mouth at bedtime as needed for moderate pain.   Yes Historical Provider, MD  Hyprom-Naphaz-Polysorb-Zn Sulf (CLEAR EYES COMPLETE OP) Place 1 drop into both eyes 2 (two) times daily as needed (dry eyes/itching).    Yes Historical Provider, MD  leuprolide (LUPRON) 3.75 MG injection Inject 3.75 mg into the muscle every 28 (twenty-eight) days.   Yes Historical Provider, MD  montelukast (SINGULAIR) 10 MG tablet  Take 1 tablet (10 mg total) by mouth daily. Patient taking differently: Take 10 mg by mouth at bedtime.  05/12/16  Yes Jiles Prows, MD  ranitidine (ZANTAC) 300 MG tablet Take 300 mg by mouth at bedtime.    Yes Historical Provider, MD  traMADol (ULTRAM) 50 MG tablet Take 50-100 mg by mouth every 4 (four) hours as needed for moderate pain.    Yes Historical Provider, MD  triamcinolone cream (KENALOG) 0.5 % Apply 1 application topically every 4 (four) hours as needed (rash/allergies).    Yes Historical Provider, MD  diphenoxylate-atropine (LOMOTIL) 2.5-0.025 MG tablet Take 1 tablet by mouth 4 (four) times daily as needed for diarrhea or loose stools. 02/24/17   Tanna Furry, MD  ondansetron (ZOFRAN ODT) 4 MG disintegrating tablet Take 1 tablet (4 mg total) by mouth every 8 (eight) hours as  needed for nausea. 02/24/17   Tanna Furry, MD  oxyCODONE-acetaminophen (PERCOCET/ROXICET) 5-325 MG tablet Take 1-2 tablets by mouth every 6 (six) hours as needed for severe pain. Patient not taking: Reported on 02/24/2017 05/19/16   Sharlett Iles, MD    Family History Family History  Problem Relation Age of Onset  . Cancer Mother   . Hypertension Mother   . Cancer Father   . Hypertension Maternal Uncle   . Diabetes Paternal Aunt   . Cancer Maternal Grandmother     Social History Social History  Substance Use Topics  . Smoking status: Never Smoker  . Smokeless tobacco: Never Used  . Alcohol use 0.6 oz/week    1 Glasses of wine per week     Comment: OCCASSIONAL     Allergies   Fish allergy; Penicillins; Shellfish allergy; Other; Peanut butter flavor; Histamine; and Voltaren [diclofenac]   Review of Systems Review of Systems  Constitutional: Negative for appetite change, chills, diaphoresis, fatigue and fever.  HENT: Negative for mouth sores, sore throat and trouble swallowing.   Eyes: Negative for visual disturbance.  Respiratory: Negative for cough, chest tightness, shortness of breath and wheezing.   Cardiovascular: Positive for chest pain.  Gastrointestinal: Positive for abdominal pain, diarrhea and nausea. Negative for abdominal distention and vomiting.  Endocrine: Negative for polydipsia, polyphagia and polyuria.  Genitourinary: Negative for dysuria, frequency and hematuria.  Musculoskeletal: Negative for gait problem.       Left leg pain  Skin: Negative for color change, pallor and rash.  Neurological: Negative for dizziness, syncope, light-headedness and headaches.  Hematological: Does not bruise/bleed easily.  Psychiatric/Behavioral: Negative for behavioral problems and confusion.     Physical Exam Updated Vital Signs BP 105/69   Pulse 84   Temp 99.4 F (37.4 C) (Oral)   Resp 14   Ht 4' 7.5" (1.41 m)   Wt 153 lb (69.4 kg)   LMP 02/06/2017  (Approximate)   SpO2 100%   BMI 34.92 kg/m   Physical Exam  Constitutional: She is oriented to person, place, and time. She appears well-developed and well-nourished. No distress.  Despite her eloquent descriptions of the degree of her pain she appears quite comfortable. She is taking and speaking on her phone and watching television. Appears in no distress. Not tachycardic or tachypneic. Afebrile.  HENT:  Head: Normocephalic.  Eyes: Conjunctivae are normal. Pupils are equal, round, and reactive to light. No scleral icterus.  Neck: Normal range of motion. Neck supple. No thyromegaly present.  Cardiovascular: Normal rate and regular rhythm.  Exam reveals no gallop and no friction rub.   No murmur heard. Pulmonary/Chest:  Effort normal and breath sounds normal. No respiratory distress. She has no wheezes. She has no rales.  Clear bilateral breath sounds. Nontender over the right lateral chest. No rash or vesicles.  Abdominal: Soft. Bowel sounds are normal. She exhibits no distension. There is no tenderness. There is no rebound.  No peritoneal irritation. Non distended.  Musculoskeletal: Normal range of motion.  Neurological: She is alert and oriented to person, place, and time.  Skin: Skin is warm and dry. No rash noted.  Psychiatric: She has a normal mood and affect. Her behavior is normal.     ED Treatments / Results  Labs (all labs ordered are listed, but only abnormal results are displayed) Labs Reviewed  BASIC METABOLIC PANEL - Abnormal; Notable for the following:       Result Value   Glucose, Bld 103 (*)    Calcium 8.7 (*)    Anion gap 4 (*)    All other components within normal limits  CBC - Abnormal; Notable for the following:    Hemoglobin 9.2 (*)    HCT 30.2 (*)    MCV 74.0 (*)    MCH 22.5 (*)    RDW 15.8 (*)    All other components within normal limits  D-DIMER, QUANTITATIVE (NOT AT [facility])  URINALYSIS, ROUTINE W REFLEX MICROSCOPIC  I-STAT TROPOININ, ED  POC URINE  PREG, ED    EKG  EKG Interpretation  Date/Time:  Saturday February 24 2017 14:11:40 EDT Ventricular Rate:  82 PR Interval:  176 QRS Duration: 84 QT Interval:  376 QTC Calculation: 439 R Axis:   50 Text Interpretation:  Normal sinus rhythm Low voltage QRS Cannot rule out Anterior infarct , age undetermined Abnormal ECG Confirmed by Jeneen Rinks  MD, [place] ([id number]) on 02/24/2017 4:18:25 PM       Radiology Dg Chest 2 View  Result Date: 02/24/2017 CLINICAL DATA:  31 year old female with 1 week of intermittent chest pain and flank, abdominal pain. Initial encounter. EXAM: CHEST  2 VIEW COMPARISON:  CTA chest 10/31/2016 and earlier. FINDINGS: Lung volumes are within normal limits. Cardiac size at the upper limits of normal. Other mediastinal contours are within normal limits. Visualized tracheal air column is within normal limits. The lungs are clear. No pneumothorax or pleural effusion. No acute osseous abnormality identified. Mild levoconvex curvature of the thoracic spine. Negative visible bowel gas pattern. IMPRESSION: Negative.  No acute cardiopulmonary abnormality. Electronically Signed   By: Genevie Ann M.D.   On: 02/24/2017 15:38   Ct Angio Chest Pe W Or Wo Contrast  Result Date: 02/24/2017 CLINICAL DATA:  Chest pain for 2 days EXAM: CT ANGIOGRAPHY CHEST WITH CONTRAST TECHNIQUE: Multidetector CT imaging of the chest was performed using the standard protocol during bolus administration of intravenous contrast. Multiplanar CT image reconstructions and MIPs were obtained to evaluate the vascular anatomy. CONTRAST:  80 mL of Isovue 370. COMPARISON:  None. FINDINGS: Cardiovascular: Some motion artifact is noted. The thoracic aorta shows no aneurysmal dilatation or dissection. The pulmonary artery shows no filling defect to suggest pulmonary embolism. Mild cardiomegaly is seen no coronary calcifications are noted. No pericardial effusion is noted. Mediastinum/Nodes: The thoracic inlet is within normal limits. No  significant hilar or mediastinal adenopathy is noted. The esophagus as visualize is within normal limits. Lungs/Pleura: Lungs are clear. No pleural effusion or pneumothorax. Upper Abdomen: Within normal limits. Musculoskeletal: No chest wall abnormality. No acute or significant osseous findings. Review of the MIP images confirms the above findings. IMPRESSION: No evidence  of pulmonary embolism.  No acute abnormality noted. Electronically Signed   By: Inez Catalina M.D.   On: 02/24/2017 20:36    Procedures Procedures (including critical care time)  Medications Ordered in ED Medications  0.9 %  sodium chloride infusion (not administered)  sodium chloride 0.9 % bolus 500 mL (500 mLs Intravenous New Bag/Given 02/24/17 1830)  glycopyrrolate (ROBINUL) injection 0.1 mg (0.1 mg Intravenous Given 02/24/17 1807)  ondansetron (ZOFRAN) injection 4 mg (4 mg Intravenous Given 02/24/17 1807)  diphenoxylate-atropine (LOMOTIL) 2.5-0.025 MG per tablet 2 tablet (2 tablets Oral Given 02/24/17 1809)  iopamidol (ISOVUE-370) 76 % injection (100 mLs  Contrast Given 02/24/17 1938)     Initial Impression / Assessment and Plan / ED Course  I have reviewed the triage vital signs and the nursing notes.  Pertinent labs & imaging results that were available during my care of the patient were reviewed by me and considered in my medical decision making (see chart for details).    Patient given IV fluids. Given Robinul for cramping abdominal pain. Given by mouth Lomotil. No additional diarrhea. Given IV Zofran and no additional vomiting. She was asking for food and fluids. Ultrasound of the leg, CTA of the chest are negative for PE or acute DVT. Plan is home. Expectant management for her probable viral syndrome.  Final Clinical Impressions(s) / ED Diagnoses   Final diagnoses:  Leg pain  Chest pain, unspecified type  Viral syndrome  Gastroenteritis    New Prescriptions New Prescriptions   DIPHENOXYLATE-ATROPINE (LOMOTIL)  2.5-0.025 MG TABLET    Take 1 tablet by mouth 4 (four) times daily as needed for diarrhea or loose stools.   ONDANSETRON (ZOFRAN ODT) 4 MG DISINTEGRATING TABLET    Take 1 tablet (4 mg total) by mouth every 8 (eight) hours as needed for nausea.     Tanna Furry, MD 02/24/17 [id number]    Tanna Furry, MD 02/24/17 6803    Tanna Furry, MD 02/24/17 2249

## 2017-02-24 NOTE — ED Notes (Signed)
Patient Alert and oriented X4. Stable and ambulatory. Patient verbalized understanding of the discharge instructions.  Patient belongings were taken by the patient.  

## 2017-02-24 NOTE — ED Triage Notes (Addendum)
Pt reports L leg pain & mid CP radiating to R arm, pt denies SOB, pt reports n/v/d, pt reports CP onset x 2-3 days worsening last night, pt reports x 1 vomiting in the last 24 hrs & x3 loose stools in the last 24 hrs, pt hx of DVT with Lovenox use completed last month after a 6 mth regimen, no L leg swelling noted, pt A&O x4

## 2017-02-24 NOTE — Progress Notes (Signed)
VASCULAR LAB PRELIMINARY  PRELIMINARY  PRELIMINARY  PRELIMINARY  Left lower extremity venous duplex completed.    Preliminary report:  There is no DVT or SVT noted in the left lower extremity.  Gave report to Dr. Zettie Cooley, [facility], RVT 02/24/2017, 6:44 PM

## 2017-04-06 ENCOUNTER — Emergency Department (HOSPITAL_COMMUNITY): Payer: Commercial Managed Care - PPO

## 2017-04-06 ENCOUNTER — Encounter (HOSPITAL_COMMUNITY): Payer: Self-pay | Admitting: Emergency Medicine

## 2017-04-06 ENCOUNTER — Emergency Department (HOSPITAL_COMMUNITY)
Admission: EM | Admit: 2017-04-06 | Discharge: 2017-04-07 | Disposition: A | Discharge status: Home / Self Care | Payer: Commercial Managed Care - PPO | Attending: Emergency Medicine | Admitting: Emergency Medicine

## 2017-04-06 DIAGNOSIS — Z79818 Long term (current) use of other agents affecting estrogen receptors and estrogen levels: Secondary | ICD-10-CM | POA: Insufficient documentation

## 2017-04-06 DIAGNOSIS — J45909 Unspecified asthma, uncomplicated: Secondary | ICD-10-CM | POA: Diagnosis not present

## 2017-04-06 DIAGNOSIS — Z91010 Allergy to peanuts: Secondary | ICD-10-CM | POA: Diagnosis not present

## 2017-04-06 DIAGNOSIS — Z79899 Other long term (current) drug therapy: Secondary | ICD-10-CM | POA: Diagnosis not present

## 2017-04-06 DIAGNOSIS — R1084 Generalized abdominal pain: Secondary | ICD-10-CM | POA: Insufficient documentation

## 2017-04-06 DIAGNOSIS — R109 Unspecified abdominal pain: Secondary | ICD-10-CM

## 2017-04-06 LAB — URINALYSIS, ROUTINE W REFLEX MICROSCOPIC
Bacteria, UA: NONE SEEN
Bilirubin Urine: NEGATIVE
Glucose, UA: NEGATIVE mg/dL
Ketones, ur: 5 mg/dL — AB
Leukocytes, UA: NEGATIVE
Nitrite: NEGATIVE
Protein, ur: 100 mg/dL — AB
Specific Gravity, Urine: 1.03 (ref 1.005–1.030)
pH: 6 (ref 5.0–8.0)

## 2017-04-06 LAB — POC URINE PREG, ED: Preg Test, Ur: NEGATIVE

## 2017-04-06 NOTE — ED Notes (Signed)
Patient updated on delays 

## 2017-04-06 NOTE — ED Triage Notes (Signed)
Pt presents to ED for assessment of right flank/skin pain x 1+ month, but worsening this morning "to become unbearable".  Patient with a hx of fibromyalgia, endometriosis.  Denies changes in urination or bowel.  Hx of gastric bypass 6 years ago.  States skin is sensitive to touch, and pain increases with movement.  Due to location, patient also c/o pain with inspiration.

## 2017-04-06 NOTE — ED Notes (Signed)
Spoke to [employer] about patient.  Orders and will see pt.

## 2017-04-07 LAB — COMPREHENSIVE METABOLIC PANEL
ALT: 11 U/L — ABNORMAL LOW (ref 14–54)
AST: 25 U/L (ref 15–41)
Albumin: 3.3 g/dL — ABNORMAL LOW (ref 3.5–5.0)
Alkaline Phosphatase: 53 U/L (ref 38–126)
Anion gap: 7 (ref 5–15)
BUN: 13 mg/dL (ref 6–20)
CO2: 23 mmol/L (ref 22–32)
Calcium: 8.3 mg/dL — ABNORMAL LOW (ref 8.9–10.3)
Chloride: 108 mmol/L (ref 101–111)
Creatinine, Ser: 0.59 mg/dL (ref 0.44–1.00)
GFR calc Af Amer: >60 mL/min (ref >60)
GFR calc non Af Amer: >60 mL/min (ref >60)
Glucose, Bld: 99 mg/dL (ref 65–99)
Potassium: 3.9 mmol/L (ref 3.5–5.1)
Sodium: 138 mmol/L (ref 135–145)
Total Bilirubin: 0.5 mg/dL (ref 0.3–1.2)
Total Protein: 5.9 g/dL — ABNORMAL LOW (ref 6.5–8.1)

## 2017-04-07 LAB — CBC WITH DIFFERENTIAL/PLATELET
Basophils Absolute: 0 10*3/uL (ref 0.0–0.1)
Basophils Relative: 0 %
Eosinophils Absolute: 0.1 10*3/uL (ref 0.0–0.7)
Eosinophils Relative: 1 %
HCT: 28.3 % — ABNORMAL LOW (ref 36.0–46.0)
Hemoglobin: 8.7 g/dL — ABNORMAL LOW (ref 12.0–15.0)
Lymphocytes Relative: 38 %
Lymphs Abs: 2.3 10*3/uL (ref 0.7–4.0)
MCH: 23.1 pg — ABNORMAL LOW (ref 26.0–34.0)
MCHC: 30.7 g/dL (ref 30.0–36.0)
MCV: 75.1 fL — ABNORMAL LOW (ref 78.0–100.0)
Monocytes Absolute: 0.3 10*3/uL (ref 0.1–1.0)
Monocytes Relative: 5 %
Neutro Abs: 3.4 10*3/uL (ref 1.7–7.7)
Neutrophils Relative %: 56 %
Platelets: 334 10*3/uL (ref 150–400)
RBC: 3.77 MIL/uL — ABNORMAL LOW (ref 3.87–5.11)
RDW: 16.7 % — ABNORMAL HIGH (ref 11.5–15.5)
WBC: 6.1 10*3/uL (ref 4.0–10.5)

## 2017-04-07 LAB — LIPASE, BLOOD: Lipase: 29 U/L (ref 11–51)

## 2017-04-07 MED ORDER — TRAMADOL HCL 50 MG PO TABS: 50.0000 mg | ORAL_TABLET | Freq: Four times a day (QID) | ORAL | 0 refills | Status: DC | PRN

## 2017-04-07 MED ORDER — ONDANSETRON HCL 4 MG PO TABS: 4.0000 mg | ORAL_TABLET | Freq: Four times a day (QID) | ORAL | 0 refills | Status: DC

## 2017-04-07 NOTE — ED Provider Notes (Signed)
[facility] DEPT Provider Note   CSN: [id number] Arrival date & time: 04/06/17  2003     History   Chief Complaint Chief Complaint  Patient presents with  . Flank Pain    HPI Nina Wood is a 31 y.o. female.  Patient presents to the emergency department with chief complaint of right side pain. She states that she has been having the symptoms for about a month. She states that this morning her symptoms worsen. She denies any fever or chills, she does report some nausea, but denies any vomiting. She states that her symptoms are worsened with palpation and movement as well as with deep breathing. She denies any productive cough or recent illness. She has not tried taking anything for the symptoms. She is currently on her period. She denies any dysuria. There are no other associated symptoms.   The history is provided by the patient. No language interpreter was used.    Past Medical History:  Diagnosis Date  . Anemia   . Asthma   . Breast cyst   . Deep vein thrombosis (DVT) ([place])   . Environmental allergies   . Headache   . Hip discomfort   . Ovarian cyst     There are no active problems to display for this patient.   Past Surgical History:  Procedure Laterality Date  . APPENDECTOMY    . CESAREAN SECTION    . DILATION AND CURETTAGE OF UTERUS     1 WEEK AGO  . DILATION AND EVACUATION Bilateral 05/18/2016   Procedure: DILATATION AND EVACUATION;  Surgeon: Bobbye Charleston, MD;  Location: [facility];  Service: Gynecology;  Laterality: Bilateral;  . ECTOPIC PREGNANCY SURGERY    . etopi    . GASTRIC BYPASS    . INDUCED ABORTION    . OVARIAN CYST SURGERY      OB History    Gravida Para Term Preterm AB Living   8 2 2  0 6 2   SAB TAB Ectopic Multiple Live Births   4 1 1  0         Home Medications    Prior to Admission medications   Medication Sig Start Date End Date Taking? Authorizing Provider  albuterol (PROAIR HFA) 108 (90 Base) MCG/ACT inhaler Inhale 2 puffs  into the lungs every 6 (six) hours as needed for wheezing or shortness of breath.    [provider]  cetirizine (ZYRTEC) 10 MG tablet Take 20 mg by mouth at bedtime.     [provider]  diphenoxylate-atropine (LOMOTIL) 2.5-0.025 MG tablet Take 1 tablet by mouth 4 (four) times daily as needed for diarrhea or loose stools. 02/24/17   Tanna Furry, MD  EPINEPHrine (EPIPEN 2-PAK) 0.3 mg/0.3 mL IJ SOAJ injection Inject 0.3 mg into the muscle once as needed (severe allergic reactions).    [provider]  HYDROcodone-acetaminophen (NORCO) 7.5-325 MG tablet Take 0.5-1 tablets by mouth at bedtime as needed for moderate pain.    [provider]  Hyprom-Naphaz-Polysorb-Zn Sulf (CLEAR EYES COMPLETE OP) Place 1 drop into both eyes 2 (two) times daily as needed (dry eyes/itching).     [provider]  leuprolide (LUPRON) 3.75 MG injection Inject 3.75 mg into the muscle every 28 (twenty-eight) days.    [provider]  montelukast (SINGULAIR) 10 MG tablet Take 1 tablet (10 mg total) by mouth daily. Patient taking differently: Take 10 mg by mouth at bedtime.  05/12/16   Kozlow, Donnamarie Poag, MD  ondansetron (ZOFRAN ODT)  4 MG disintegrating tablet Take 1 tablet (4 mg total) by mouth every 8 (eight) hours as needed for nausea. 02/24/17   Tanna Furry, MD  oxyCODONE-acetaminophen (PERCOCET/ROXICET) 5-325 MG tablet Take 1-2 tablets by mouth every 6 (six) hours as needed for severe pain. Patient not taking: Reported on 02/24/2017 05/19/16   Little, Wenda Overland, MD  ranitidine (ZANTAC) 300 MG tablet Take 300 mg by mouth at bedtime.     [provider]  traMADol (ULTRAM) 50 MG tablet Take 50-100 mg by mouth every 4 (four) hours as needed for moderate pain.     [provider]  triamcinolone cream (KENALOG) 0.5 % Apply 1 application topically every 4 (four) hours as needed (rash/allergies).     [provider]    Family History Family History    Problem Relation Age of Onset  . Cancer Mother   . Hypertension Mother   . Cancer Father   . Hypertension Maternal Uncle   . Diabetes Paternal Aunt   . Cancer Maternal Grandmother     Social History Social History  Substance Use Topics  . Smoking status: Never Smoker  . Smokeless tobacco: Never Used  . Alcohol use 0.6 oz/week    1 Glasses of wine per week     Comment: OCCASSIONAL     Allergies   Fish allergy; Penicillins; Shellfish allergy; Other; Peanut butter flavor; Histamine; and Voltaren [diclofenac]   Review of Systems Review of Systems  All other systems reviewed and are negative.    Physical Exam Updated Vital Signs BP 121/85 (BP Location: Left Arm)   Pulse 78   Temp 99.1 F (37.3 C) (Oral)   Resp 18   Ht 4\' 7"  (1.397 m)   Wt 70.3 kg (155 lb)   LMP 04/06/2017   SpO2 100%   BMI 36.03 kg/m   Physical Exam  Constitutional: She is oriented to person, place, and time. She appears well-developed and well-nourished.  HENT:  Head: Normocephalic and atraumatic.  Eyes: Conjunctivae and EOM are normal. Pupils are equal, round, and reactive to light.  Neck: Normal range of motion. Neck supple.  Cardiovascular: Normal rate and regular rhythm.  Exam reveals no gallop and no friction rub.   No murmur heard. Pulmonary/Chest: Effort normal and breath sounds normal. No respiratory distress. She has no wheezes. She has no rales. She exhibits no tenderness.  Abdominal: Soft. Bowel sounds are normal. She exhibits no distension and no mass. There is tenderness. There is no rebound and no guarding.  Right upper quadrant moderately tender to palpation, no other focal abdominal tenderness  Musculoskeletal: Normal range of motion. She exhibits no edema or tenderness.  Neurological: She is alert and oriented to person, place, and time.  Skin: Skin is warm and dry.  Psychiatric: She has a normal mood and affect. Her behavior is normal. Judgment and thought content normal.   Nursing note and vitals reviewed.    ED Treatments / Results  Labs (all labs ordered are listed, but only abnormal results are displayed) Labs Reviewed  URINALYSIS, ROUTINE W REFLEX MICROSCOPIC - Abnormal; Notable for the following:       Result Value   APPearance HAZY (*)    Hgb urine dipstick LARGE (*)    Ketones, ur 5 (*)    Protein, ur 100 (*)    Squamous Epithelial / LPF 0-5 (*)    All other components within normal limits  CBC WITH DIFFERENTIAL/PLATELET  COMPREHENSIVE METABOLIC PANEL  LIPASE, BLOOD  POC  URINE PREG, ED    EKG  EKG Interpretation None       Radiology Dg Chest 2 View  Result Date: 04/06/2017 CLINICAL DATA:  Right flank and skin pain for 1 month, worse this morning. Pain with inspiration. EXAM: CHEST  2 VIEW COMPARISON:  02/24/2017 FINDINGS: The heart size and mediastinal contours are within normal limits. Both lungs are clear. The visualized skeletal structures are unremarkable. IMPRESSION: No active cardiopulmonary disease. Electronically Signed   By: Lucienne Capers M.D.   On: 04/06/2017 21:18   Dg Lumbar Spine Complete  Result Date: 04/06/2017 CLINICAL DATA:  Right flank and skin pain for 1 month but worse this morning. History of fibromyalgia. Gastric bypass 6 years ago. Pain on inspiration. EXAM: LUMBAR SPINE - COMPLETE 4+ VIEW COMPARISON:  03/15/2017 FINDINGS: There is no evidence of lumbar spine fracture. Alignment is normal. Intervertebral disc spaces are maintained. IMPRESSION: Normal alignment of the lumbar spine. No acute displaced fractures identified. Electronically Signed   By: Lucienne Capers M.D.   On: 04/06/2017 21:17    Procedures Procedures (including critical care time)  Medications Ordered in ED Medications - No data to display   Initial Impression / Assessment and Plan / ED Course  I have reviewed the triage vital signs and the nursing notes.  Pertinent labs & imaging results that were available during my care of the  patient were reviewed by me and considered in my medical decision making (see chart for details).      Patient with right side pain 1 month. Imaging ordered in triage is negative. Patient has had some nausea and vomiting. She does not have a fever. She is not vomiting now. I have some concern about gallbladder disease in this patient. I recommended to the patient that we check labs and obtain an ultrasound. She declines this, stating that she has been here for 4 hours already, and does not want to wait any longer. She states that she is willing to have her blood drawn, so that she can have the results in her chart and discussed them with her doctor. I have also advised her to schedule an appointment to get a right upper quadrant ultrasound. As she is afebrile, vital signs are stable, and she has not vomiting and not in any apparent distress, doubt that she has acute cholecystitis. Shared decision making was used between myself and the patient, and she elects to go home at this time and follow-up with her doctor. I have given her clearance specific return precautions. She appears stable for discharge and outpatient follow-up.  She is not pregnant. She does have blood in her urinalysis, but is currently on her menstrual cycle.   Final Clinical Impressions(s) / ED Diagnoses   Final diagnoses:  Right sided abdominal pain    New Prescriptions New Prescriptions   No medications on file     Montine Circle, Hershal Coria 04/07/17 0106    Varney Biles, MD 04/10/17 2153

## 2017-04-07 NOTE — Discharge Instructions (Signed)
You need to follow-up with your doctor to have your gallbladder evaluated.  You have chosen to do this on an outpatient basis, rather than wait for the tests to be done in the ER tonight.  You must return if you have fever, persistent vomiting, or worsening symptoms.

## 2017-05-29 ENCOUNTER — Other Ambulatory Visit: Payer: Self-pay | Admitting: Allergy and Immunology

## 2017-05-29 DIAGNOSIS — T457X1A Poisoning by anticoagulant antagonists, vitamin K and other coagulants, accidental (unintentional), initial encounter: Secondary | ICD-10-CM

## 2017-05-29 DIAGNOSIS — D689 Coagulation defect, unspecified: Secondary | ICD-10-CM

## 2017-05-29 DIAGNOSIS — N939 Abnormal uterine and vaginal bleeding, unspecified: Secondary | ICD-10-CM

## 2017-06-11 ENCOUNTER — Encounter: Payer: Self-pay | Admitting: Allergy and Immunology

## 2017-06-11 ENCOUNTER — Ambulatory Visit (INDEPENDENT_AMBULATORY_CARE_PROVIDER_SITE_OTHER): Payer: Medicaid Other | Admitting: Allergy and Immunology

## 2017-06-11 VITALS — BP 120/70 | HR 84 | Resp 16

## 2017-06-11 DIAGNOSIS — Z91038 Other insect allergy status: Secondary | ICD-10-CM

## 2017-06-11 DIAGNOSIS — J4520 Mild intermittent asthma, uncomplicated: Secondary | ICD-10-CM

## 2017-06-11 DIAGNOSIS — Z91018 Allergy to other foods: Secondary | ICD-10-CM

## 2017-06-11 DIAGNOSIS — J3089 Other allergic rhinitis: Secondary | ICD-10-CM

## 2017-06-11 DIAGNOSIS — L509 Urticaria, unspecified: Secondary | ICD-10-CM

## 2017-06-11 DIAGNOSIS — Z91030 Bee allergy status: Secondary | ICD-10-CM

## 2017-06-11 DIAGNOSIS — T782XXD Anaphylactic shock, unspecified, subsequent encounter: Secondary | ICD-10-CM | POA: Diagnosis not present

## 2017-06-11 MED ORDER — RANITIDINE HCL 300 MG PO TABS: 300.0000 mg | ORAL_TABLET | Freq: Every day | ORAL | 5 refills | Status: DC

## 2017-06-11 NOTE — Progress Notes (Signed)
Follow-up Note  Referring Provider: Cher Nakai, MD Primary Provider: Cher Nakai, MD Date of Office Visit: 06/11/2017  Subjective:   Nina Wood (DOB: Apr 03, 1986) is a 31 y.o. female who [facility]2017 in re-evaluation of the following:  HPI: Nina Wood presents to this clinic in reevaluation of her chronic urticaria and history of recurrent anaphylaxis and food allergy directed at shellfish and allergic rhinoconjunctivitis and possible component of asthma and possible Hymenoptera venom hypersensitivity state. I have not seen her in this clinic since April 2017.  She appears to be doing quite well regarding her respiratory tract issue. She is using Nasacort at this point and she believes that this is working well and preventing her been developing recurrent problems with her upper airways and her eyes.  Her asthma has not been active and she rarely uses a short acting bronchodilator. She does not exercise to any large degree. She has been recently diagnosed with fibromyalgia.  She still has hives. During her last evaluation in his clinic we recommended that she undergo therapy with omalizumab for her chronic urticaria and recurrent allergic reactions and unfortunately there was a logistical problem in getting that approval completed and she still continues to have hives almost on a daily basis during spring and fall and even during the summer and the winter she will have outbreaks of hives twice a week. She is using her cetirizine up to 30 mg daily as well as her other medications on a regular basis.  Allergies as of 06/11/2017      Reactions   Fish Allergy Anaphylaxis   Penicillins Hives   Has patient had a PCN reaction causing immediate rash, facial/tongue/throat swelling, SOB or lightheadedness with hypotension: Yes Has patient had a PCN reaction causing severe rash involving mucus membranes or skin necrosis: No Has patient had a PCN reaction that  required hospitalization No Has patient had a PCN reaction occurring within the last 10 years: Yes If all of the above answers are "NO", then may proceed with Cephalosporin use.   Shellfish Allergy Anaphylaxis   Patient is allergic to all seafood.   Other Swelling   Mayonnaise causes swelling of the tongue.   Peanut Butter Flavor Hives   Penicillin G Hives   Histamine Other (See Comments)   Patient  tested positive in allergy testing to Control-histamine 1.   Voltaren [diclofenac] Rash, Other (See Comments)   Volatren gel causes blisters.      Medication List      cetirizine 10 MG tablet Commonly known as:  ZYRTEC Take 20 mg by mouth at bedtime.   CLEAR EYES COMPLETE OP Place 1 drop into both eyes 2 (two) times daily as needed (dry eyes/itching).   diphenoxylate-atropine 2.5-0.025 MG tablet Commonly known as:  LOMOTIL Take 1 tablet by mouth 4 (four) times daily as needed for diarrhea or loose stools.   EPIPEN 2-PAK 0.3 mg/0.3 mL Soaj injection Generic drug:  EPINEPHrine Inject 0.3 mg into the muscle once as needed (severe allergic reactions).   HYDROcodone-acetaminophen 7.5-325 MG tablet Commonly known as:  NORCO Take 0.5-1 tablets by mouth at bedtime as needed for moderate pain.   leuprolide 3.75 MG injection Commonly known as:  LUPRON Inject 3.75 mg into the muscle every 28 (twenty-eight) days.   ondansetron 4 MG tablet Commonly known as:  ZOFRAN Take 1 tablet (4 mg total) by mouth every 6 (six) hours.   oxyCODONE-acetaminophen 5-325 MG tablet Commonly known  as:  PERCOCET/ROXICET Take 1-2 tablets by mouth every 6 (six) hours as needed for severe pain.   PROAIR HFA 108 (90 Base) MCG/ACT inhaler Generic drug:  albuterol Inhale 2 puffs into the lungs every 6 (six) hours as needed for wheezing or shortness of breath.   ranitidine 300 MG tablet Commonly known as:  ZANTAC Take 300 mg by mouth at bedtime.   traMADol 50 MG tablet Commonly known as:  ULTRAM Take 1  tablet (50 mg total) by mouth every 6 (six) hours as needed.   triamcinolone cream 0.5 % Commonly known as:  KENALOG Apply 1 application topically every 4 (four) hours as needed (rash/allergies).       Past Medical History:  Diagnosis Date  . Anemia   . Asthma   . Breast cyst   . Deep vein thrombosis (DVT) ([place])   . Environmental allergies   . Headache   . Hip discomfort   . Ovarian cyst     Past Surgical History:  Procedure Laterality Date  . APPENDECTOMY    . CESAREAN SECTION    . DILATION AND CURETTAGE OF UTERUS     1 WEEK AGO  . DILATION AND EVACUATION Bilateral 05/18/2016   Procedure: DILATATION AND EVACUATION;  Surgeon: Bobbye Charleston, MD;  Location: [facility];  Service: Gynecology;  Laterality: Bilateral;  . ECTOPIC PREGNANCY SURGERY    . etopi    . GASTRIC BYPASS    . INDUCED ABORTION    . OVARIAN CYST SURGERY      Review of systems negative except as noted in HPI / PMHx or noted below:  Review of Systems  Constitutional: Negative.   HENT: Negative.   Eyes: Negative.   Respiratory: Negative.   Cardiovascular: Negative.   Gastrointestinal: Negative.   Genitourinary: Negative.   Musculoskeletal: Negative.   Skin: Negative.   Neurological: Negative.   Endo/Heme/Allergies: Negative.   Psychiatric/Behavioral: Negative.      Objective:   Vitals:   06/11/17 1654  BP: 120/70  Pulse: 84  Resp: 16          Physical Exam  Constitutional: She is well-developed, well-nourished, and in no distress.  HENT:  Head: Normocephalic.  Right Ear: Tympanic membrane, external ear and ear canal normal.  Left Ear: Tympanic membrane, external ear and ear canal normal.  Nose: Nose normal. No mucosal edema or rhinorrhea.  Mouth/Throat: Uvula is midline, oropharynx is clear and moist and mucous membranes are normal. No oropharyngeal exudate.  Eyes: Conjunctivae are normal.  Neck: Trachea normal. No tracheal tenderness present. No tracheal deviation present. No  thyromegaly present.  Cardiovascular: Normal rate, regular rhythm, S1 normal, S2 normal and normal heart sounds.   No murmur heard. Pulmonary/Chest: Breath sounds normal. No stridor. No respiratory distress. She has no wheezes. She has no rales.  Musculoskeletal: She exhibits no edema.  Lymphadenopathy:       Head (right side): No tonsillar adenopathy present.       Head (left side): No tonsillar adenopathy present.    She has no cervical adenopathy.  Neurological: She is alert. Gait normal.  Skin: No rash noted. She is not diaphoretic. No erythema. Nails show no clubbing.  Psychiatric: Mood and affect normal.    Diagnostics: Results of blood tests obtained 03/07/2016 identified a negative hymenoptera IgE panel, and negative shellfish IgE panel, and a serum tryptase level of 4.1 ug/l  Results of blood tests obtained 04/07/2017 identified a hemoglobin of 8.7, platelets 334, white blood cell count 6.1  with a normal differential and an absolute eosinophil count of 100, total protein 5.9, albumin 3.3, normal hepatic and renal function   Spirometry was performed and demonstrated an FEV1 of 1.87 at 95 % of predicted.  The patient had an Asthma Control Test with the following results: ACT Total Score: 20.    Assessment and Plan:   1. Urticaria   2. Anaphylaxis, subsequent encounter   3. Hymenoptera allergy   4. Asthma, mild intermittent, well-controlled   5. Food allergy   6. Other allergic rhinitis     1. Submit for Xolair again?  2. Continue EpiPen, Benadryl, M.D./ER for allergic reaction  3. Continue combination: ranitidine 300 mg, cetirizine 10 mg daily  4. Can increase cetirizine to 20-30 mg daily  5. Continue ProAir HFA 2 puffs every 4-6 hours if needed  6. Continue OTC Rhinocort or Nasacort one spray each nostril once a day  7. Return to clinic in 6 months or earlier if problem  8. Obtain fall flu vaccine  Nina Wood would benefit from using omalizumab to calm down her  overactive immune system and I once again made that suggestion today. She will continue to use an H1 and H2 receptor blocker as noted above at this point in time and for her upper airway inflammatory condition she can use a nasal steroid which has been working relatively well. There is not much evidence that she has a significant Hymenoptera allergy or a food allergy directed against shellfish at this point. It would be productive to have her undergo evaluation of venom clinic but she cannot tolerate coming off her antihistamine. She is a candidate for an in clinic food challenge with shellfish should she desire to undergo that form of evaluation. If she does well I will see her back in this clinic in 6 months or earlier if there is a problem.  Allena Katz, MD Allergy / Immunology [place]

## 2017-06-11 NOTE — Patient Instructions (Addendum)
  1. Submit for Xolair again?  2. Continue EpiPen, Benadryl, M.D./ER for allergic reaction  3. Continue combination: ranitidine 300 mg, cetirizine 10 mg daily  4. Can increase cetirizine to 20-30 mg daily  5. Continue ProAir HFA 2 puffs every 4-6 hours if needed  6. Continue OTC Rhinocort or Nasacort one spray each nostril once a day  7. Return to clinic in 6 months or earlier if problem  8. Obtain fall flu vaccine

## 2017-06-28 ENCOUNTER — Encounter: Payer: Self-pay | Admitting: *Deleted

## 2017-07-05 ENCOUNTER — Telehealth: Payer: Self-pay | Admitting: *Deleted

## 2017-07-05 NOTE — Telephone Encounter (Signed)
Just FYI I have tried 2 times previously to get patient on Xolair for her CIU and she would never follow through with contacting pharmacy.  I did advise her I would try to get her on Xolair this last time and she would have to answer her phone or callback specialty pharmacy and she advised she would.  I submitted her to [facility] and they have tried to contact her numerous times and mailed a letter.  I have tried to call patient on 8/8, 8/16, and 8/22 and mailed letter on 8/16 with still no response so I will assume she is not interested in starting Xolair and cancel prescription.

## 2017-07-06 ENCOUNTER — Inpatient Hospital Stay (HOSPITAL_COMMUNITY): Payer: Medicaid Other

## 2017-07-06 ENCOUNTER — Inpatient Hospital Stay (HOSPITAL_COMMUNITY)
Admission: AD | Admit: 2017-07-06 | Discharge: 2017-07-06 | Disposition: A | Discharge status: Home / Self Care | Payer: Medicaid Other | Source: Ambulatory Visit | Attending: Obstetrics and Gynecology | Admitting: Obstetrics and Gynecology

## 2017-07-06 ENCOUNTER — Encounter (HOSPITAL_COMMUNITY): Payer: Self-pay | Admitting: *Deleted

## 2017-07-06 DIAGNOSIS — R102 Pelvic and perineal pain: Secondary | ICD-10-CM | POA: Diagnosis not present

## 2017-07-06 DIAGNOSIS — O208 Other hemorrhage in early pregnancy: Secondary | ICD-10-CM | POA: Insufficient documentation

## 2017-07-06 DIAGNOSIS — Z86718 Personal history of other venous thrombosis and embolism: Secondary | ICD-10-CM | POA: Insufficient documentation

## 2017-07-06 DIAGNOSIS — O99511 Diseases of the respiratory system complicating pregnancy, first trimester: Secondary | ICD-10-CM | POA: Diagnosis not present

## 2017-07-06 DIAGNOSIS — O99842 Bariatric surgery status complicating pregnancy, second trimester: Secondary | ICD-10-CM | POA: Insufficient documentation

## 2017-07-06 DIAGNOSIS — Z3A09 9 weeks gestation of pregnancy: Secondary | ICD-10-CM | POA: Insufficient documentation

## 2017-07-06 DIAGNOSIS — O209 Hemorrhage in early pregnancy, unspecified: Secondary | ICD-10-CM | POA: Diagnosis not present

## 2017-07-06 DIAGNOSIS — O26891 Other specified pregnancy related conditions, first trimester: Secondary | ICD-10-CM | POA: Diagnosis not present

## 2017-07-06 DIAGNOSIS — J45909 Unspecified asthma, uncomplicated: Secondary | ICD-10-CM | POA: Insufficient documentation

## 2017-07-06 HISTORY — DX: Fibromyalgia: M79.7

## 2017-07-06 HISTORY — DX: Endometriosis of the uterus, unspecified: N80.00

## 2017-07-06 HISTORY — DX: Endometriosis of uterus: N80.0

## 2017-07-06 LAB — URINALYSIS, ROUTINE W REFLEX MICROSCOPIC
Bilirubin Urine: NEGATIVE
Glucose, UA: NEGATIVE mg/dL
Hgb urine dipstick: NEGATIVE
Ketones, ur: NEGATIVE mg/dL
Leukocytes, UA: NEGATIVE
Nitrite: NEGATIVE
Protein, ur: NEGATIVE mg/dL
Specific Gravity, Urine: 1.014 (ref 1.005–1.030)
pH: 5 (ref 5.0–8.0)

## 2017-07-06 LAB — POCT PREGNANCY, URINE: Preg Test, Ur: POSITIVE — AB

## 2017-07-06 NOTE — MAU Note (Signed)
Pt presents to MAU with complaints of vaginal bleeding on and off for 2 weeks with large clots starting 2 days ago. Lower abdominal cramping.

## 2017-07-06 NOTE — MAU Provider Note (Signed)
Chief Complaint: Vaginal Bleeding   First Provider Initiated Contact with Patient 07/06/17 2024        SUBJECTIVE HPI: Nina Wood is a 31 y.o. [id number] at Unknown by LMP who presents to maternity admissions reporting vaginal bleeding with clots.  Also has pelvic pain with cramping. . She denies vaginal itching/burning, urinary symptoms, h/a, dizziness, n/v, or fever/chills.    Gets care in [facility] but did not want to go there because she works there.  Had an [place] in office last week with good FHR.  Worried due to multiple losses.    Vaginal Bleeding  The patient's primary symptoms include pelvic pain and vaginal bleeding. The patient's pertinent negatives include no genital itching, genital lesions or genital odor. This is a recurrent problem. The current episode started 1 to 4 weeks ago. The problem occurs intermittently. The pain is moderate. She is pregnant. Associated symptoms include abdominal pain. Pertinent negatives include no chills, constipation, diarrhea, fever, nausea or vomiting. The vaginal discharge was bloody. The vaginal bleeding is heavier than menses. She has been passing clots. Nothing aggravates the symptoms. She has tried nothing for the symptoms.   RN Note: Pt presents to MAU with complaints of vaginal bleeding on and off for 2 weeks with large clots starting 2 days ago. Lower abdominal cramping.  Past Medical History:  Diagnosis Date  . Anemia   . Asthma   . Breast cyst   . Deep vein thrombosis (DVT) ([place])   . Environmental allergies   . Headache   . Hip discomfort   . Ovarian cyst    Past Surgical History:  Procedure Laterality Date  . APPENDECTOMY    . CESAREAN SECTION    . DILATION AND CURETTAGE OF UTERUS     1 WEEK AGO  . DILATION AND EVACUATION Bilateral 05/18/2016   Procedure: DILATATION AND EVACUATION;  Surgeon: Bobbye Charleston, MD;  Location: [facility];  Service: Gynecology;  Laterality: Bilateral;  . ECTOPIC PREGNANCY SURGERY    . etopi    .  GASTRIC BYPASS    . INDUCED ABORTION    . OVARIAN CYST SURGERY     Social History   Social History  . Marital status: Single    Spouse name: N/A  . Number of children: N/A  . Years of education: N/A   Occupational History  . Not on file.   Social History Main Topics  . Smoking status: Never Smoker  . Smokeless tobacco: Never Used  . Alcohol use 0.6 oz/week    1 Glasses of wine per week     Comment: OCCASSIONAL  . Drug use: No  . Sexual activity: Yes    Birth control/ protection: None   Other Topics Concern  . Not on file   Social History Narrative  . No narrative on file   No current facility-administered medications on file prior to encounter.    Current Outpatient Prescriptions on File Prior to Encounter  Medication Sig Dispense Refill  . albuterol (PROAIR HFA) 108 (90 Base) MCG/ACT inhaler Inhale 2 puffs into the lungs every 6 (six) hours as needed for wheezing or shortness of breath.    . cetirizine (ZYRTEC) 10 MG tablet Take 20 mg by mouth at bedtime.     . diphenoxylate-atropine (LOMOTIL) 2.5-0.025 MG tablet Take 1 tablet by mouth 4 (four) times daily as needed for diarrhea or loose stools. 30 tablet 0  . EPINEPHrine (EPIPEN 2-PAK) 0.3 mg/0.3 mL IJ SOAJ injection Inject 0.3 mg into  the muscle once as needed (severe allergic reactions).    Marland Kitchen HYDROcodone-acetaminophen (NORCO) 7.5-325 MG tablet Take 0.5-1 tablets by mouth at bedtime as needed for moderate pain.    . Hyprom-Naphaz-Polysorb-Zn Sulf (CLEAR EYES COMPLETE OP) Place 1 drop into both eyes 2 (two) times daily as needed (dry eyes/itching).     Marland Kitchen leuprolide (LUPRON) 3.75 MG injection Inject 3.75 mg into the muscle every 28 (twenty-eight) days.    . ondansetron (ZOFRAN) 4 MG tablet Take 1 tablet (4 mg total) by mouth every 6 (six) hours. 12 tablet 0  . oxyCODONE-acetaminophen (PERCOCET/ROXICET) 5-325 MG tablet Take 1-2 tablets by mouth every 6 (six) hours as needed for severe pain. 6 tablet 0  . ranitidine  (ZANTAC) 300 MG tablet Take 1 tablet (300 mg total) by mouth at bedtime. 30 tablet 5  . traMADol (ULTRAM) 50 MG tablet Take 1 tablet (50 mg total) by mouth every 6 (six) hours as needed. 10 tablet 0  . triamcinolone cream (KENALOG) 0.5 % Apply 1 application topically every 4 (four) hours as needed (rash/allergies).      Allergies  Allergen Reactions  . Fish Allergy Anaphylaxis  . Penicillins Hives    Has patient had a PCN reaction causing immediate rash, facial/tongue/throat swelling, SOB or lightheadedness with hypotension: Yes Has patient had a PCN reaction causing severe rash involving mucus membranes or skin necrosis: No Has patient had a PCN reaction that required hospitalization No Has patient had a PCN reaction occurring within the last 10 years: Yes If all of the above answers are "NO", then may proceed with Cephalosporin use.    . Shellfish Allergy Anaphylaxis    Patient is allergic to all seafood.  . Other Swelling    Mayonnaise causes swelling of the tongue.  Marland Kitchen Peanut Butter Flavor Hives  . Penicillin G Hives  . Histamine Other (See Comments)    Patient  tested positive in allergy testing to Control-histamine 1.  . Voltaren [Diclofenac] Rash and Other (See Comments)    Volatren gel causes blisters.    I have reviewed patient's Past Medical Hx, Surgical Hx, Family Hx, Social Hx, medications and allergies.   ROS:  Review of Systems  Constitutional: Negative for chills and fever.  Respiratory: Negative for shortness of breath.   Gastrointestinal: Positive for abdominal pain. Negative for constipation, diarrhea, nausea and vomiting.  Genitourinary: Positive for pelvic pain and vaginal bleeding.   Review of Systems  Other systems negative   Physical Exam  Physical Exam Patient Vitals for the past 24 hrs:  BP Temp Pulse Resp Weight  07/06/17 1850 124/72 98.5 F (36.9 C) 87 16 159 lb (72.1 kg)   Constitutional: Well-developed, well-nourished female in no acute  distress.  Cardiovascular: normal rate Respiratory: normal effort GI: Abd soft, non-tender. Pos BS x 4 MS: Extremities nontender, no edema, normal ROM Neurologic: Alert and oriented x 4.  GU: Neg CVAT.  PELVIC EXAM: Cervix pink, visually closed, without lesion, scant white creamy discharge, vaginal walls and external genitalia normal    NO BLOOD SEEN IN VAGINA AT ALL Bimanual exam: Cervix 0/long/high, firm, anterior, neg CMT, uterus nontender, nonenlarged, adnexa without tenderness, enlargement, or mass  Unable to see fetus well with bedside US  LAB RESULTS Results for orders placed or performed during the hospital encounter of 07/06/17 (from the past 24 hour(s))  Urinalysis, Routine w reflex microscopic     Status: Abnormal   Collection Time: 07/06/17  6:54 PM  Result Value Ref Range  Color, Urine YELLOW YELLOW   APPearance HAZY (A) CLEAR   Specific Gravity, Urine 1.014 1.005 - 1.030   pH 5.0 5.0 - 8.0   Glucose, UA NEGATIVE NEGATIVE mg/dL   Hgb urine dipstick NEGATIVE NEGATIVE   Bilirubin Urine NEGATIVE NEGATIVE   Ketones, ur NEGATIVE NEGATIVE mg/dL   Protein, ur NEGATIVE NEGATIVE mg/dL   Nitrite NEGATIVE NEGATIVE   Leukocytes, UA NEGATIVE NEGATIVE  Pregnancy, urine POC     Status: Abnormal   Collection Time: 07/06/17  6:56 PM  Result Value Ref Range   Preg Test, Ur POSITIVE (A) NEGATIVE       IMAGING US Ob Comp Less 14 Wks  Result Date: 07/06/2017 CLINICAL DATA:  Vaginal bleeding and pelvic cramping at 9 weeks and 4 days of gestation by last menstrual period. Positive urine pregnancy test. EXAM: OBSTETRIC <14 WK ULTRASOUND TECHNIQUE: Transabdominal ultrasound was performed for evaluation of the gestation as well as the maternal uterus and adnexal regions. COMPARISON:  05/17/2016. FINDINGS: Intrauterine gestational sac: Visualized Yolk sac:  Visualized Embryo:  Visualized Cardiac Activity: Visualized Heart Rate: 167 bpm CRL:   29.8  mm   9 w 5 d                  [place] EDC:  02/03/2018 Subchorionic hemorrhage:  Small subchorionic hemorrhage. Maternal uterus/adnexae: Right ovarian corpus luteum cyst. Normal appearing left ovary. No free peritoneal fluid. IMPRESSION: 1. Single live intrauterine gestation with an estimated gestational age of [redacted] weeks and 5 days. 2. Small subchorionic hemorrhage. Electronically Signed   By: Claudie Revering M.D.   On: 07/06/2017 21:45    MAU Management/MDM: Sent to [place] as I could not easily see fetus US showed viable SIUP with small [facility] DIscussed with patient who is still worried Reviewed precautions.   ASSESSMENT Single IUP at 9w5 d Small subchorionic hemorrhage Hx multiple losses  PLAN Discharge home Pelvic rest Bleeding precautions   Pt stable at time of discharge. Encouraged to return here or to other Urgent Care/ED if she develops worsening of symptoms, increase in pain, fever, or other concerning symptoms.    Hansel Feinstein CNM, MSN Certified Nurse-Midwife 07/06/2017  8:24 PM

## 2017-07-06 NOTE — Discharge Instructions (Signed)
Vaginal Bleeding During Pregnancy, First Trimester °A small amount of bleeding (spotting) from the vagina is common in early pregnancy. Sometimes the bleeding is normal and is not a problem, and sometimes it is a sign of something serious. Be sure to tell your doctor about any bleeding from your vagina right away. °Follow these instructions at home: °· Watch your condition for any changes. °· Follow your doctor's instructions about how active you can be. °· If you are on bed rest: °? You may need to stay in bed and only get up to use the bathroom. °? You may be allowed to do some activities. °? If you need help, make plans for someone to help you. °· Write down: °? The number of pads you use each day. °? How often you change pads. °? How soaked (saturated) your pads are. °· Do not use tampons. °· Do not douche. °· Do not have sex or orgasms until your doctor says it is okay. °· If you pass any tissue from your vagina, save the tissue so you can show it to your doctor. °· Only take medicines as told by your doctor. °· Do not take aspirin because it can make you bleed. °· Keep all follow-up visits as told by your doctor. °Contact a doctor if: °· You bleed from your vagina. °· You have cramps. °· You have labor pains. °· You have a fever that does not go away after you take medicine. °Get help right away if: °· You have very bad cramps in your back or belly (abdomen). °· You pass large clots or tissue from your vagina. °· You bleed more. °· You feel light-headed or weak. °· You pass out (faint). °· You have chills. °· You are leaking fluid or have a gush of fluid from your vagina. °· You pass out while pooping (having a bowel movement). °This information is not intended to replace advice given to you by your health care provider. Make sure you discuss any questions you have with your health care provider. °Document Released: 03/16/2014 Document Revised: 04/06/2016 Document Reviewed: 07/07/2013 °[employer]vier Interactive  Patient Education © 2018 [employer] ° °

## 2017-12-10 ENCOUNTER — Ambulatory Visit (INDEPENDENT_AMBULATORY_CARE_PROVIDER_SITE_OTHER): Payer: Medicaid Other | Admitting: Allergy and Immunology

## 2017-12-10 ENCOUNTER — Encounter: Payer: Self-pay | Admitting: Allergy and Immunology

## 2017-12-10 VITALS — BP 138/82 | HR 80 | Resp 18

## 2017-12-10 DIAGNOSIS — J3089 Other allergic rhinitis: Secondary | ICD-10-CM | POA: Diagnosis not present

## 2017-12-10 DIAGNOSIS — Z91038 Other insect allergy status: Secondary | ICD-10-CM

## 2017-12-10 DIAGNOSIS — Z91018 Allergy to other foods: Secondary | ICD-10-CM

## 2017-12-10 DIAGNOSIS — L509 Urticaria, unspecified: Secondary | ICD-10-CM

## 2017-12-10 DIAGNOSIS — J4520 Mild intermittent asthma, uncomplicated: Secondary | ICD-10-CM | POA: Diagnosis not present

## 2017-12-10 DIAGNOSIS — Z91030 Bee allergy status: Secondary | ICD-10-CM

## 2017-12-10 NOTE — Progress Notes (Signed)
Follow-up Note  Referring Provider: Cher Nakai, MD Primary Provider: Cher Nakai, MD Date of Office Visit: 12/10/2017  Subjective:   Nina Wood (DOB: Jan 05, 1986) is a 32 y.o. female who returns to the [facility] [place] on 12/10/2017 in re-evaluation of the following:  HPI: Nina Wood returns to this clinic in evaluation of her chronic urticaria and history of food allergy directed against shellfish and history of allergic rhinitis and mild intermittent asthma and history of possible hymenoptera venom hypersensitivity state.  Her last visit to this clinic was 11 June 2017.  Overall she has done relatively well regarding her urticaria as long as she remains on cetirizine 20 mg daily and an H2 receptor blocker and a leukotriene modifier..  She did need to use an EpiPen about 2 months ago for the development of a reaction following a sting on her hip.  She became acutely short of breath and a flare of her urticaria and she responded appropriately to Benadryl and epinephrine.  Otherwise she has done very well and has not required a systemic steroid to treat any type of immunological hyperreactivity.    Her nose has really been doing quite well while using Flonase and her asthma has been nonexistent and she rarely a SABA.  She remains away from eating shellfish.  In fact she has generalized this restriction to all seafood.  She has not received the flu vaccine yet.  She has an appointment to see her primary care doctor tomorrow.  Allergies as of 12/10/2017      Reactions   Fish Allergy Anaphylaxis   Other Swelling   Mayonnaise causes swelling of the tongue.   Penicillins Hives   Has patient had a PCN reaction causing immediate rash, facial/tongue/throat swelling, SOB or lightheadedness with hypotension: Yes Has patient had a PCN reaction causing severe rash involving mucus membranes or skin necrosis: No Has patient had a PCN reaction that required hospitalization No Has patient  had a PCN reaction occurring within the last 10 years: Yes If all of the above answers are "NO", then may proceed with Cephalosporin use.   Shellfish Allergy Anaphylaxis   Patient is allergic to all seafood.   Peanut Butter Flavor Hives   Histamine Other (See Comments)   Patient  tested positive in allergy testing to Control-histamine 1.   Voltaren [diclofenac] Rash, Other (See Comments)   Volatren gel causes blisters.      Medication List      cetirizine 10 MG tablet Commonly known as:  ZYRTEC Take 20 mg by mouth at bedtime.   CLEAR EYES COMPLETE OP Place 1 drop into both eyes 2 (two) times daily as needed (dry eyes/itching).   enoxaparin 40 MG/0.4ML injection Commonly known as:  LOVENOX Inject 40 mg into the skin daily.   EPIPEN 2-PAK 0.3 mg/0.3 mL Soaj injection Generic drug:  EPINEPHrine Inject 0.3 mg into the muscle once as needed (severe allergic reactions).   ferrous sulfate 325 (65 FE) MG EC tablet Take 325 mg by mouth daily.   gabapentin 600 MG tablet Commonly known as:  NEURONTIN Take 600 mg by mouth at bedtime.   HYDROcodone-acetaminophen 7.5-325 MG tablet Commonly known as:  NORCO Take 0.5-1 tablets by mouth every 6 (six) hours as needed for moderate pain.   lidocaine 5 % Commonly known as:  LIDODERM Place 1 patch onto the skin daily as needed (pain).   montelukast 10 MG tablet Commonly known as:  SINGULAIR Take 10 mg by mouth  at bedtime.   ondansetron 4 MG tablet Commonly known as:  ZOFRAN Take 1 tablet (4 mg total) by mouth every 6 (six) hours.   PROAIR HFA 108 (90 Base) MCG/ACT inhaler Generic drug:  albuterol Inhale 2 puffs into the lungs every 6 (six) hours as needed for wheezing or shortness of breath.   ranitidine 300 MG tablet Commonly known as:  ZANTAC Take 1 tablet (300 mg total) by mouth at bedtime.   traMADol 50 MG tablet Commonly known as:  ULTRAM Take 1 tablet (50 mg total) by mouth every 6 (six) hours as needed.     triamcinolone cream 0.5 % Commonly known as:  KENALOG Apply 1 application topically every 4 (four) hours as needed (rash/allergies).       Past Medical History:  Diagnosis Date  . Anemia   . Asthma   . Breast cyst   . Deep vein thrombosis (DVT) ([place])   . Endometriosis, uterus   . Environmental allergies   . Fibromyalgia syndrome   . Headache   . Hip discomfort   . Ovarian cyst     Past Surgical History:  Procedure Laterality Date  . APPENDECTOMY    . CESAREAN SECTION    . DILATION AND CURETTAGE OF UTERUS     1 WEEK AGO  . DILATION AND EVACUATION Bilateral 05/18/2016   Procedure: DILATATION AND EVACUATION;  Surgeon: Bobbye Charleston, MD;  Location: [facility];  Service: Gynecology;  Laterality: Bilateral;  . ECTOPIC PREGNANCY SURGERY    . etopi    . GASTRIC BYPASS    . INDUCED ABORTION    . OVARIAN CYST SURGERY      Review of systems negative except as noted in HPI / PMHx or noted below:  Review of Systems  Constitutional: Negative.   HENT: Negative.   Eyes: Negative.   Respiratory: Negative.   Cardiovascular: Negative.   Gastrointestinal: Negative.   Genitourinary: Negative.   Musculoskeletal: Negative.   Skin: Negative.   Neurological: Negative.   Endo/Heme/Allergies: Negative.   Psychiatric/Behavioral: Negative.      Objective:   Vitals:   12/10/17 1614  BP: 138/82  Pulse: 80  Resp: 18          Physical Exam  Constitutional: She is well-developed, well-nourished, and in no distress.  HENT:  Head: Normocephalic.  Right Ear: Tympanic membrane, external ear and ear canal normal.  Left Ear: Tympanic membrane, external ear and ear canal normal.  Nose: Nose normal. No mucosal edema or rhinorrhea.  Mouth/Throat: Uvula is midline, oropharynx is clear and moist and mucous membranes are normal. No oropharyngeal exudate.  Eyes: Conjunctivae are normal.  Neck: Trachea normal. No tracheal tenderness present. No tracheal deviation present. No thyromegaly  present.  Cardiovascular: Normal rate, regular rhythm, S1 normal, S2 normal and normal heart sounds.  No murmur heard. Pulmonary/Chest: Breath sounds normal. No stridor. No respiratory distress. She has no wheezes. She has no rales.  Musculoskeletal: She exhibits no edema.  Lymphadenopathy:       Head (right side): No tonsillar adenopathy present.       Head (left side): No tonsillar adenopathy present.    She has no cervical adenopathy.  Neurological: She is alert. Gait normal.  Skin: No rash noted. She is not diaphoretic. No erythema. Nails show no clubbing.  Psychiatric: Mood and affect normal.    Diagnostics:    Spirometry was performed and demonstrated an FEV1 of 1.97 at 102 % of predicted.  The patient had an Asthma  Control Test with the following results: ACT Total Score: 22.    Assessment and Plan:   1. Urticaria   2. Other allergic rhinitis   3. Asthma, mild intermittent, well-controlled   4. Food allergy   5. Hymenoptera allergy     1.  Follow-up in venom clinic without use of antihistamines  2. Continue EpiPen, Benadryl, M.D./ER for allergic reaction  3. Continue combination: ranitidine 300 mg + cetirizine 20 mg + montelukast 10 mg daily  4. Can increase cetirizine to 30 mg daily if needed  5. Continue ProAir HFA 2 puffs every 4-6 hours if needed  6. Continue Flonase one spray each nostril once a day  7. Return to clinic in 12 months or earlier if problem  8. Obtain flu vaccine  [name] appears to be doing relatively well with her urticaria to her upper airway atopic disease.  I think that she should have further evaluation for her venom allergy and although her previous blood test did not identify any venom specific IgE antibodies an evaluation in venom clinic will determine whether or not she is a candidate for immunotherapy directed against venom.  Of course, she would need to perform this analysis without the use of antihistamines which may be somewhat  difficult for her.  If she does well on her current plan I will see her back in this clinic in 12 months or earlier if there is a problem.  Allena Katz, MD [facility] [place]

## 2017-12-10 NOTE — Patient Instructions (Addendum)
  1.  Follow-up in venom clinic without use of antihistamines  2. Continue EpiPen, Benadryl, M.D./ER for allergic reaction  3. Continue combination: ranitidine 300 mg + cetirizine 20 mg + montelukast 10 mg daily  4. Can increase cetirizine to 30 mg daily if needed  5. Continue ProAir HFA 2 puffs every 4-6 hours if needed  6. Continue Flonase one spray each nostril once a day  7. Return to clinic in 12 months or earlier if problem  8. Obtain flu vaccine

## 2017-12-11 ENCOUNTER — Encounter: Payer: Self-pay | Admitting: Allergy and Immunology

## 2017-12-11 ENCOUNTER — Telehealth: Payer: Self-pay

## 2017-12-11 NOTE — Telephone Encounter (Signed)
Kozlow, Donnamarie Poag, MD  [facility]        Please inform patient that her blood tests for venom were negative so she will need venom clinic evaluation WITHOUT antihistamine use at some point in the future.    Pt. Advised. Will set up appt for venom testing.

## 2018-02-21 DIAGNOSIS — F411 Generalized anxiety disorder: Secondary | ICD-10-CM | POA: Diagnosis not present

## 2018-02-26 DIAGNOSIS — M25511 Pain in right shoulder: Secondary | ICD-10-CM | POA: Diagnosis not present

## 2018-02-26 DIAGNOSIS — M6281 Muscle weakness (generalized): Secondary | ICD-10-CM | POA: Diagnosis not present

## 2018-02-27 ENCOUNTER — Telehealth: Payer: Self-pay | Admitting: Allergy and Immunology

## 2018-02-27 DIAGNOSIS — F411 Generalized anxiety disorder: Secondary | ICD-10-CM | POA: Diagnosis not present

## 2018-02-27 NOTE — Telephone Encounter (Signed)
Nina Wood called in and would like refills for EPI-PEN and SINGULAIR sent to [facility] and she would also like to know if she still has to keep taking ZANTAC?  She said she took it last night but it doesn't do anything for her and she wants to know if she can stop taking it.

## 2018-02-28 ENCOUNTER — Other Ambulatory Visit: Payer: Self-pay | Admitting: *Deleted

## 2018-02-28 MED ORDER — MONTELUKAST SODIUM 10 MG PO TABS: ORAL_TABLET | ORAL | 8 refills | Status: DC

## 2018-02-28 MED ORDER — EPINEPHRINE 0.3 MG/0.3ML IJ SOAJ: INTRAMUSCULAR | 3 refills | Status: DC

## 2018-02-28 NOTE — Telephone Encounter (Signed)
I told Nina Wood that the Zantac should be taken per Dr. Neldon Mc to help prevent hives. She will continue taking.

## 2018-02-28 NOTE — Telephone Encounter (Signed)
Left message for patient to call office. Dr. Bruna Potter note says to take the Zantac continuously to prevent breakouts. Is she still having episodes?  Epipen and Singulair sent to [facility].

## 2018-03-01 DIAGNOSIS — F411 Generalized anxiety disorder: Secondary | ICD-10-CM | POA: Diagnosis not present

## 2018-03-02 DIAGNOSIS — F411 Generalized anxiety disorder: Secondary | ICD-10-CM | POA: Diagnosis not present

## 2018-03-07 DIAGNOSIS — M25511 Pain in right shoulder: Secondary | ICD-10-CM | POA: Diagnosis not present

## 2018-03-07 DIAGNOSIS — F411 Generalized anxiety disorder: Secondary | ICD-10-CM | POA: Diagnosis not present

## 2018-03-07 DIAGNOSIS — M6281 Muscle weakness (generalized): Secondary | ICD-10-CM | POA: Diagnosis not present

## 2018-03-08 DIAGNOSIS — I82409 Acute embolism and thrombosis of unspecified deep veins of unspecified lower extremity: Secondary | ICD-10-CM | POA: Diagnosis not present

## 2018-03-08 DIAGNOSIS — K219 Gastro-esophageal reflux disease without esophagitis: Secondary | ICD-10-CM | POA: Diagnosis not present

## 2018-03-08 DIAGNOSIS — M25562 Pain in left knee: Secondary | ICD-10-CM | POA: Diagnosis not present

## 2018-03-08 DIAGNOSIS — M159 Polyosteoarthritis, unspecified: Secondary | ICD-10-CM | POA: Diagnosis not present

## 2018-03-15 IMAGING — US US OB TRANSVAGINAL
1 series · 15 of 28 positions shown · non-contrast
Comparison: Obstetric ultrasound 2 weeks prior 03/28/2016

CLINICAL DATA: Pregnant patient in first-trimester pregnancy with
vaginal bleeding.

EXAM:
TRANSVAGINAL OB ULTRASOUND
TECHNIQUE: Transvaginal ultrasound was performed for complete evaluation of the
gestation as well as the maternal uterus, adnexal regions, and
pelvic cul-de-sac.

[Series 1: us ob transvaginal · 30 acquisitions, 15 frames shown]
[im 1/30]
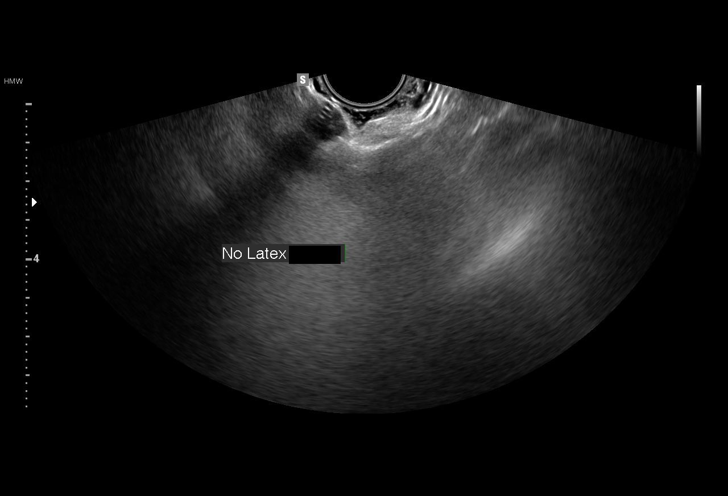
[im 3/30]
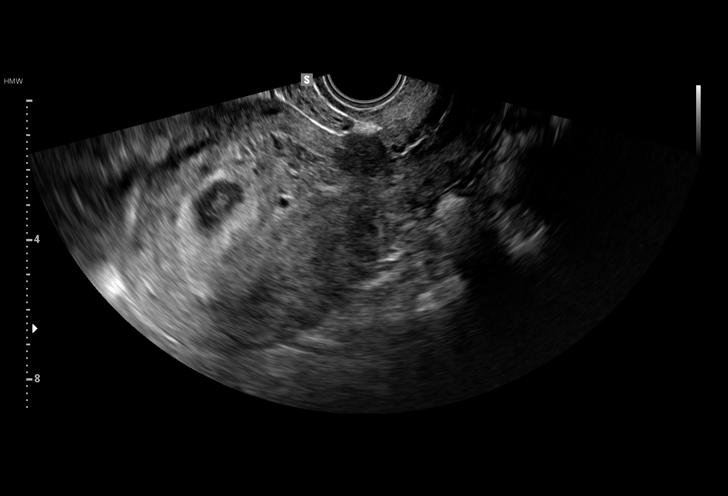
[im 5/30]
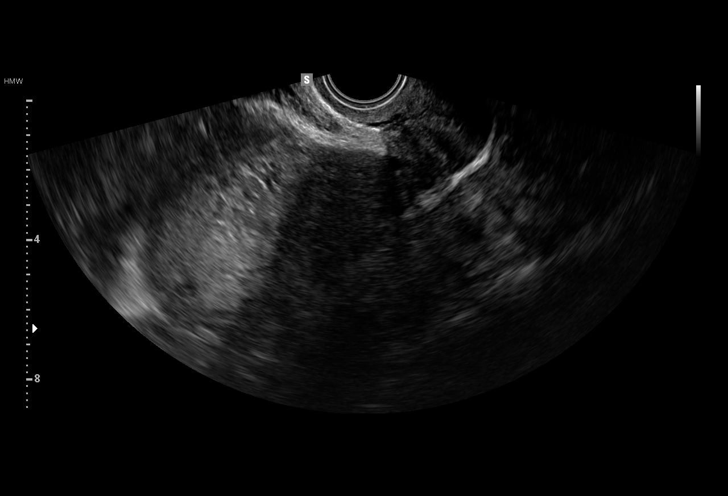
[im 7/30]
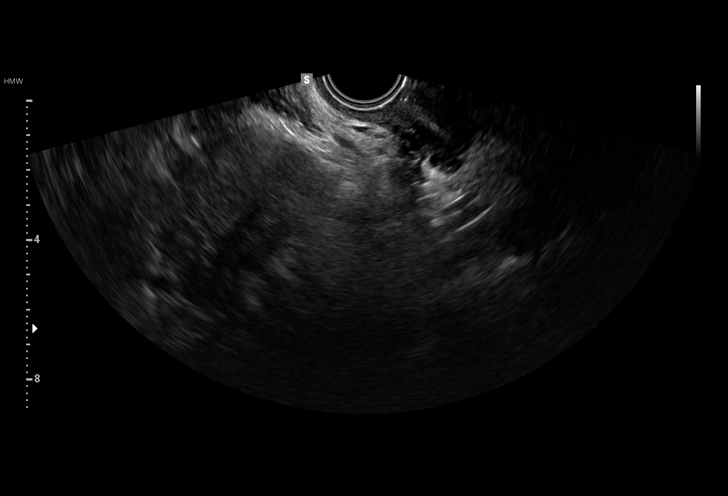
[im 9/30]
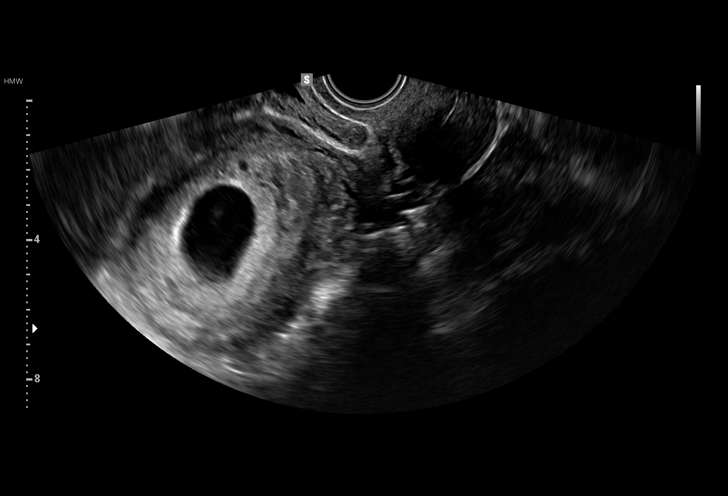
[im 11/30]
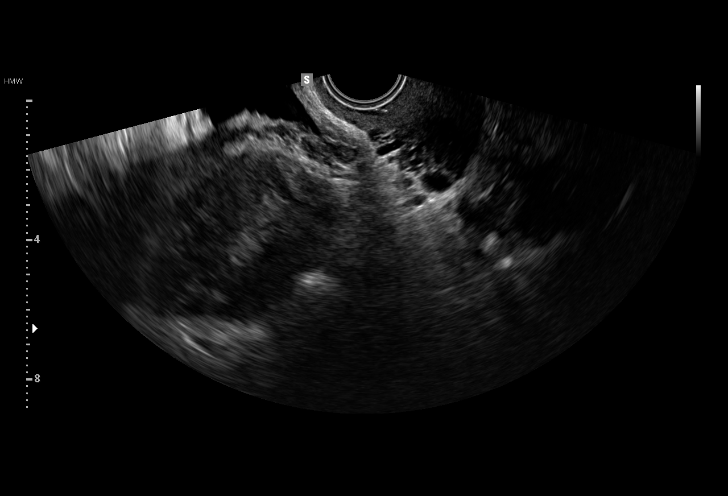
[im 13/30]
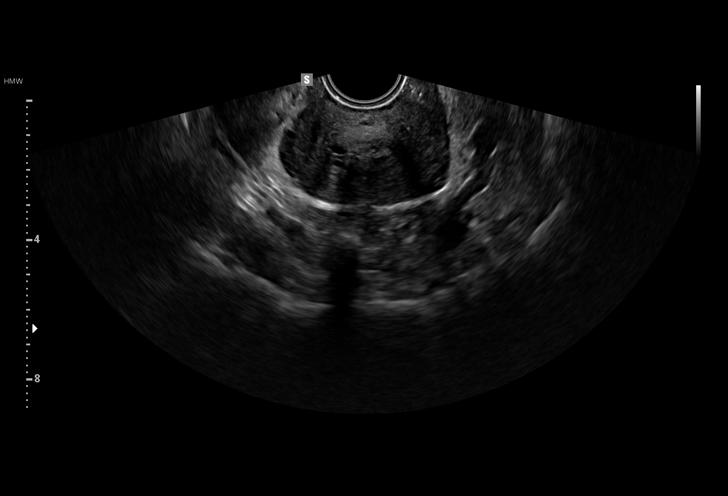
[im 16/30]
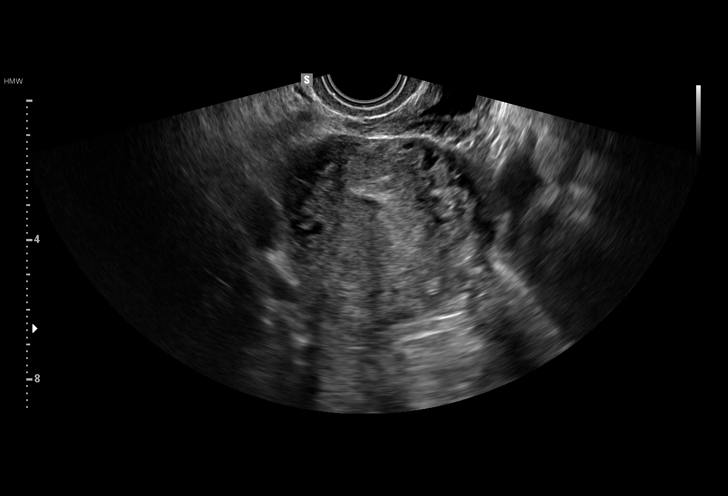
[im 17/30]
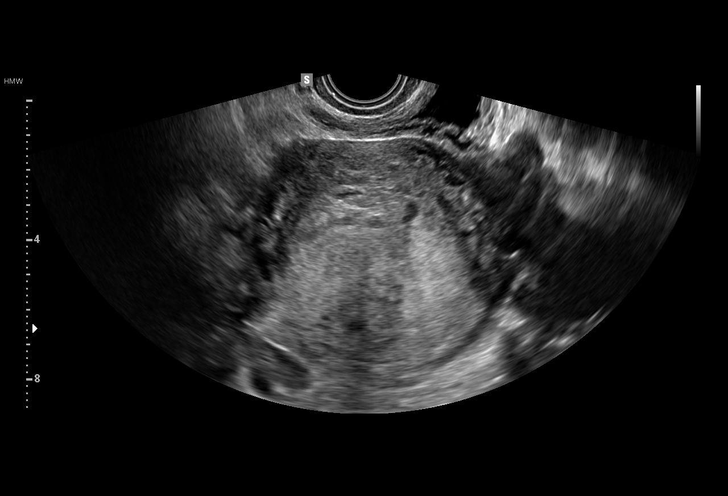
[im 19/30]
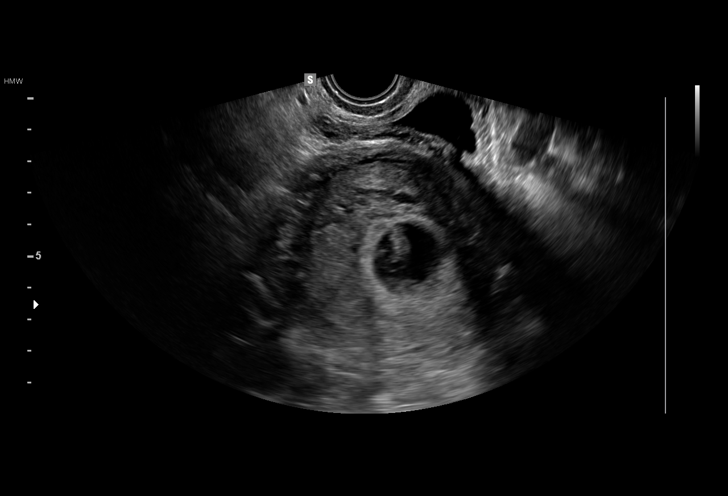
[im 21/30]
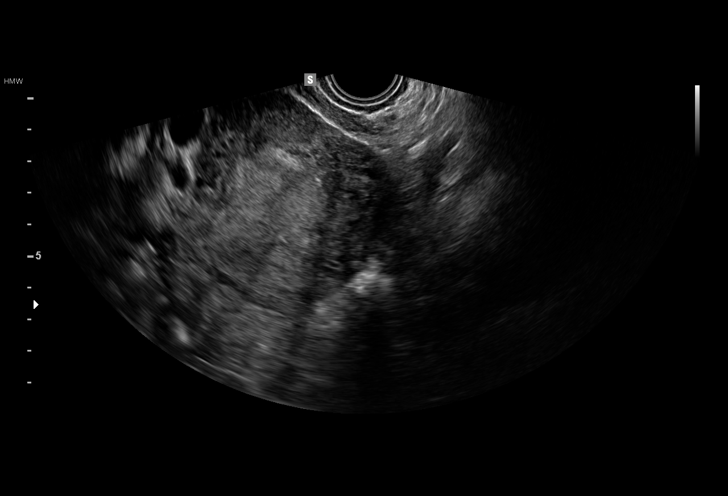
[im 23/30]
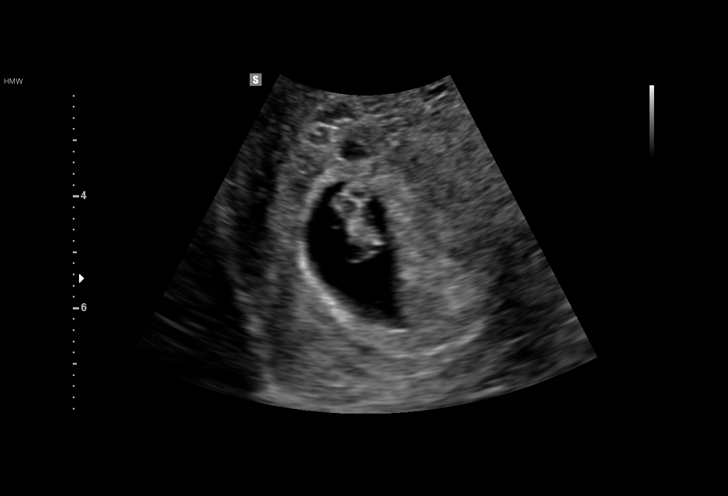
[im 25/30]
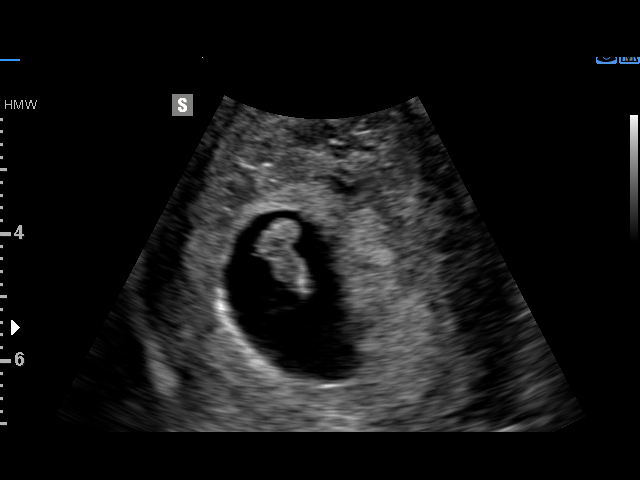
[im 27/30]
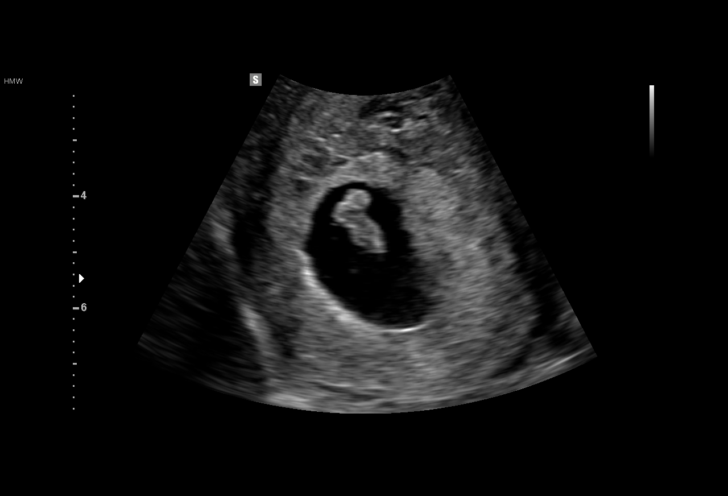
[im 30/30]
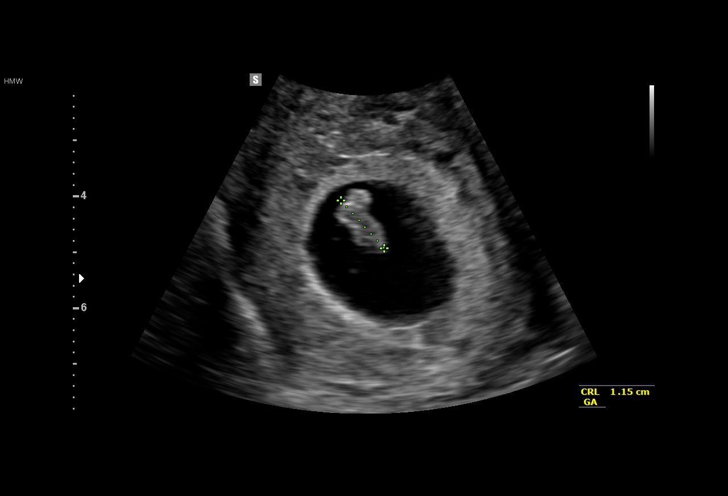

[15 of 28 positions shown; findings below may reference images not displayed]

FINDINGS: Intrauterine gestational sac: Present.

Yolk sac:  Present.

Embryo:  Present.

Cardiac Activity: Present.

Heart Rate: 148 bpm

CRL:   12.1  mm   7 w 3 d                  US EDC: 11/27/2016

Subchorionic hemorrhage:  Small, decreased in size from prior.

Maternal uterus/adnexae: Neither ovary is visualized. There is no
pelvic free fluid.
IMPRESSION: Single live intrauterine pregnancy estimated gestational age 7 weeks
3 days for estimated date of delivery 11/27/2016. Small subchorionic
hemorrhage has decreased in size from prior.

## 2018-03-16 DIAGNOSIS — K580 Irritable bowel syndrome with diarrhea: Secondary | ICD-10-CM | POA: Diagnosis not present

## 2018-03-16 DIAGNOSIS — I82409 Acute embolism and thrombosis of unspecified deep veins of unspecified lower extremity: Secondary | ICD-10-CM | POA: Diagnosis not present

## 2018-03-16 DIAGNOSIS — K219 Gastro-esophageal reflux disease without esophagitis: Secondary | ICD-10-CM | POA: Diagnosis not present

## 2018-03-16 DIAGNOSIS — R5382 Chronic fatigue, unspecified: Secondary | ICD-10-CM | POA: Diagnosis not present

## 2018-03-21 DIAGNOSIS — F411 Generalized anxiety disorder: Secondary | ICD-10-CM | POA: Diagnosis not present

## 2018-03-23 DIAGNOSIS — K580 Irritable bowel syndrome with diarrhea: Secondary | ICD-10-CM | POA: Diagnosis not present

## 2018-03-23 DIAGNOSIS — G43909 Migraine, unspecified, not intractable, without status migrainosus: Secondary | ICD-10-CM | POA: Diagnosis not present

## 2018-03-23 DIAGNOSIS — K219 Gastro-esophageal reflux disease without esophagitis: Secondary | ICD-10-CM | POA: Diagnosis not present

## 2018-03-23 DIAGNOSIS — I82409 Acute embolism and thrombosis of unspecified deep veins of unspecified lower extremity: Secondary | ICD-10-CM | POA: Diagnosis not present

## 2018-03-28 DIAGNOSIS — F411 Generalized anxiety disorder: Secondary | ICD-10-CM | POA: Diagnosis not present

## 2018-04-01 DIAGNOSIS — B379 Candidiasis, unspecified: Secondary | ICD-10-CM | POA: Diagnosis not present

## 2018-04-01 DIAGNOSIS — R102 Pelvic and perineal pain: Secondary | ICD-10-CM | POA: Diagnosis not present

## 2018-04-01 DIAGNOSIS — N760 Acute vaginitis: Secondary | ICD-10-CM | POA: Diagnosis not present

## 2018-04-01 DIAGNOSIS — B9689 Other specified bacterial agents as the cause of diseases classified elsewhere: Secondary | ICD-10-CM | POA: Diagnosis not present

## 2018-04-02 DIAGNOSIS — M25511 Pain in right shoulder: Secondary | ICD-10-CM | POA: Diagnosis not present

## 2018-04-02 DIAGNOSIS — M6281 Muscle weakness (generalized): Secondary | ICD-10-CM | POA: Diagnosis not present

## 2018-04-03 DIAGNOSIS — R102 Pelvic and perineal pain: Secondary | ICD-10-CM | POA: Diagnosis not present

## 2018-04-04 DIAGNOSIS — F411 Generalized anxiety disorder: Secondary | ICD-10-CM | POA: Diagnosis not present

## 2018-04-05 DIAGNOSIS — G43909 Migraine, unspecified, not intractable, without status migrainosus: Secondary | ICD-10-CM | POA: Diagnosis not present

## 2018-04-05 DIAGNOSIS — K219 Gastro-esophageal reflux disease without esophagitis: Secondary | ICD-10-CM | POA: Diagnosis not present

## 2018-04-05 DIAGNOSIS — K580 Irritable bowel syndrome with diarrhea: Secondary | ICD-10-CM | POA: Diagnosis not present

## 2018-04-05 DIAGNOSIS — I82409 Acute embolism and thrombosis of unspecified deep veins of unspecified lower extremity: Secondary | ICD-10-CM | POA: Diagnosis not present

## 2018-04-11 DIAGNOSIS — F411 Generalized anxiety disorder: Secondary | ICD-10-CM | POA: Diagnosis not present

## 2018-04-15 DIAGNOSIS — M25511 Pain in right shoulder: Secondary | ICD-10-CM | POA: Diagnosis not present

## 2018-04-15 DIAGNOSIS — M6281 Muscle weakness (generalized): Secondary | ICD-10-CM | POA: Diagnosis not present

## 2018-04-18 DIAGNOSIS — F411 Generalized anxiety disorder: Secondary | ICD-10-CM | POA: Diagnosis not present

## 2018-04-18 IMAGING — US US OB TRANSVAGINAL
1 series · 15 of 26 positions shown · non-contrast
Comparison: 04/13/2016

CLINICAL DATA: Recent spontaneous abortion, status post D&C 1
week ago, heavy bleeding

EXAM:
TRANSVAGINAL OB ULTRASOUND
TECHNIQUE: Transvaginal ultrasound was performed for complete evaluation of the
gestation as well as the maternal uterus, adnexal regions, and
pelvic cul-de-sac.

[Series 1: us ob transvaginal · 15 of 26 slices shown]
[im 1/26]
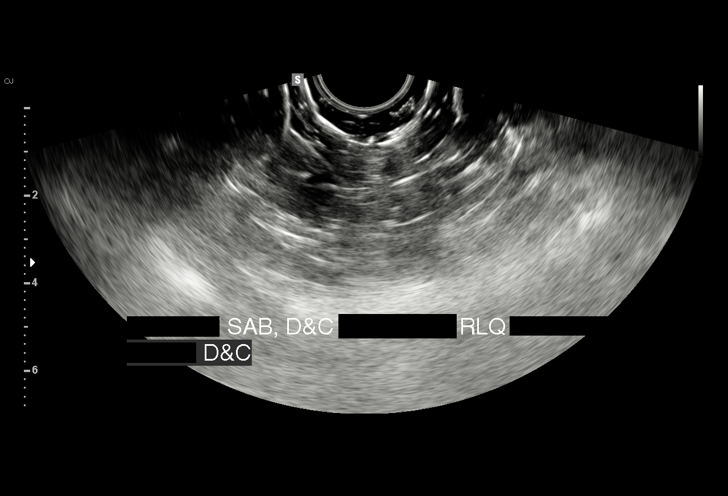
[im 3/26]
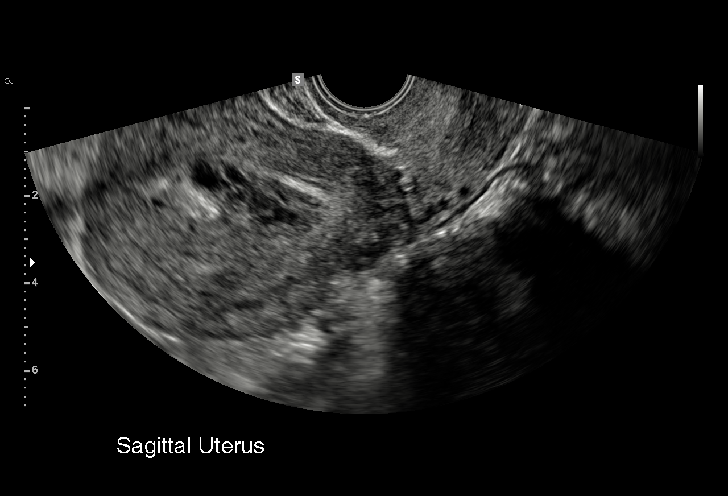
[im 5/26]
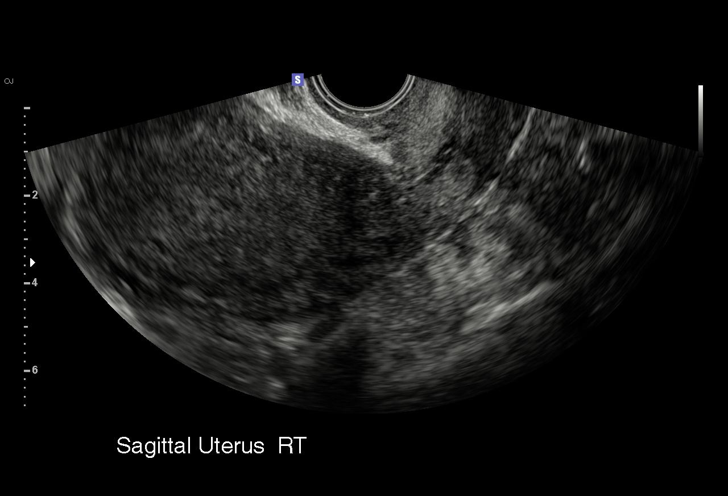
[im 7/26]
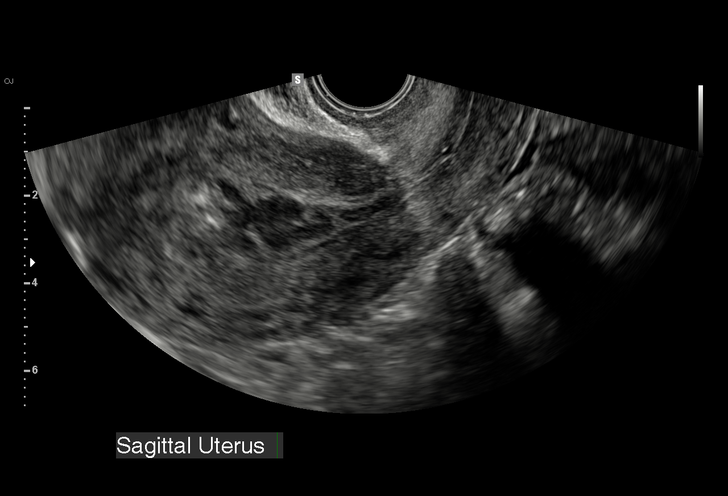
[im 8/26]
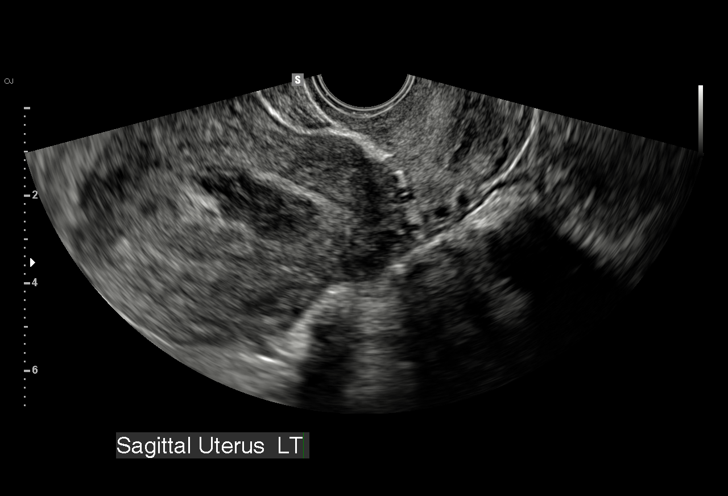
[im 10/26]
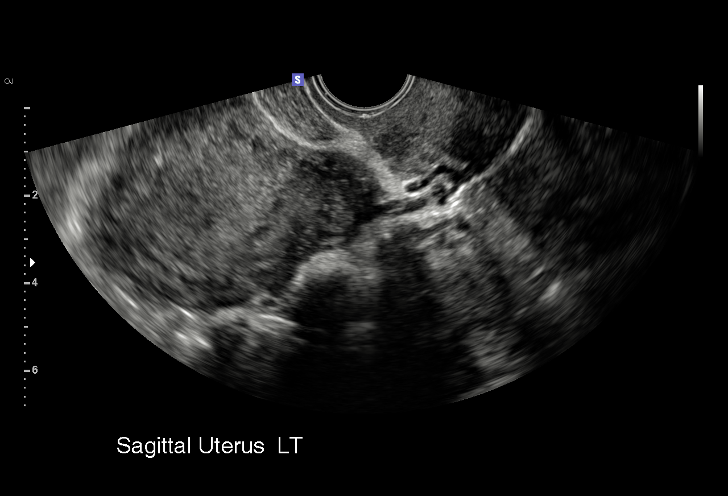
[im 12/26]
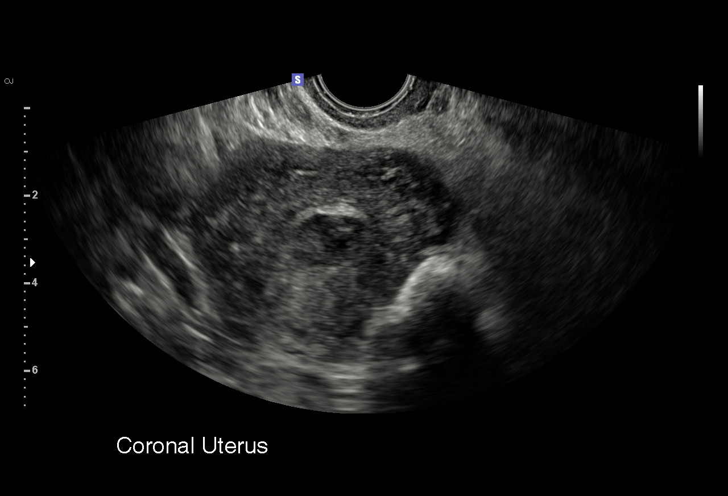
[im 14/26]
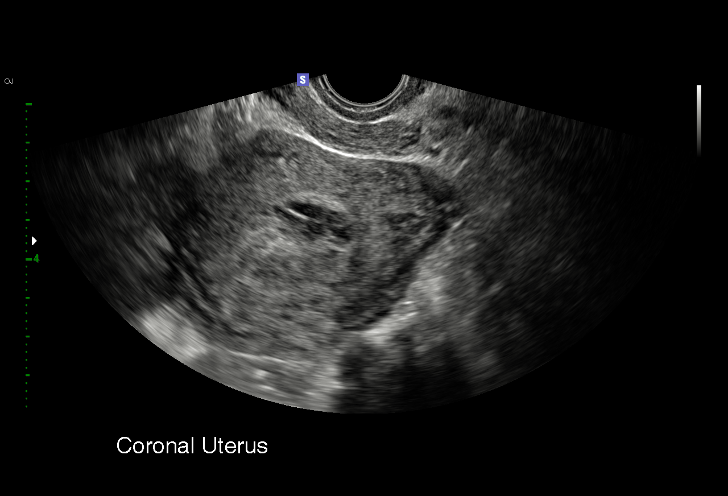
[im 15/26]
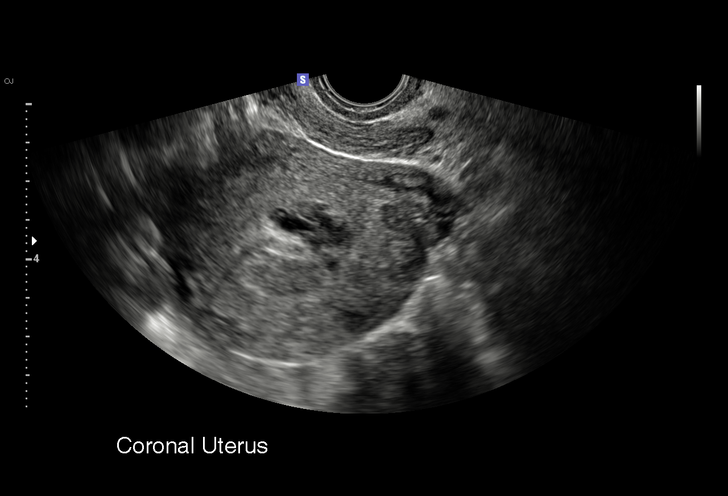
[im 17/26]
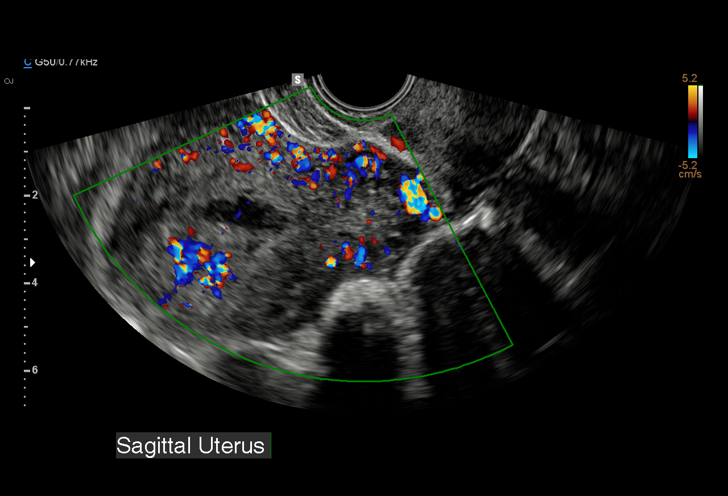
[im 19/26]
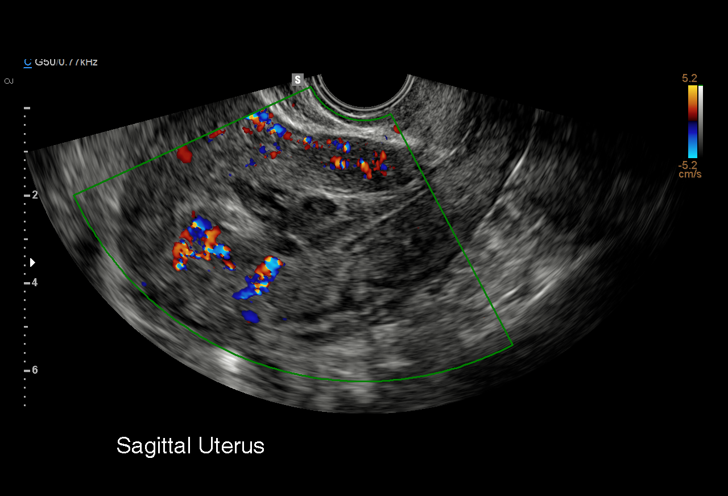
[im 20/26]
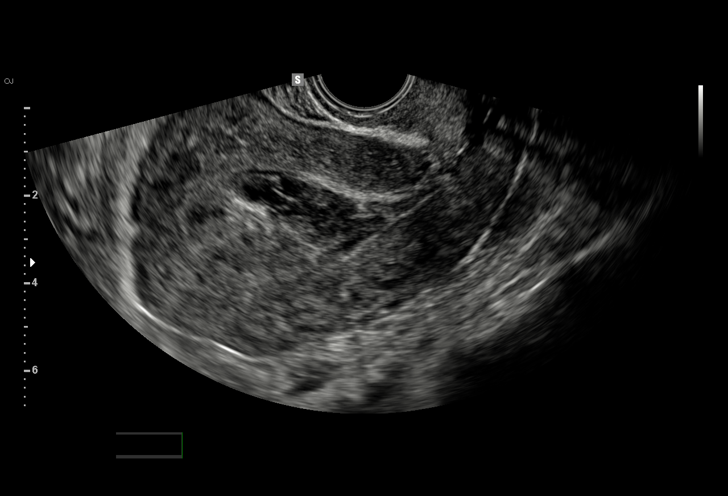
[im 22/26]
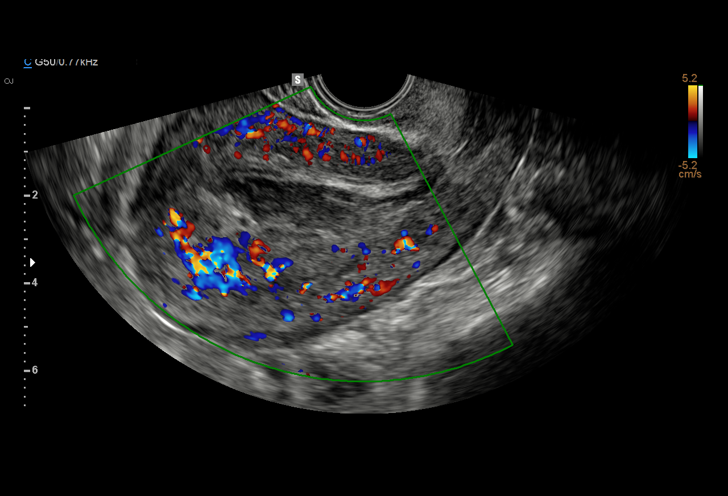
[im 24/26]
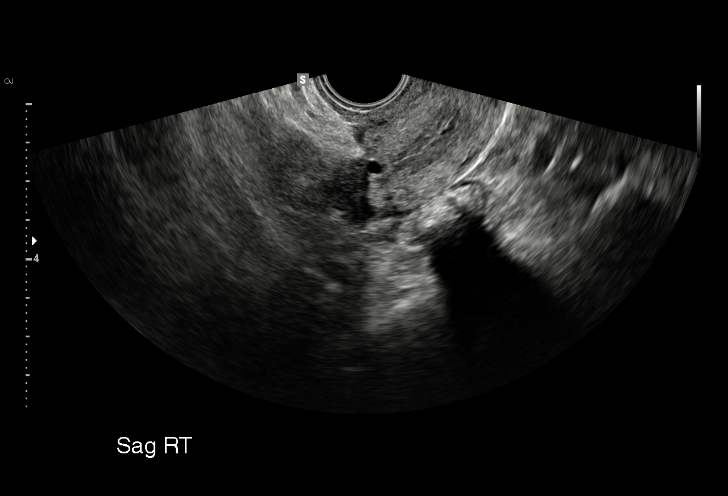
[im 26/26]
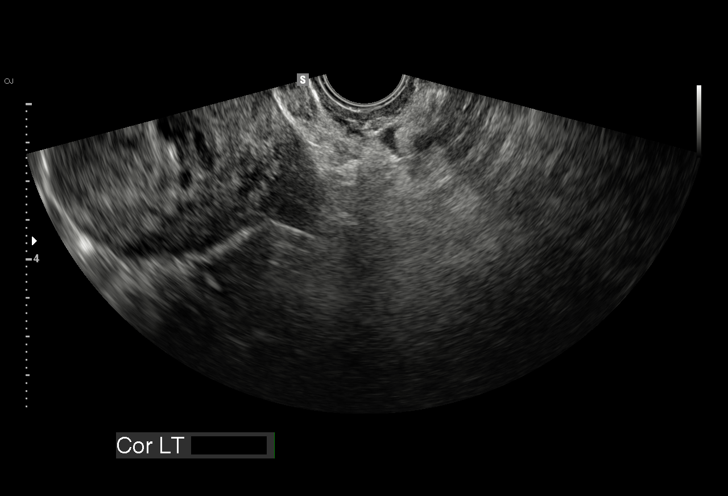

[15 of 26 positions shown; findings below may reference images not displayed]

FINDINGS: Intrauterine gestational sac: None

Subchorionic hemorrhage:  None visualized.

Maternal uterus/adnexae: Endometrium is thickened/ heterogeneous,
measuring up to 2.6 cm, with associated vascularity.

Bilateral ovaries are not discretely visualized.

No free fluid.
IMPRESSION: Heterogeneous/thickened endometrium, measuring up to 2.6 cm, with
associated vascularity. This appearance is suspicious for retained
products of conception.

These results will be called to the ordering clinician or
representative by the Radiologist Assistant, and communication
documented in the PACS or zVision Dashboard.

## 2018-04-19 IMAGING — CR DG CHEST 1V PORT
1 series · 1 of 1 positions shown · non-contrast
Comparison: 10/09/2014

CLINICAL DATA: Upper back pain post D and C today. Also chest and
upper back pain with inspiration. No cardiac/pulm hx reported.

EXAM:
PORTABLE CHEST 1 VIEW

[view not recorded]
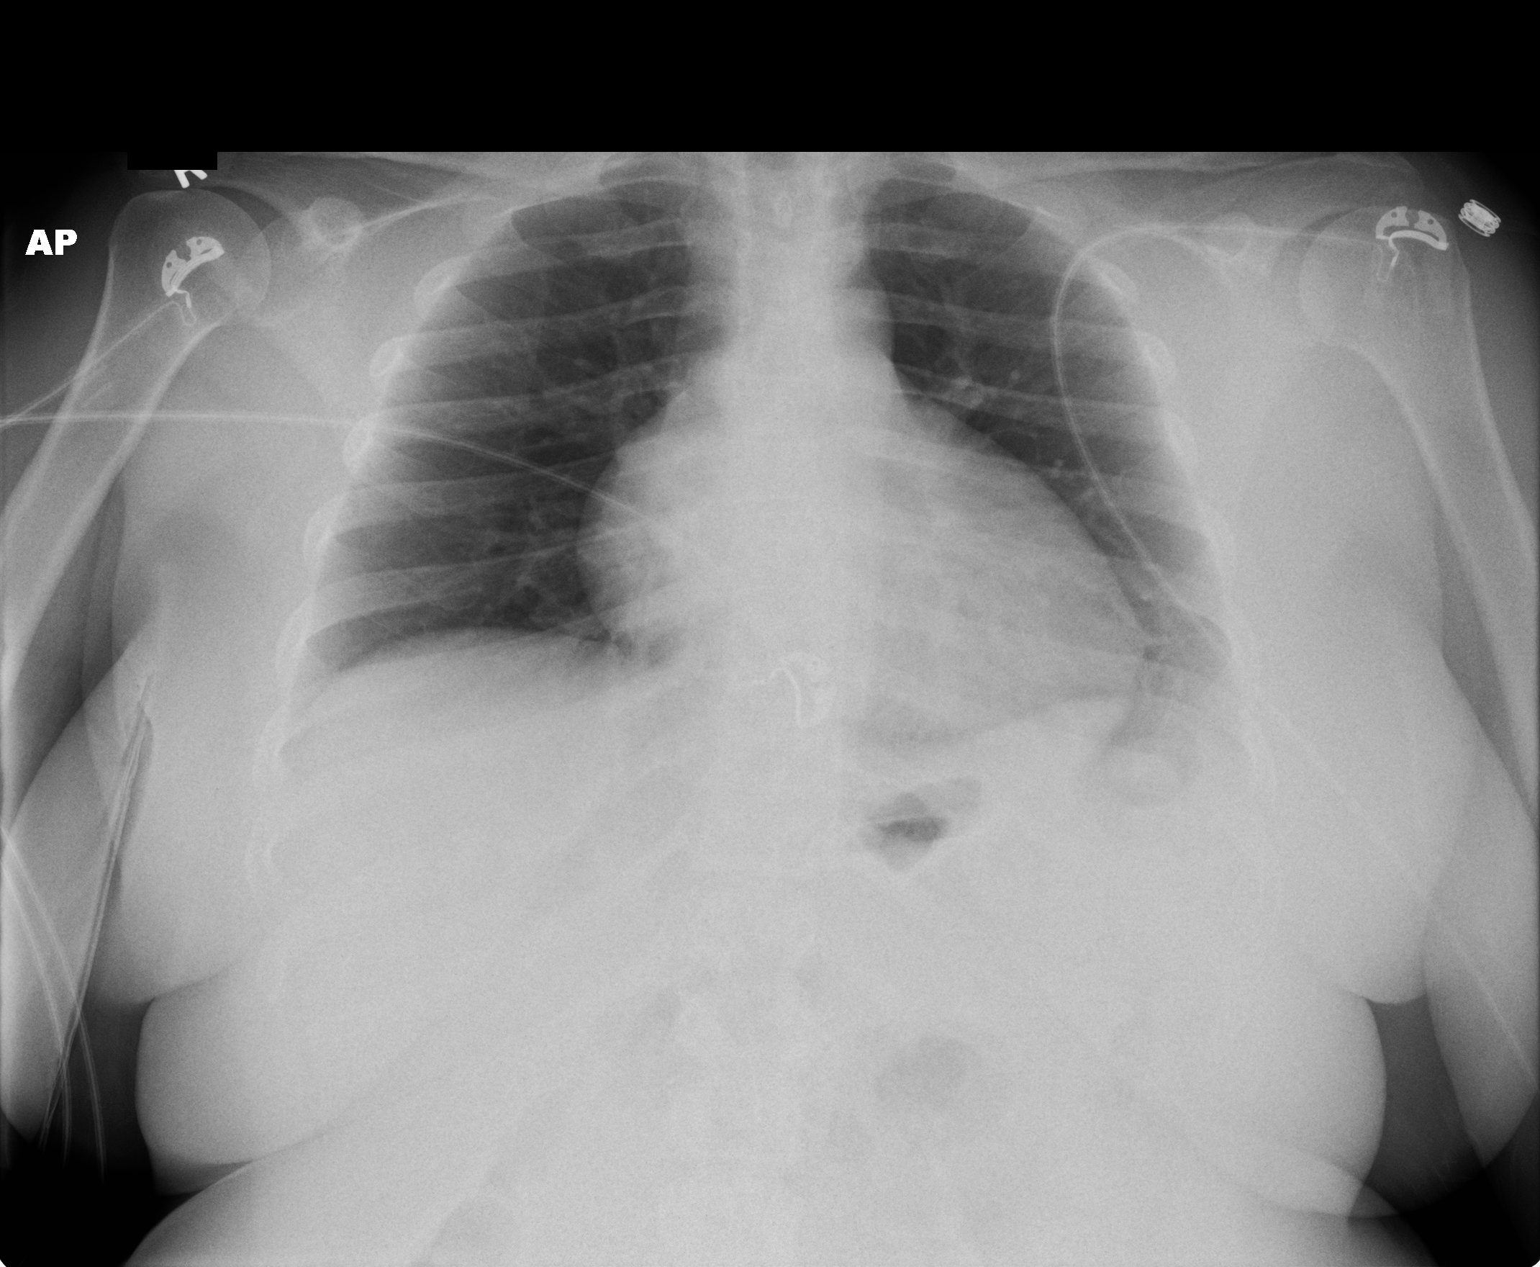

[1 of 1 positions shown; findings below may reference images not displayed]

FINDINGS: Midline trachea. Normal heart size for level of inspiration. No
pleural effusion or pneumothorax. left lung base poorly evaluated
secondary to overlying breast tissue and AP portable technique.
Otherwise, lungs are clear. No congestive failure.
IMPRESSION: No acute cardiopulmonary disease.

## 2018-04-25 DIAGNOSIS — F411 Generalized anxiety disorder: Secondary | ICD-10-CM | POA: Diagnosis not present

## 2018-04-28 ENCOUNTER — Encounter (HOSPITAL_COMMUNITY): Payer: Self-pay

## 2018-05-02 DIAGNOSIS — F411 Generalized anxiety disorder: Secondary | ICD-10-CM | POA: Diagnosis not present

## 2018-05-02 DIAGNOSIS — M6281 Muscle weakness (generalized): Secondary | ICD-10-CM | POA: Diagnosis not present

## 2018-05-02 DIAGNOSIS — M25511 Pain in right shoulder: Secondary | ICD-10-CM | POA: Diagnosis not present

## 2018-05-03 DIAGNOSIS — G43909 Migraine, unspecified, not intractable, without status migrainosus: Secondary | ICD-10-CM | POA: Diagnosis not present

## 2018-05-03 DIAGNOSIS — I82409 Acute embolism and thrombosis of unspecified deep veins of unspecified lower extremity: Secondary | ICD-10-CM | POA: Diagnosis not present

## 2018-05-03 DIAGNOSIS — M159 Polyosteoarthritis, unspecified: Secondary | ICD-10-CM | POA: Diagnosis not present

## 2018-05-03 DIAGNOSIS — K219 Gastro-esophageal reflux disease without esophagitis: Secondary | ICD-10-CM | POA: Diagnosis not present

## 2018-05-28 DIAGNOSIS — M25511 Pain in right shoulder: Secondary | ICD-10-CM | POA: Diagnosis not present

## 2018-05-28 DIAGNOSIS — M6281 Muscle weakness (generalized): Secondary | ICD-10-CM | POA: Diagnosis not present

## 2018-05-31 DIAGNOSIS — M159 Polyosteoarthritis, unspecified: Secondary | ICD-10-CM | POA: Diagnosis not present

## 2018-05-31 DIAGNOSIS — G43909 Migraine, unspecified, not intractable, without status migrainosus: Secondary | ICD-10-CM | POA: Diagnosis not present

## 2018-05-31 DIAGNOSIS — I82409 Acute embolism and thrombosis of unspecified deep veins of unspecified lower extremity: Secondary | ICD-10-CM | POA: Diagnosis not present

## 2018-05-31 DIAGNOSIS — G894 Chronic pain syndrome: Secondary | ICD-10-CM | POA: Diagnosis not present

## 2018-06-05 ENCOUNTER — Encounter: Payer: Self-pay | Admitting: Neurology

## 2018-06-05 ENCOUNTER — Encounter: Payer: Self-pay | Admitting: *Deleted

## 2018-06-05 ENCOUNTER — Ambulatory Visit (INDEPENDENT_AMBULATORY_CARE_PROVIDER_SITE_OTHER): Payer: BLUE CROSS/BLUE SHIELD | Admitting: Neurology

## 2018-06-05 VITALS — BP 107/69 | HR 80 | Ht <= 58 in | Wt 174.0 lb

## 2018-06-05 DIAGNOSIS — G43709 Chronic migraine without aura, not intractable, without status migrainosus: Secondary | ICD-10-CM

## 2018-06-05 DIAGNOSIS — G8929 Other chronic pain: Secondary | ICD-10-CM

## 2018-06-05 DIAGNOSIS — H05111 Granuloma of right orbit: Secondary | ICD-10-CM | POA: Diagnosis not present

## 2018-06-05 DIAGNOSIS — R51 Headache with orthostatic component, not elsewhere classified: Secondary | ICD-10-CM

## 2018-06-05 DIAGNOSIS — H5711 Ocular pain, right eye: Secondary | ICD-10-CM

## 2018-06-05 DIAGNOSIS — R519 Headache, unspecified: Secondary | ICD-10-CM

## 2018-06-05 MED ORDER — TOPIRAMATE ER 100 MG PO SPRINKLE CAP24: 100.0000 mg | EXTENDED_RELEASE_CAPSULE | Freq: Every day | ORAL | 11 refills | Status: DC

## 2018-06-05 NOTE — Patient Instructions (Addendum)
Qudexy: 25mg  x 1 week, 50mg  x 1-2 weeks then 100mg  MRI of the brain and orbits  Topiramate extended-release capsules What is this medicine? TOPIRAMATE (toe PYRE a mate) is used to treat seizures in adults or children with epilepsy. It is also used for the prevention of migraine headaches. This medicine may be used for other purposes; ask your health care provider or pharmacist if you have questions. COMMON BRAND NAME(S): Trokendi XR What should I tell my health care provider before I take this medicine? They need to know if you have any of these conditions: -cirrhosis of the liver or liver disease -diarrhea -glaucoma -kidney stones or kidney disease -lung disease like asthma, obstructive pulmonary disease, emphysema -metabolic acidosis -on a ketogenic diet -scheduled for surgery or a procedure -suicidal thoughts, plans, or attempt; a previous suicide attempt by you or a family member -an unusual or allergic reaction to topiramate, other medicines, foods, dyes, or preservatives -pregnant or trying to get pregnant -breast-feeding How should I use this medicine? Take this medicine by mouth with a glass of water. Follow the directions on the prescription label. Trokendi XR capsules must be swallowed whole. Do not sprinkle on food, break, crush, dissolve, or chew. Qudexy XR capsules may be swallowed whole or opened and sprinkled on a small amount of soft food. This mixture must be swallowed immediately. Do not chew or store mixture for later use. You may take this medicine with meals. Take your medicine at regular intervals. Do not take it more often than directed. Talk to your pediatrician regarding the use of this medicine in children. Special care may be needed. While Trokendi XR may be prescribed for children as young as 6 years and Qudexy XR may be prescribed for children as young as 2 years for selected conditions, precautions do apply. Overdosage: If you think you have taken too much of  this medicine contact a poison control center or emergency room at once. NOTE: This medicine is only for you. Do not share this medicine with others. What if I miss a dose? If you miss a dose, take it as soon as you can. If it is almost time for your next dose, take only that dose. Do not take double or extra doses. What may interact with this medicine? Do not take this medicine with any of the following medications: -probenecid This medicine may also interact with the following medications: -acetazolamide -alcohol -amitriptyline -birth control pills -digoxin -hydrochlorothiazide -lithium -medicines for pain, sleep, or muscle relaxation -metformin -methazolamide -other seizure or epilepsy medicines -pioglitazone -risperidone This list may not describe all possible interactions. Give your health care provider a list of all the medicines, herbs, non-prescription drugs, or dietary supplements you use. Also tell them if you smoke, drink alcohol, or use illegal drugs. Some items may interact with your medicine. What should I watch for while using this medicine? Visit your doctor or health care professional for regular checks on your progress. Do not stop taking this medicine suddenly. This increases the risk of seizures if you are using this medicine to control epilepsy. Wear a medical identification bracelet or chain to say you have epilepsy or seizures, and carry a card that lists all your medicines. This medicine can decrease sweating and increase your body temperature. Watch for signs of deceased sweating or fever, especially in children. Avoid extreme heat, hot baths, and saunas. Be careful about exercising, especially in hot weather. Contact your health care provider right away if you notice a fever or  decrease in sweating. You should drink plenty of fluids while taking this medicine. If you have had kidney stones in the past, this will help to reduce your chances of forming kidney  stones. If you have stomach pain, with nausea or vomiting and yellowing of your eyes or skin, call your doctor immediately. You may get drowsy, dizzy, or have blurred vision. Do not drive, use machinery, or do anything that needs mental alertness until you know how this medicine affects you. To reduce dizziness, do not sit or stand up quickly, especially if you are an older patient. Alcohol can increase drowsiness and dizziness. Avoid alcoholic drinks. Do not drink alcohol for 6 hours before or 6 hours after taking Trokendi XR. If you notice blurred vision, eye pain, or other eye problems, seek medical attention at once for an eye exam. The use of this medicine may increase the chance of suicidal thoughts or actions. Pay special attention to how you are responding while on this medicine. Any worsening of mood, or thoughts of suicide or dying should be reported to your health care professional right away. This medicine may increase the chance of developing metabolic acidosis. If left untreated, this can cause kidney stones, bone disease, or slowed growth in children. Symptoms include breathing fast, fatigue, loss of appetite, irregular heartbeat, or loss of consciousness. Call your doctor immediately if you experience any of these side effects. Also, tell your doctor about any surgery you plan on having while taking this medicine since this may increase your risk for metabolic acidosis. Birth control pills may not work properly while you are taking this medicine. Talk to your doctor about using an extra method of birth control. Women who become pregnant while using this medicine may enroll in the [place] Pregnancy Registry by calling [phone]. This registry collects information about the safety of antiepileptic drug use during pregnancy. What side effects may I notice from receiving this medicine? Side effects that you should report to your doctor or health care professional  as soon as possible: -allergic reactions like skin rash, itching or hives, swelling of the face, lips, or tongue -decreased sweating and/or rise in body temperature -depression -difficulty breathing, fast or irregular breathing patterns -difficulty speaking -difficulty walking or controlling muscle movements -hearing impairment -redness, blistering, peeling or loosening of the skin, including inside the mouth -tingling, pain or numbness in the hands or feet -unusually weak or tired -worsening of mood, thoughts or actions of suicide or dying Side effects that usually do not require medical attention (report to your doctor or health care professional if they continue or are bothersome): -altered taste -back pain, joint or muscle aches and pains -diarrhea, or constipation -headache -loss of appetite -nausea -stomach upset, indigestion -tremors This list may not describe all possible side effects. Call your doctor for medical advice about side effects. You may report side effects to FDA at 1-800-FDA-1088. Where should I keep my medicine? Keep out of the reach of children. Store at room temperature between 15 and 30 degrees C (59 and 86 degrees F) in a tightly closed container. Protect from moisture. Throw away any unused medicine after the expiration date. NOTE: This sheet is a summary. It may not cover all possible information. If you have questions about this medicine, talk to your doctor, pharmacist, or health care provider.  2018 Elsevier/Gold Standard (2016-02-18 12:33:11)   Idiopathic Intracranial Hypertension Idiopathic intracranial hypertension (IIH) is a condition that increases pressure around the brain. The fluid that  surrounds the brain and spinal cord (cerebrospinal fluid, CSF) increases and causes the pressure. Idiopathic means that the cause of this condition is not known. IIH affects the brain and spinal cord (is a neurological disorder). If this condition is not treated,  it can cause vision loss or blindness. What increases the risk? You are more likely to develop this condition if:  You are severely overweight (obese).  You are a woman who has not gone through menopause.  You take certain medicines, such as birth control or steroids.  What are the signs or symptoms? Symptoms of IIH include:  Headaches. This is the most common symptom.  Pain in the shoulders or neck.  Nausea and vomiting.  A "rushing water" or pulsing sound within the ears (pulsatile tinnitus).  Double vision.  Blurred vision.  Brief episodes of complete vision loss.  How is this diagnosed? This condition may be diagnosed based on:  Your symptoms.  Your medical history.  CT scan of the brain.  MRI of the brain.  Magnetic resonance venogram (MRV) to check veins in the brain.  Diagnostic lumbar puncture. This is a procedure to remove and examine a sample of cerebrospinal fluid. This procedure can determine whether too much fluid may be causing IIH.  A thorough eye exam to check for swelling or nerve damage in the eyes.  How is this treated? Treatment for this condition depends on your symptoms. The goal of treatment is to decrease the pressure around your brain. Common treatments include:  Medicines to decrease the production of spinal fluid and lower the pressure within your skull.  Medicines to prevent or treat headaches.  Surgery to place drains (shunts) in your brain to remove excess fluid.  Lumbar puncture to remove excess cerebrospinal fluid.  Follow these instructions at home:  If you are overweight or obese, work with your health care provider to lose weight.  Take over-the-counter and prescription medicines only as told by your health care provider.  Do not drive or use heavy machinery while taking medicines that can make you sleepy.  Keep all follow-up visits as told by your health care provider. This is important. Contact a health care provider  if:  You have changes in your vision, such as: ? Double vision. ? Not being able to see colors (color vision). Get help right away if:  You have any of the following symptoms and they get worse or do not get better. ? Headaches. ? Nausea. ? Vomiting. ? Vision changes or difficulty seeing. Summary  Idiopathic intracranial hypertension (IIH) is a condition that increases pressure around the brain. The cause is not known (is idiopathic).  The most common symptom of IIH is headaches.  Treatment may include medicines or surgery to relieve the pressure on your brain. This information is not intended to replace advice given to you by your health care provider. Make sure you discuss any questions you have with your health care provider. Document Released: 01/08/2002 Document Revised: 09/20/2016 Document Reviewed: 09/20/2016 Elsevier Interactive Patient Education  2017 [employer].    Migraine Headache A migraine headache is an intense, throbbing pain on one side or both sides of the head. Migraines may also cause other symptoms, such as nausea, vomiting, and sensitivity to light and noise. What are the causes? Doing or taking certain things may also trigger migraines, such as:  Alcohol.  Smoking.  Medicines, such as: ? Medicine used to treat chest pain (nitroglycerine). ? Birth control pills. ? Estrogen pills. ?  Certain blood pressure medicines.  Aged cheeses, chocolate, or caffeine.  Foods or drinks that contain nitrates, glutamate, aspartame, or tyramine.  Physical activity.  Other things that may trigger a migraine include:  Menstruation.  Pregnancy.  Hunger.  Stress, lack of sleep, too much sleep, or fatigue.  Weather changes.  What increases the risk? The following factors may make you more likely to experience migraine headaches:  Age. Risk increases with age.  Family history of migraine headaches.  Being Caucasian.  Depression and  anxiety.  Obesity.  Being a woman.  Having a hole in the heart (patent foramen ovale) or other heart problems.  What are the signs or symptoms? The main symptom of this condition is pulsating or throbbing pain. Pain may:  Happen in any area of the head, such as on one side or both sides.  Interfere with daily activities.  Get worse with physical activity.  Get worse with exposure to bright lights or loud noises.  Other symptoms may include:  Nausea.  Vomiting.  Dizziness.  General sensitivity to bright lights, loud noises, or smells.  Before you get a migraine, you may get warning signs that a migraine is developing (aura). An aura may include:  Seeing flashing lights or having blind spots.  Seeing bright spots, halos, or zigzag lines.  Having tunnel vision or blurred vision.  Having numbness or a tingling feeling.  Having trouble talking.  Having muscle weakness.  How is this diagnosed? A migraine headache can be diagnosed based on:  Your symptoms.  A physical exam.  Tests, such as CT scan or MRI of the head. These imaging tests can help rule out other causes of headaches.  Taking fluid from the spine (lumbar puncture) and analyzing it (cerebrospinal fluid analysis, or CSF analysis).  How is this treated? A migraine headache is usually treated with medicines that:  Relieve pain.  Relieve nausea.  Prevent migraines from coming back.  Treatment may also include:  Acupuncture.  Lifestyle changes like avoiding foods that trigger migraines.  Follow these instructions at home: Medicines  Take over-the-counter and prescription medicines only as told by your health care provider.  Do not drive or use heavy machinery while taking prescription pain medicine.  To prevent or treat constipation while you are taking prescription pain medicine, your health care provider may recommend that you: ? Drink enough fluid to keep your urine clear or pale  yellow. ? Take over-the-counter or prescription medicines. ? Eat foods that are high in fiber, such as fresh fruits and vegetables, whole grains, and beans. ? Limit foods that are high in fat and processed sugars, such as fried and sweet foods. Lifestyle  Avoid alcohol use.  Do not use any products that contain nicotine or tobacco, such as cigarettes and e-cigarettes. If you need help quitting, ask your health care provider.  Get at least 8 hours of sleep every night.  Limit your stress. General instructions   Keep a journal to find out what may trigger your migraine headaches. For example, write down: ? What you eat and drink. ? How much sleep you get. ? Any change to your diet or medicines.  If you have a migraine: ? Avoid things that make your symptoms worse, such as bright lights. ? It may help to lie down in a dark, quiet room. ? Do not drive or use heavy machinery. ? Ask your health care provider what activities are safe for you while you are experiencing symptoms.  Keep all  follow-up visits as told by your health care provider. This is important. Contact a health care provider if:  You develop symptoms that are different or more severe than your usual migraine symptoms. Get help right away if:  Your migraine becomes severe.  You have a fever.  You have a stiff neck.  You have vision loss.  Your muscles feel weak or like you cannot control them.  You start to lose your balance often.  You develop trouble walking.  You faint. This information is not intended to replace advice given to you by your health care provider. Make sure you discuss any questions you have with your health care provider. Document Released: 10/30/2005 Document Revised: 05/19/2016 Document Reviewed: 04/17/2016 Elsevier Interactive Patient Education  2017 [employer].

## 2018-06-05 NOTE — Progress Notes (Signed)
[facility]    Provider:  Dr Jaynee Eagles Referring Provider: Cher Nakai, MD Primary Care Physician:  Cher Nakai, MD  CC:  Headaches  HPI:  Nina Wood is a 32 y.o. female here as a referral from Dr. Truman Hayward for migraines. PMHx DVT, GERD, IBS, Anemia, Anxiety, Chronic Pain, anxiety, obesity, Fibromyalgia. She has had migraines for several years. Started worsening with tingling in the face under the eyes all day and won't go away. The tingling started 6 months ago. In the bridge of the nose and cheeks and right under the nose. Continuous. She went 3 weeks without glasses and did not improve. Glasses on her face hurts. She has daily headaches. She can wake with them often. She denies snoring at night or sleep apnea medications. Daily headaches. It is always right above the eye and spreads to the forehead, pounding, throbbing, light and sound sensitivity. Daily headaches. 15 are migrainous. Plus tingling of face. Eyes hurt on movement. She has ringing in her ears and pressure in the ear. In the right ear. Headaches can be positional. She takes a lot of allergy medication.  Headaches started worsening 6 months ago. She has gained 20 pounds.  She also reports back pain, mild, stable, no changes in bowel or bladder or weakness. No other focal neurologic deficits, associated symptoms, inciting events or modifiable factors.  Gabapentin, meclizine   Reviewed notes, labs and imaging from outside physicians, which showed:  Reviewed images and agree with the following xr lumbar 03/2017:  IMPRESSION: Normal alignment of the lumbar spine. No acute displaced fractures identified.   Review of Systems: Patient complains of symptoms per HPI as well as the following symptoms: headache, numbness, insomnia, aching muscles. Pertinent negatives and positives per HPI. All others negative.   Social History   Socioeconomic History  . Marital status: Single    Spouse name: Not on file  . Number of  children: 3  . Years of education: Not on file  . Highest education level: Bachelor's degree (e.g., BA, AB, BS)  Occupational History  . Not on file  Social Needs  . Financial resource strain: Not on file  . Food insecurity:    Worry: Not on file    Inability: Not on file  . Transportation needs:    Medical: Not on file    Non-medical: Not on file  Tobacco Use  . Smoking status: Never Smoker  . Smokeless tobacco: Never Used  Substance and Sexual Activity  . Alcohol use: Yes    Comment: OCCASIONAL  . Drug use: No  . Sexual activity: Yes    Birth control/protection: None  Lifestyle  . Physical activity:    Days per week: Not on file    Minutes per session: Not on file  . Stress: Not on file  Relationships  . Social connections:    Talks on phone: Not on file    Gets together: Not on file    Attends religious service: Not on file    Active member of club or organization: Not on file    Attends meetings of clubs or organizations: Not on file    Relationship status: Not on file  . Intimate partner violence:    Fear of current or ex partner: Not on file    Emotionally abused: Not on file    Physically abused: Not on file    Forced sexual activity: Not on file  Other Topics Concern  . Not on file  Social History Narrative  Lives at home with her children   Right handed   Caffeine: 3 cups of coffee daily during the week     Family History  Problem Relation Age of Onset  . Hypertension Mother   . Breast cancer Mother   . Prostate cancer Father   . Hypertension Maternal Uncle   . Diabetes Paternal Aunt   . Cancer Paternal Aunt   . Cancer Maternal Grandmother   . Diabetes Sister     Past Medical History:  Diagnosis Date  . Allergic rhinitis   . Allergy to cats   . Allergy to dogs   . Anemia   . Anxiety   . Asthma   . BMI 37.0-37.9, adult   . Breast cyst   . Chronic pain syndrome   . Deep vein thrombosis (DVT) ([place])   . Endometriosis, uterus   .  Environmental allergies   . Fibromyalgia syndrome   . Generalized osteoarthrosis   . GERD (gastroesophageal reflux disease)   . Headache   . Hip discomfort   . IBS (irritable bowel syndrome)    with diarrhea  . Obesity   . Ovarian cyst   . Paroxysmal dyspnea   . Uterine fibroid     Past Surgical History:  Procedure Laterality Date  . APPENDECTOMY  2003  . CESAREAN SECTION    . DILATION AND CURETTAGE OF UTERUS     1 WEEK AGO  . DILATION AND EVACUATION Bilateral 05/18/2016   Procedure: DILATATION AND EVACUATION;  Surgeon: Bobbye Charleston, MD;  Location: [facility];  Service: Gynecology;  Laterality: Bilateral;  . ECTOPIC PREGNANCY SURGERY    . etopi    . GASTRIC BYPASS  03/14/2012  . INDUCED ABORTION    . OVARIAN CYST SURGERY      Current Outpatient Medications  Medication Sig Dispense Refill  . albuterol (PROAIR HFA) 108 (90 Base) MCG/ACT inhaler Inhale 2 puffs into the lungs every 6 (six) hours as needed for wheezing or shortness of breath.    . budesonide (RHINOCORT AQUA) 32 MCG/ACT nasal spray Place 1 spray into both nostrils as needed.     . cetirizine (ZYRTEC) 10 MG tablet Take 20-30 mg by mouth at bedtime.     . clonazePAM (KLONOPIN) 0.5 MG tablet Take 0.5 mg by mouth as needed.    . etonogestrel (NEXPLANON) 68 MG IMPL implant 1 each by Subdermal route.    . ferrous sulfate 325 (65 FE) MG EC tablet Take 325 mg by mouth every 3 (three) days.     . Fluticasone Furoate 50 MCG/ACT AEPB Inhale 2 sprays into the lungs daily.    Marland Kitchen gabapentin (NEURONTIN) 800 MG tablet Take 400-800 mg by mouth at bedtime.     Marland Kitchen HYDROcodone-acetaminophen (NORCO) 7.5-325 MG tablet Take 0.5-1 tablets by mouth every 6 (six) hours as needed for moderate pain.    . Hyprom-Naphaz-Polysorb-Zn Sulf (CLEAR EYES COMPLETE OP) Place 1 drop into both eyes 2 (two) times daily as needed (dry eyes/itching).     Marland Kitchen lidocaine (LIDODERM) 5 % Place 1 patch onto the skin daily as needed (pain).     . meclizine (ANTIVERT)  25 MG tablet Take 25 mg by mouth every 6 (six) hours as needed.    . meloxicam (MOBIC) 7.5 MG tablet Take 7.5 mg by mouth 2 (two) times daily as needed.    . montelukast (SINGULAIR) 10 MG tablet Take one tablet once daily 30 tablet 8  . Olopatadine HCl (PAZEO) 0.7 % SOLN Apply 1  drop to eye daily.    . ondansetron (ZOFRAN) 4 MG tablet Take 1 tablet (4 mg total) by mouth every 6 (six) hours. (Patient taking differently: Take 4 mg by mouth every 6 (six) hours as needed. ) 12 tablet 0  . orphenadrine (NORFLEX) 100 MG tablet Take 100 mg by mouth 2 (two) times daily as needed for muscle spasms.    . ranitidine (ZANTAC) 300 MG tablet Take 1 tablet (300 mg total) by mouth at bedtime. 30 tablet 5  . traMADol (ULTRAM) 50 MG tablet Take 1 tablet (50 mg total) by mouth every 6 (six) hours as needed. (Patient taking differently: Take 50 mg by mouth every 6 (six) hours as needed for severe pain. ) 10 tablet 0  . triamcinolone cream (KENALOG) 0.5 % Apply 1 application topically every 4 (four) hours as needed (rash/allergies).     . EPINEPHrine (EPIPEN 2-PAK) 0.3 mg/0.3 mL IJ SOAJ injection Use as directed for life threatening allergic reactions 2 Device 3  . topiramate ER (QUDEXY XR) CS24 sprinkle capsule Take 1 capsule (100 mg total) by mouth daily. 30 each 11   No current facility-administered medications for this visit.     Allergies as of 06/05/2018 - Review Complete 06/05/2018  Allergen Reaction Noted  . Fish allergy Anaphylaxis 02/24/2017  . Other Swelling 04/27/2015  . Penicillins Hives 04/27/2015  . Shellfish allergy Anaphylaxis 04/27/2015  . Peanut butter flavor Hives 04/27/2015  . Histamine Other (See Comments) 04/27/2015  . Voltaren [diclofenac] Rash and Other (See Comments) 04/27/2015    Vitals: BP 107/69 (BP Location: Right Arm, Patient Position: Sitting)   Pulse 80   Ht 4' 7.5" (1.41 m)   Wt 174 lb (78.9 kg)   BMI 39.72 kg/m  Last Weight:  Wt Readings from Last 1 Encounters:    06/05/18 174 lb (78.9 kg)   Last Height:   Ht Readings from Last 1 Encounters:  06/05/18 4' 7.5" (1.41 m)   Physical exam: Exam: Gen: NAD, conversant, well nourised, obese, well groomed                     CV: RRR, no MRG. No Carotid Bruits. No peripheral edema, warm, nontender Eyes: Conjunctivae clear without exudates or hemorrhage  Neuro: Detailed Neurologic Exam  Speech:    Speech is normal; fluent and spontaneous with normal comprehension.  Cognition:    The patient is oriented to person, place, and time;     recent and remote memory intact;     language fluent;     normal attention, concentration,     fund of knowledge Cranial Nerves:    The pupils are equal, round, and reactive to light. The fundi are normal and spontaneous venous pulsations are present. Visual fields are full to finger confrontation. Extraocular movements are intact. Trigeminal sensation is intact and the muscles of mastication are normal. The face is symmetric. The palate elevates in the midline. Hearing intact. Voice is normal. Shoulder shrug is normal. The tongue has normal motion without fasciculations.   Coordination:    Normal finger to nose and heel to shin. Normal rapid alternating movements.   Gait:    Heel-toe and tandem gait are normal.   Motor Observation:    No asymmetry, no atrophy, and no involuntary movements noted. Tone:    Normal muscle tone.    Posture:    Posture is normal. normal erect    Strength:    Strength is V/V in the upper and  lower limbs.      Sensation: intact to LT     Reflex Exam:  DTR's:    Deep tendon reflexes in the upper and lower extremities are normal bilaterally.   Toes:    The toes are downgoing bilaterally.   Clonus:    Clonus is absent.       Assessment/Plan:  32 year old with intractable headaches. PMHx DVT, GERD, IBS, Anemia, Anxiety, Chronic Pain, anxiety, obesity, Fibromyalgia. Likely chronic migraines but needs thorough eval.   MRI  brain due to concerning symptoms of morning headaches, positional headaches,vision changes  to look for space occupying mass, chiari or intracranial hypertension (pseudotumor). MRI orbits due to pain on eye movement, orbital pain on right to evaluate for etiologies such as orbital pseudotumor.  MRI brain and orbits w/wo contrast Less likely IIH but may consider MP in future Qudexy: 25mg  x 1 week, 50mg  x 1-2 weeks then 100mg  for prevention  Orders Placed This Encounter  Procedures  . MR BRAIN W WO CONTRAST  . MR ORBITS W WO CONTRAST  . TSH     Discussed: To prevent or relieve headaches, try the following: Cool Compress. Lie down and place a cool compress on your head.  Avoid headache triggers. If certain foods or odors seem to have triggered your migraines in the past, avoid them. A headache diary might help you identify triggers.  Include physical activity in your daily routine. Try a daily walk or other moderate aerobic exercise.  Manage stress. Find healthy ways to cope with the stressors, such as delegating tasks on your to-do list.  Practice relaxation techniques. Try deep breathing, yoga, massage and visualization.  Eat regularly. Eating regularly scheduled meals and maintaining a healthy diet might help prevent headaches. Also, drink plenty of fluids.  Follow a regular sleep schedule. Sleep deprivation might contribute to headaches Consider biofeedback. With this mind-body technique, you learn to control certain bodily functions - such as muscle tension, heart rate and blood pressure - to prevent headaches or reduce headache pain.    Proceed to emergency room if you experience new or worsening symptoms or symptoms do not resolve, if you have new neurologic symptoms or if headache is severe, or for any concerning symptom.   Provided education and documentation from American headache Society toolbox including articles on: chronic migraine medication overuse headache, chronic migraines,  prevention of migraines, behavioral and other nonpharmacologic treatments for headache.  Cc: Dr. Ivan Croft, MD  [facility] [address] [place], [place] [address]  Phone [phone] Fax [phone]

## 2018-06-06 ENCOUNTER — Encounter: Payer: Self-pay | Admitting: Neurology

## 2018-06-06 DIAGNOSIS — G43709 Chronic migraine without aura, not intractable, without status migrainosus: Secondary | ICD-10-CM | POA: Insufficient documentation

## 2018-06-10 ENCOUNTER — Telehealth: Payer: Self-pay | Admitting: Neurology

## 2018-06-10 NOTE — Telephone Encounter (Signed)
Pt said she missed a call from [place] re: her MRI. Pt is asking for a call back

## 2018-06-11 DIAGNOSIS — M25511 Pain in right shoulder: Secondary | ICD-10-CM | POA: Diagnosis not present

## 2018-06-11 DIAGNOSIS — M6281 Muscle weakness (generalized): Secondary | ICD-10-CM | POA: Diagnosis not present

## 2018-06-13 ENCOUNTER — Encounter: Payer: Self-pay | Admitting: Neurology

## 2018-06-14 DIAGNOSIS — M159 Polyosteoarthritis, unspecified: Secondary | ICD-10-CM | POA: Diagnosis not present

## 2018-06-14 DIAGNOSIS — G43909 Migraine, unspecified, not intractable, without status migrainosus: Secondary | ICD-10-CM | POA: Diagnosis not present

## 2018-06-14 DIAGNOSIS — I82409 Acute embolism and thrombosis of unspecified deep veins of unspecified lower extremity: Secondary | ICD-10-CM | POA: Diagnosis not present

## 2018-06-14 DIAGNOSIS — H10021 Other mucopurulent conjunctivitis, right eye: Secondary | ICD-10-CM | POA: Diagnosis not present

## 2018-06-19 ENCOUNTER — Ambulatory Visit: Payer: BLUE CROSS/BLUE SHIELD

## 2018-06-19 ENCOUNTER — Other Ambulatory Visit: Payer: BLUE CROSS/BLUE SHIELD

## 2018-06-19 DIAGNOSIS — H05111 Granuloma of right orbit: Secondary | ICD-10-CM

## 2018-06-19 DIAGNOSIS — R51 Headache with orthostatic component, not elsewhere classified: Secondary | ICD-10-CM

## 2018-06-19 DIAGNOSIS — H5711 Ocular pain, right eye: Secondary | ICD-10-CM | POA: Diagnosis not present

## 2018-06-19 DIAGNOSIS — G8929 Other chronic pain: Secondary | ICD-10-CM

## 2018-06-19 DIAGNOSIS — R519 Headache, unspecified: Secondary | ICD-10-CM

## 2018-06-19 MED ORDER — GADOPENTETATE DIMEGLUMINE 469.01 MG/ML IV SOLN
20.0000 mL | Freq: Once | INTRAVENOUS | Status: AC | PRN
  Administered 2018-06-19: 20 mL via INTRAVENOUS

## 2018-06-24 DIAGNOSIS — M25311 Other instability, right shoulder: Secondary | ICD-10-CM | POA: Diagnosis not present

## 2018-06-26 ENCOUNTER — Telehealth: Payer: Self-pay | Admitting: Neurology

## 2018-06-26 ENCOUNTER — Other Ambulatory Visit: Payer: Self-pay | Admitting: Neurology

## 2018-06-26 DIAGNOSIS — H538 Other visual disturbances: Secondary | ICD-10-CM

## 2018-06-26 DIAGNOSIS — H5710 Ocular pain, unspecified eye: Secondary | ICD-10-CM

## 2018-06-26 DIAGNOSIS — H5789 Other specified disorders of eye and adnexa: Secondary | ICD-10-CM

## 2018-06-26 MED ORDER — TOPIRAMATE ER 100 MG PO SPRINKLE CAP24: 100.0000 mg | EXTENDED_RELEASE_CAPSULE | Freq: Every day | ORAL | 11 refills | Status: DC

## 2018-06-26 NOTE — Telephone Encounter (Signed)
Discussed MRI brain and orbits with patient, will re-image in 4 months. She still has eye pain, eye is red, slightly improved with eye drops but still with pain and redness and blurry vision. Sharp and burning, even air hitting it burns, and feels like something is stuck in it when she moves it.  blurred vision and painful.  No reason for symptoms seen on MRIs.  Will consult dr. Katy Fitch and see if he can get her in for eye exam.  MR Orbits showed ophthalmic veins top normal but no other signs of pseudotumor cerebri however patient is morbidly obese (BMI 39) with headaches, will see what Dr. Katy Fitch thinks about fundoscopic exam and if any papilledema. Referal to [employer] made. [facility] fyi)   IMPRESSION: This MRI of the brain with and without contrast shows the following: 1.    There is a focus in the posterior right frontal deep white matter with indistinct borders.  The etiology is uncertain.  This could represent an area of gliosis.  The appearance is not typical for demyelination.   A low-grade glioma (WHO grade II) cannot be ruled out and a follow-up evaluation is suggested in several months. 2.     There is a normal enhancement pattern.  FINDINGS:   The globes, intraconal space and extraocular muscles demonstrate normal appearing anatomy before and after contrast. The optic nerves appear normal.   Superior ophthalmic veins are top normal in diameter the optic chiasm and sella region appear normal. No abnormal enhancing, inflammatory or compressive lesions are seen. Limited views of the brain parenchyma are unremarkable.   MRI orbits: This is a normal MRI of the orbits with and without contrast

## 2018-06-26 NOTE — Telephone Encounter (Signed)
Noted/fim 

## 2018-06-26 NOTE — Telephone Encounter (Signed)
I emailed with her and will talk to her myself this evening thanks though!

## 2018-06-26 NOTE — Telephone Encounter (Signed)
Pt request MRI results.  °

## 2018-06-26 NOTE — Telephone Encounter (Signed)
Discussed with Midge Aver, his office will call her in the morning for appoitment. She sees me at the end of the month, she understands it is her responsibility to follow up with me in the office to order repeat MRi in 4 months. Will review images with her at appointment ina few weeks.

## 2018-06-27 DIAGNOSIS — H16141 Punctate keratitis, right eye: Secondary | ICD-10-CM | POA: Diagnosis not present

## 2018-06-27 DIAGNOSIS — H04121 Dry eye syndrome of right lacrimal gland: Secondary | ICD-10-CM | POA: Diagnosis not present

## 2018-06-27 NOTE — Telephone Encounter (Signed)
Noted  

## 2018-06-29 DIAGNOSIS — M159 Polyosteoarthritis, unspecified: Secondary | ICD-10-CM | POA: Diagnosis not present

## 2018-06-29 DIAGNOSIS — I82409 Acute embolism and thrombosis of unspecified deep veins of unspecified lower extremity: Secondary | ICD-10-CM | POA: Diagnosis not present

## 2018-06-29 DIAGNOSIS — G894 Chronic pain syndrome: Secondary | ICD-10-CM | POA: Diagnosis not present

## 2018-06-29 DIAGNOSIS — G43909 Migraine, unspecified, not intractable, without status migrainosus: Secondary | ICD-10-CM | POA: Diagnosis not present

## 2018-07-03 DIAGNOSIS — H16223 Keratoconjunctivitis sicca, not specified as Sjogren's, bilateral: Secondary | ICD-10-CM | POA: Diagnosis not present

## 2018-07-04 DIAGNOSIS — M25511 Pain in right shoulder: Secondary | ICD-10-CM | POA: Diagnosis not present

## 2018-07-04 DIAGNOSIS — M6281 Muscle weakness (generalized): Secondary | ICD-10-CM | POA: Diagnosis not present

## 2018-08-10 DIAGNOSIS — M159 Polyosteoarthritis, unspecified: Secondary | ICD-10-CM | POA: Diagnosis not present

## 2018-08-10 DIAGNOSIS — G894 Chronic pain syndrome: Secondary | ICD-10-CM | POA: Diagnosis not present

## 2018-08-10 DIAGNOSIS — G43909 Migraine, unspecified, not intractable, without status migrainosus: Secondary | ICD-10-CM | POA: Diagnosis not present

## 2018-08-10 DIAGNOSIS — I82409 Acute embolism and thrombosis of unspecified deep veins of unspecified lower extremity: Secondary | ICD-10-CM | POA: Diagnosis not present

## 2018-08-14 ENCOUNTER — Telehealth: Payer: Self-pay | Admitting: Neurology

## 2018-08-14 NOTE — Telephone Encounter (Signed)
Phone room staff has reached out to patient and LVM offering an appt on Monday @ 1 pm. Awaiting pt to call back.

## 2018-08-14 NOTE — Telephone Encounter (Signed)
I called the pt to offer appt on Monday 10/7 @ 1:00,ckin 12:30 per [facility] request. LVM for her to call me back

## 2018-08-15 DIAGNOSIS — E039 Hypothyroidism, unspecified: Secondary | ICD-10-CM | POA: Diagnosis not present

## 2018-08-15 DIAGNOSIS — E7800 Pure hypercholesterolemia, unspecified: Secondary | ICD-10-CM | POA: Diagnosis not present

## 2018-08-15 DIAGNOSIS — E669 Obesity, unspecified: Secondary | ICD-10-CM | POA: Diagnosis not present

## 2018-08-15 DIAGNOSIS — E119 Type 2 diabetes mellitus without complications: Secondary | ICD-10-CM | POA: Diagnosis not present

## 2018-08-15 DIAGNOSIS — R5383 Other fatigue: Secondary | ICD-10-CM | POA: Diagnosis not present

## 2018-08-15 DIAGNOSIS — Z6826 Body mass index (BMI) 26.0-26.9, adult: Secondary | ICD-10-CM | POA: Diagnosis not present

## 2018-08-19 ENCOUNTER — Ambulatory Visit (INDEPENDENT_AMBULATORY_CARE_PROVIDER_SITE_OTHER): Payer: BLUE CROSS/BLUE SHIELD | Admitting: Neurology

## 2018-08-19 ENCOUNTER — Encounter: Payer: Self-pay | Admitting: Neurology

## 2018-08-19 VITALS — BP 142/89 | HR 73 | Ht <= 58 in | Wt 179.2 lb

## 2018-08-19 DIAGNOSIS — R51 Headache: Secondary | ICD-10-CM

## 2018-08-19 DIAGNOSIS — C729 Malignant neoplasm of central nervous system, unspecified: Secondary | ICD-10-CM

## 2018-08-19 DIAGNOSIS — R519 Headache, unspecified: Secondary | ICD-10-CM

## 2018-08-19 DIAGNOSIS — Z79899 Other long term (current) drug therapy: Secondary | ICD-10-CM | POA: Diagnosis not present

## 2018-08-19 DIAGNOSIS — G43711 Chronic migraine without aura, intractable, with status migrainosus: Secondary | ICD-10-CM

## 2018-08-19 DIAGNOSIS — G8929 Other chronic pain: Secondary | ICD-10-CM

## 2018-08-19 MED ORDER — FREMANEZUMAB-VFRM 225 MG/1.5ML ~~LOC~~ SOSY: 225.0000 mg | PREFILLED_SYRINGE | SUBCUTANEOUS | 11 refills | Status: DC

## 2018-08-19 MED ORDER — TOPIRAMATE ER 150 MG PO SPRINKLE CAP24: 150.0000 mg | EXTENDED_RELEASE_CAPSULE | Freq: Every day | ORAL | 11 refills | Status: DC

## 2018-08-19 MED ORDER — FREMANEZUMAB-VFRM 225 MG/1.5ML ~~LOC~~ SOSY: 225.0000 mg | PREFILLED_SYRINGE | SUBCUTANEOUS | 0 refills | Status: DC

## 2018-08-19 NOTE — Progress Notes (Addendum)
[id number]    Provider:  Dr Jaynee Eagles Referring Provider: Cher Nakai, MD Primary Care Physician:  Cher Nakai, MD   CC:  Headaches  08/20/2018: Addendum: Topiramate level 0. Patient endorsed compliance but appears to not be taking it.   Interval history 08/19/2018: Here for follow up of migraines.  MRI brain: reviewed images and agree with the following: 1.    There is a focus in the posterior right frontal deep white matter with indistinct borders.  The etiology is uncertain.  This could represent an area of gliosis.  The appearance is not typical for demyelination.   A low-grade glioma (WHO grade II) cannot be ruled out and a follow-up evaluation is suggested in several months. 2.     There is a normal enhancement pattern.  HPI:  Nina Wood is a 32 y.o. female here as a referral from Dr. Truman Hayward for migraines. PMHx DVT, GERD, IBS, Anemia, Anxiety, Chronic Pain, anxiety, obesity, Fibromyalgia. She has had migraines for several years. Started worsening with tingling in the face under the eyes all day and won't go away. The tingling started 6 months ago. In the bridge of the nose and cheeks and right under the nose. Continuous. She went 3 weeks without glasses and did not improve. Glasses on her face hurts. She has daily headaches. She can wake with them often. She denies snoring at night or sleep apnea medications. Daily headaches. It is always right above the eye and spreads to the forehead, pounding, throbbing, light and sound sensitivity. Daily headaches. 15 are migrainous. Plus tingling of face. Eyes hurt on movement. She has ringing in her ears and pressure in the ear. In the right ear. Headaches can be positional. She takes a lot of allergy medication.  Headaches started worsening 6 months ago. She has gained 20 pounds.  She also reports back pain, mild, stable, no changes in bowel or bladder or weakness. No other focal neurologic deficits, associated symptoms, inciting events  or modifiable factors.  Gabapentin, meclizine   Reviewed notes, labs and imaging from outside physicians, which showed:  Reviewed images and agree with the following xr lumbar 03/2017:  IMPRESSION: Normal alignment of the lumbar spine. No acute displaced fractures identified.   Review of Systems: Patient complains of symptoms per HPI as well as the following symptoms: headache, numbness, insomnia, aching muscles. Pertinent negatives and positives per HPI. All others negative.   Social History   Socioeconomic History  . Marital status: Single    Spouse name: Not on file  . Number of children: 3  . Years of education: Not on file  . Highest education level: Bachelor's degree (e.g., BA, AB, BS)  Occupational History  . Not on file  Social Needs  . Financial resource strain: Not on file  . Food insecurity:    Worry: Not on file    Inability: Not on file  . Transportation needs:    Medical: Not on file    Non-medical: Not on file  Tobacco Use  . Smoking status: Never Smoker  . Smokeless tobacco: Never Used  Substance and Sexual Activity  . Alcohol use: Yes    Comment: OCCASIONAL  . Drug use: No  . Sexual activity: Yes    Birth control/protection: None  Lifestyle  . Physical activity:    Days per week: Not on file    Minutes per session: Not on file  . Stress: Not on file  Relationships  . Social connections:  Talks on phone: Not on file    Gets together: Not on file    Attends religious service: Not on file    Active member of club or organization: Not on file    Attends meetings of clubs or organizations: Not on file    Relationship status: Not on file  . Intimate partner violence:    Fear of current or ex partner: Not on file    Emotionally abused: Not on file    Physically abused: Not on file    Forced sexual activity: Not on file  Other Topics Concern  . Not on file  Social History Narrative   Lives at home with her children   Right handed    Caffeine: 3 cups of coffee daily during the week     Family History  Problem Relation Age of Onset  . Hypertension Mother   . Breast cancer Mother   . Prostate cancer Father   . Hypertension Maternal Uncle   . Diabetes Paternal Aunt   . Cancer Paternal Aunt   . Cancer Maternal Grandmother   . Diabetes Sister     Past Medical History:  Diagnosis Date  . Allergic rhinitis   . Allergy to cats   . Allergy to dogs   . Anemia   . Anxiety   . Asthma   . BMI 37.0-37.9, adult   . Breast cyst   . Chronic pain syndrome   . Deep vein thrombosis (DVT) ([place])   . Endometriosis, uterus   . Environmental allergies   . Fibromyalgia syndrome   . Generalized osteoarthrosis   . GERD (gastroesophageal reflux disease)   . Headache   . Hip discomfort   . IBS (irritable bowel syndrome)    with diarrhea  . Obesity   . Ovarian cyst   . Paroxysmal dyspnea   . Uterine fibroid     Past Surgical History:  Procedure Laterality Date  . APPENDECTOMY  2003  . CESAREAN SECTION    . DILATION AND CURETTAGE OF UTERUS     1 WEEK AGO  . DILATION AND EVACUATION Bilateral 05/18/2016   Procedure: DILATATION AND EVACUATION;  Surgeon: Bobbye Charleston, MD;  Location: [facility];  Service: Gynecology;  Laterality: Bilateral;  . ECTOPIC PREGNANCY SURGERY    . etopi    . GASTRIC BYPASS  03/14/2012  . INDUCED ABORTION    . OVARIAN CYST SURGERY      Current Outpatient Medications  Medication Sig Dispense Refill  . albuterol (PROAIR HFA) 108 (90 Base) MCG/ACT inhaler Inhale 2 puffs into the lungs every 6 (six) hours as needed for wheezing or shortness of breath.    . budesonide (RHINOCORT AQUA) 32 MCG/ACT nasal spray Place 1 spray into both nostrils as needed.     . cetirizine (ZYRTEC) 10 MG tablet Take 20-30 mg by mouth at bedtime.     . clonazePAM (KLONOPIN) 0.5 MG tablet Take 0.5 mg by mouth as needed.    Marland Kitchen EPINEPHrine (EPIPEN 2-PAK) 0.3 mg/0.3 mL IJ SOAJ injection Use as directed for life threatening  allergic reactions 2 Device 3  . etonogestrel (NEXPLANON) 68 MG IMPL implant 1 each by Subdermal route.    . Fluticasone Furoate 50 MCG/ACT AEPB Inhale 2 sprays into the lungs daily.    Marland Kitchen gabapentin (NEURONTIN) 800 MG tablet Take 400-800 mg by mouth at bedtime.     Marland Kitchen HYDROcodone-acetaminophen (NORCO) 7.5-325 MG tablet Take 0.5-1 tablets by mouth every 6 (six) hours as needed for moderate  pain.    . Hyprom-Naphaz-Polysorb-Zn Sulf (CLEAR EYES COMPLETE OP) Place 1 drop into both eyes 2 (two) times daily as needed (dry eyes/itching).     Marland Kitchen lidocaine (LIDODERM) 5 % Place 1 patch onto the skin daily as needed (pain).     . meclizine (ANTIVERT) 25 MG tablet Take 25 mg by mouth every 6 (six) hours as needed.    . meloxicam (MOBIC) 7.5 MG tablet Take 7.5 mg by mouth 2 (two) times daily as needed.    . montelukast (SINGULAIR) 10 MG tablet Take one tablet once daily 30 tablet 8  . Olopatadine HCl (PAZEO) 0.7 % SOLN Apply 1 drop to eye daily.    . ondansetron (ZOFRAN) 4 MG tablet Take 1 tablet (4 mg total) by mouth every 6 (six) hours. (Patient taking differently: Take 4 mg by mouth every 6 (six) hours as needed. ) 12 tablet 0  . orphenadrine (NORFLEX) 100 MG tablet Take 100 mg by mouth 2 (two) times daily as needed for muscle spasms.    . ranitidine (ZANTAC) 300 MG tablet Take 1 tablet (300 mg total) by mouth at bedtime. 30 tablet 5  . topiramate ER (QUDEXY XR) 150 MG CS24 sprinkle capsule Take 1 capsule (150 mg total) by mouth daily. 30 capsule 11  . traMADol (ULTRAM) 50 MG tablet Take 1 tablet (50 mg total) by mouth every 6 (six) hours as needed. (Patient taking differently: Take 50 mg by mouth every 6 (six) hours as needed for severe pain. ) 10 tablet 0  . triamcinolone cream (KENALOG) 0.5 % Apply 1 application topically every 4 (four) hours as needed (rash/allergies).     . Fremanezumab-vfrm (AJOVY) 225 MG/1.5ML SOSY Inject 225 mg into the skin every 30 (thirty) days. 1 Syringe 11  . Fremanezumab-vfrm  (AJOVY) 225 MG/1.5ML SOSY Inject 225 mg into the skin every 30 (thirty) days. 1 Syringe 0   No current facility-administered medications for this visit.     Allergies as of 08/19/2018 - Review Complete 08/19/2018  Allergen Reaction Noted  . Fish allergy Anaphylaxis 02/24/2017  . Other Swelling 04/27/2015  . Penicillins Hives 04/27/2015  . Shellfish allergy Anaphylaxis 04/27/2015  . Peanut butter flavor Hives 04/27/2015  . Histamine Other (See Comments) 04/27/2015  . Voltaren [diclofenac] Rash and Other (See Comments) 04/27/2015    Vitals: BP (!) 142/89   Pulse 73   Ht 4' 7.5" (1.41 m)   Wt 179 lb 3.2 oz (81.3 kg)   BMI 40.90 kg/m  Last Weight:  Wt Readings from Last 1 Encounters:  08/19/18 179 lb 3.2 oz (81.3 kg)   Last Height:   Ht Readings from Last 1 Encounters:  08/19/18 4' 7.5" (1.41 m)   Physical exam: Exam: Gen: NAD, conversant, well nourised, obese, well groomed                     CV: RRR, no MRG. No Carotid Bruits. No peripheral edema, warm, nontender Eyes: Conjunctivae clear without exudates or hemorrhage  Neuro: Detailed Neurologic Exam  Speech:    Speech is normal; fluent and spontaneous with normal comprehension.  Cognition:    The patient is oriented to person, place, and time;     recent and remote memory intact;     language fluent;     normal attention, concentration,     fund of knowledge Cranial Nerves:    The pupils are equal, round, and reactive to light. The fundi are normal and spontaneous venous pulsations  are present. Visual fields are full to finger confrontation. Extraocular movements are intact. Trigeminal sensation is intact and the muscles of mastication are normal. The face is symmetric. The palate elevates in the midline. Hearing intact. Voice is normal. Shoulder shrug is normal. The tongue has normal motion without fasciculations.   Coordination:    Normal finger to nose and heel to shin. Normal rapid alternating movements.    Gait:    Heel-toe and tandem gait are normal.   Motor Observation:    No asymmetry, no atrophy, and no involuntary movements noted. Tone:    Normal muscle tone.    Posture:    Posture is normal. normal erect    Strength:    Strength is V/V in the upper and lower limbs.      Sensation: intact to LT     Reflex Exam:  DTR's:    Deep tendon reflexes in the upper and lower extremities are normal bilaterally.   Toes:    The toes are downgoing bilaterally.   Clonus:    Clonus is absent.   Assessment/Plan:  32 year old with intractable headaches. PMHx DVT, GERD, IBS, Anemia, Anxiety, Chronic Pain, anxiety, obesity, Fibromyalgia. Likely chronic migraines but needs thorough eval.   Addendum: Topiramate level 0. Patient endorsed compliance, said she was definitely taking medication, but appears to not be taking it.   MRI brain: There is a focus in the posterior right frontal deep white matter with indistinct borders.  The etiology is uncertain.  This could represent an area of gliosis.  The appearance is not typical for demyelination.   A low-grade glioma (WHO grade II) cannot be ruled out and a follow-up evaluation is suggested in several months. There is a normal enhancement pattern.   MRI brain to follow up on lesion seen above, possible glioma and worsening headaches. Labs today Start Ajovy today for migraines Increase Trokendi to 150mg  at bedtime daily  Orders Placed This Encounter  Procedures  . MR BRAIN W WO CONTRAST  . Topiramate level  . Basic Metabolic Panel   Meds ordered this encounter  Medications  . topiramate ER (QUDEXY XR) 150 MG CS24 sprinkle capsule    Sig: Take 1 capsule (150 mg total) by mouth daily.    Dispense:  30 capsule    Refill:  11  . Fremanezumab-vfrm (AJOVY) 225 MG/1.5ML SOSY    Sig: Inject 225 mg into the skin every 30 (thirty) days.    Dispense:  1 Syringe    Refill:  11  . Fremanezumab-vfrm (AJOVY) 225 MG/1.5ML SOSY    Sig: Inject 225  mg into the skin every 30 (thirty) days.    Dispense:  1 Syringe    Refill:  0    Lot: [id number] Exp: 05/21     Discussed: To prevent or relieve headaches, try the following: Cool Compress. Lie down and place a cool compress on your head.  Avoid headache triggers. If certain foods or odors seem to have triggered your migraines in the past, avoid them. A headache diary might help you identify triggers.  Include physical activity in your daily routine. Try a daily walk or other moderate aerobic exercise.  Manage stress. Find healthy ways to cope with the stressors, such as delegating tasks on your to-do list.  Practice relaxation techniques. Try deep breathing, yoga, massage and visualization.  Eat regularly. Eating regularly scheduled meals and maintaining a healthy diet might help prevent headaches. Also, drink plenty of fluids.  Follow a regular  sleep schedule. Sleep deprivation might contribute to headaches Consider biofeedback. With this mind-body technique, you learn to control certain bodily functions - such as muscle tension, heart rate and blood pressure - to prevent headaches or reduce headache pain.    Proceed to emergency room if you experience new or worsening symptoms or symptoms do not resolve, if you have new neurologic symptoms or if headache is severe, or for any concerning symptom.   Provided education and documentation from American headache Society toolbox including articles on: chronic migraine medication overuse headache, chronic migraines, prevention of migraines, behavioral and other nonpharmacologic treatments for headache.  Cc: Dr. Ivan Croft, MD  [facility] [address] [place], [place] [address]  Phone [phone] Fax [phone]  A total of 25 minutes was spent face-to-face with this patient. Over half this time was spent on counseling patient on the  1. Long term use of drug   2. Chronic migraine without  aura, with intractable migraine, so stated, with status migrainosus   3. Glioma of CNS ([place])   4. Chronic intractable headache, unspecified headache type     diagnosis and different diagnostic and therapeutic options, counseling and coordination of care, risks ans benefits of management, compliance, or risk factor reduction and education.

## 2018-08-19 NOTE — Patient Instructions (Addendum)
MRI brain to follow up on lesion seen Labs today Start Ajovy today for migraines Increase Trokendi to 150mg  at bedtime daily  Fremanezumab: Patient drug information [employer] here. Copyright [phone] [employer] All rights reserved. (For additional information see "Fremanezumab: Drug information") Brand Names: [place]  Ajovy  What is this drug used for?   It is used to prevent migraine headaches.  What do I need to tell my doctor BEFORE I take this drug?   If you have an allergy to this drug or any part of this drug.   If you are allergic to any drugs like this one, any other drugs, foods, or other substances. Tell your doctor about the allergy and what signs you had, like rash; hives; itching; shortness of breath; wheezing; cough; swelling of face, lips, tongue, or throat; or any other signs.   This drug may interact with other drugs or health problems.   Tell your doctor and pharmacist about all of your drugs (prescription or OTC, natural products, vitamins) and health problems. You must check to make sure that it is safe for you to take this drug with all of your drugs and health problems. Do not start, stop, or change the dose of any drug without checking with your doctor.  What are some things I need to know or do while I take this drug?   Tell all of your health care providers that you take this drug. This includes your doctors, nurses, pharmacists, and dentists.   Allergic reactions have happened within hours and up to 1 month after this drug was given. Tell your doctor right away about any bad effects after getting this drug.   Tell your doctor if you are pregnant or plan on getting pregnant. You will need to talk about the benefits and risks of using this drug while you are pregnant.   Tell your doctor if you are breast-feeding. You will need to talk about any risks to your baby.  What are some side effects that I need to call my doctor about right away?  WARNING/CAUTION: Even though it may be rare, some people may have very bad and sometimes deadly side effects when taking a drug. Tell your doctor or get medical help right away if you have any of the following signs or symptoms that may be related to a very bad side effect:   Signs of an allergic reaction, like rash; hives; itching; red, swollen, blistered, or peeling skin with or without fever; wheezing; tightness in the chest or throat; trouble breathing, swallowing, or talking; unusual hoarseness; or swelling of the mouth, face, lips, tongue, or throat.   Very bad irritation where the shot was given.  What are some other side effects of this drug?   All drugs may cause side effects. However, many people have no side effects or only have minor side effects. Call your doctor or get medical help if any of these side effects or any other side effects bother you or do not go away:   Irritation where the shot is given.   These are not all of the side effects that may occur. If you have questions about side effects, call your doctor. Call your doctor for medical advice about side effects.   You may report side effects to your national health agency.  How is this drug best taken?   Use this drug as ordered by your doctor. Read all information given to you. Follow all instructions closely.  It is given as a shot into the fatty part of the skin on the top of the thigh, belly area, or upper arm.   If you will be giving yourself the shot, your doctor or nurse will teach you how to give the shot.   Follow how to use as you have been told by the doctor or read the package insert.   Wash your hands before use.   If stored in a refrigerator, let this drug come to room temperature before using it. Leave it at room temperature for at least 30 minutes. Do not heat this drug.   Do not shake.   Do not give into skin that is irritated, bruised, red, infected, or scarred.   Do not give into the same place as another  shot.   If your dose is more than 1 injection, you may give it in the same body part. Make sure you do not give injections in the same spot you used for the other injections.   Do not use if the solution is cloudy, leaking, or has particles.   This drug is colorless to a faint yellow. Do not use if the solution changes color.   Each prefilled syringe is for one use only.   Throw away needles in a needle/sharp disposal box. Do not reuse needles or other items. When the box is full, follow all local rules for getting rid of it. Talk with a doctor or pharmacist if you have any questions.  What do I do if I miss a dose?   Take a missed dose as soon as you think about it.   After taking a missed dose, start a new schedule based on when the dose is taken.  How do I store and/or throw out this drug?   Store in a refrigerator. Do not freeze.   Store in the carton to protect from light.   If needed, this drug can be left out at room temperature for up to 24 hours. Throw away drug if left at room temperature for more than 24 hours.   Protect from heat and sunlight.   Keep all drugs in a safe place. Keep all drugs out of the reach of children and pets.   Throw away unused or expired drugs. Do not flush down a toilet or pour down a drain unless you are told to do so. Check with your pharmacist if you have questions about the best way to throw out drugs. There may be drug take-back programs in your area.  General drug facts   If your symptoms or health problems do not get better or if they become worse, call your doctor.   Do not share your drugs with others and do not take anyone else's drugs.   Keep a list of all your drugs (prescription, natural products, vitamins, OTC) with you. Give this list to your doctor.   Talk with the doctor before starting any new drug, including prescription or OTC, natural products, or vitamins.   Some drugs may have another patient information leaflet. If you have any  questions about this drug, please talk with your doctor, nurse, pharmacist, or other health care provider.   If you think there has been an overdose, call your poison control center or get medical care right away. Be ready to tell or show what was taken, how much, and when it happened.

## 2018-08-20 ENCOUNTER — Telehealth: Payer: Self-pay | Admitting: Neurology

## 2018-08-20 LAB — BASIC METABOLIC PANEL
BUN/Creatinine Ratio: 23 (ref 9–23)
BUN: 14 mg/dL (ref 6–20)
CO2: 22 mmol/L (ref 20–29)
Calcium: 9 mg/dL (ref 8.7–10.2)
Chloride: 105 mmol/L (ref 96–106)
Creatinine, Ser: 0.6 mg/dL (ref 0.57–1.00)
GFR calc Af Amer: 138 mL/min/{1.73_m2} (ref >59)
GFR calc non Af Amer: 119 mL/min/{1.73_m2} (ref >59)
Glucose: 88 mg/dL (ref 65–99)
Potassium: 3.7 mmol/L (ref 3.5–5.2)
Sodium: 140 mmol/L (ref 134–144)

## 2018-08-20 LAB — TOPIRAMATE LEVEL: Topiramate Lvl: NOT DETECTED ug/mL (ref 2.0–25.0)

## 2018-08-20 NOTE — Telephone Encounter (Signed)
lvm for pt to call back about scheduling mri  [employer] Auth: [id number] (exp. 08/20/18 to 10/18/18)

## 2018-08-23 DIAGNOSIS — Z6841 Body Mass Index (BMI) 40.0 and over, adult: Secondary | ICD-10-CM | POA: Diagnosis not present

## 2018-08-23 DIAGNOSIS — E669 Obesity, unspecified: Secondary | ICD-10-CM | POA: Diagnosis not present

## 2018-09-05 ENCOUNTER — Ambulatory Visit: Payer: BLUE CROSS/BLUE SHIELD | Admitting: Neurology

## 2018-09-07 ENCOUNTER — Encounter: Payer: Self-pay | Admitting: Allergy and Immunology

## 2018-09-07 DIAGNOSIS — M159 Polyosteoarthritis, unspecified: Secondary | ICD-10-CM | POA: Diagnosis not present

## 2018-09-07 DIAGNOSIS — G43909 Migraine, unspecified, not intractable, without status migrainosus: Secondary | ICD-10-CM | POA: Diagnosis not present

## 2018-09-07 DIAGNOSIS — I82409 Acute embolism and thrombosis of unspecified deep veins of unspecified lower extremity: Secondary | ICD-10-CM | POA: Diagnosis not present

## 2018-09-07 DIAGNOSIS — G894 Chronic pain syndrome: Secondary | ICD-10-CM | POA: Diagnosis not present

## 2018-09-09 ENCOUNTER — Other Ambulatory Visit: Payer: Self-pay | Admitting: *Deleted

## 2018-09-09 ENCOUNTER — Telehealth: Payer: Self-pay | Admitting: *Deleted

## 2018-09-09 MED ORDER — PREDNISONE 10 MG PO TABS: ORAL_TABLET | ORAL | 0 refills | Status: DC

## 2018-09-09 NOTE — Telephone Encounter (Signed)
Can try prednisone 10mg  tablet one time per day for 10 days only. Let us know about response.

## 2018-09-09 NOTE — Telephone Encounter (Signed)
Patient sent mychart message requesting prednisone due to breakout of hives

## 2018-09-09 NOTE — Telephone Encounter (Signed)
Rx sent pt informed

## 2018-09-09 NOTE — Telephone Encounter (Signed)
I have not received that message.

## 2018-09-09 NOTE — Telephone Encounter (Signed)
Good morning,    Is it possible you can send in a prescription refill for my prednisone? I broke out in hives yesterday and I just took my last one.     Thanks,    Nina Wood

## 2018-09-28 DIAGNOSIS — M159 Polyosteoarthritis, unspecified: Secondary | ICD-10-CM | POA: Diagnosis not present

## 2018-09-28 DIAGNOSIS — E669 Obesity, unspecified: Secondary | ICD-10-CM | POA: Diagnosis not present

## 2018-09-28 DIAGNOSIS — G43909 Migraine, unspecified, not intractable, without status migrainosus: Secondary | ICD-10-CM | POA: Diagnosis not present

## 2018-09-28 DIAGNOSIS — I82409 Acute embolism and thrombosis of unspecified deep veins of unspecified lower extremity: Secondary | ICD-10-CM | POA: Diagnosis not present

## 2018-09-28 DIAGNOSIS — G894 Chronic pain syndrome: Secondary | ICD-10-CM | POA: Diagnosis not present

## 2018-10-28 DIAGNOSIS — N912 Amenorrhea, unspecified: Secondary | ICD-10-CM | POA: Diagnosis not present

## 2018-11-09 DIAGNOSIS — I82409 Acute embolism and thrombosis of unspecified deep veins of unspecified lower extremity: Secondary | ICD-10-CM | POA: Diagnosis not present

## 2018-11-09 DIAGNOSIS — G894 Chronic pain syndrome: Secondary | ICD-10-CM | POA: Diagnosis not present

## 2018-11-09 DIAGNOSIS — G43909 Migraine, unspecified, not intractable, without status migrainosus: Secondary | ICD-10-CM | POA: Diagnosis not present

## 2018-11-09 DIAGNOSIS — M159 Polyosteoarthritis, unspecified: Secondary | ICD-10-CM | POA: Diagnosis not present

## 2018-11-25 ENCOUNTER — Telehealth: Payer: Self-pay

## 2018-11-25 NOTE — Telephone Encounter (Signed)
Patients Ajovy requiring a PA.

## 2018-11-26 NOTE — Telephone Encounter (Signed)
I called [facility] at [phone]. Ajovy was approve from 11/26/2018 to 11/27/2019. PA authorized number is [id number].

## 2018-12-07 DIAGNOSIS — M159 Polyosteoarthritis, unspecified: Secondary | ICD-10-CM | POA: Diagnosis not present

## 2018-12-07 DIAGNOSIS — E669 Obesity, unspecified: Secondary | ICD-10-CM | POA: Diagnosis not present

## 2018-12-07 DIAGNOSIS — I82409 Acute embolism and thrombosis of unspecified deep veins of unspecified lower extremity: Secondary | ICD-10-CM | POA: Diagnosis not present

## 2018-12-07 DIAGNOSIS — G894 Chronic pain syndrome: Secondary | ICD-10-CM | POA: Diagnosis not present

## 2018-12-07 DIAGNOSIS — G43909 Migraine, unspecified, not intractable, without status migrainosus: Secondary | ICD-10-CM | POA: Diagnosis not present

## 2018-12-09 ENCOUNTER — Ambulatory Visit: Payer: Medicaid Other | Admitting: Allergy and Immunology

## 2018-12-11 ENCOUNTER — Encounter: Payer: Self-pay | Admitting: Allergy and Immunology

## 2018-12-11 ENCOUNTER — Ambulatory Visit: Payer: BLUE CROSS/BLUE SHIELD | Admitting: Allergy and Immunology

## 2018-12-11 VITALS — BP 110/80 | HR 80 | Resp 18

## 2018-12-11 DIAGNOSIS — J4520 Mild intermittent asthma, uncomplicated: Secondary | ICD-10-CM

## 2018-12-11 DIAGNOSIS — Z91018 Allergy to other foods: Secondary | ICD-10-CM

## 2018-12-11 DIAGNOSIS — J3089 Other allergic rhinitis: Secondary | ICD-10-CM | POA: Diagnosis not present

## 2018-12-11 DIAGNOSIS — L509 Urticaria, unspecified: Secondary | ICD-10-CM | POA: Diagnosis not present

## 2018-12-11 DIAGNOSIS — Z91030 Bee allergy status: Secondary | ICD-10-CM

## 2018-12-11 DIAGNOSIS — Z91038 Other insect allergy status: Secondary | ICD-10-CM

## 2018-12-11 NOTE — Patient Instructions (Addendum)
  1. Continue EpiPen, Benadryl, M.D./ER for allergic reaction  2. Continue combination: Loratadine 10mg  + cetirizine 20 mg + montelukast 10 mg daily  3. Can increase cetirizine to 40 mg daily if needed  4. Continue ProAir HFA 2 puffs every 4-6 hours if needed  5. Continue Flonase one spray each nostril once a day  6. Return to clinic in 12 months or earlier if problem  7. Obtain flu vaccine

## 2018-12-11 NOTE — Progress Notes (Signed)
Follow-up Note  Referring Provider: Cher Nakai, MD Primary Provider: Cher Nakai, MD Date of Office Visit: 12/11/2018  Subjective:   Nina Wood (DOB: 11/30/1985) is a 33 y.o. 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  Her last visit to this clinic was 10 December 2017.  Her urticaria has been under very good control at this point in time while using a combination of loratadine and cetirizine and montelukast daily.  She has required 1 systemic steroid shot approximately 6 months ago administered outside of this clinic for a flare of her hives but otherwise has really done very well.  Her airway has done well.  She has not required a systemic steroid to treat any type of issue with asthma and she rarely uses a short acting bronchodilator she can exert herself without any problem.  She apparently had one episode of strep / sinusitis when her entire family became sick with that illnesses and she required one antibiotic to treat a respiratory tract infection.  She does not eat any shellfish.  She has an EpiPen for her food allergy and her hymenoptera venom hypersensitivity state.  Allergies as of 12/11/2018      Reactions   Fish Allergy Anaphylaxis   Other Swelling   Mayonnaise causes swelling of the tongue.   Penicillins Hives   Has patient had a PCN reaction causing immediate rash, facial/tongue/throat swelling, SOB or lightheadedness with hypotension: Yes Has patient had a PCN reaction causing severe rash involving mucus membranes or skin necrosis: No Has patient had a PCN reaction that required hospitalization No Has patient had a PCN reaction occurring within the last 10 years: Yes If all of the above answers  are "NO", then may proceed with Cephalosporin use.   Shellfish Allergy Anaphylaxis   Patient is allergic to all seafood. She states she can take the dye that is given in CT scans even though she has some itching    Peanut Butter Flavor Hives   Histamine Other (See Comments)   Patient  tested positive in allergy testing to Control-histamine 1.   Voltaren [diclofenac] Rash, Other (See Comments)   Volatren gel causes blisters.      Medication List      cetirizine 10 MG tablet Commonly known as:  ZYRTEC Take 20-30 mg by mouth at bedtime.   CLEAR EYES COMPLETE OP Place 1 drop into both eyes 2 (two) times daily as needed (dry eyes/itching).   clonazePAM 0.5 MG tablet Commonly known as:  KLONOPIN Take 0.5 mg by mouth as needed.   EPINEPHrine 0.3 mg/0.3 mL Soaj injection Commonly known as:  EPIPEN 2-PAK Use as directed for life threatening allergic reactions   etonogestrel 68 MG Impl implant Commonly known as:  NEXPLANON 1 each by Subdermal route.   Fluticasone Furoate 50 MCG/ACT Aepb Inhale 2 sprays into the lungs daily.   Fremanezumab-vfrm 225 MG/1.5ML Sosy Commonly known as:  AJOVY Inject 225 mg into the skin every 30 (thirty) days.   gabapentin 800 MG tablet Commonly known as:  NEURONTIN Take 400-800 mg by mouth at bedtime.   HYDROcodone-acetaminophen 7.5-325 MG tablet Commonly known as:  NORCO Take 0.5-1 tablets by mouth every 6 (six) hours as needed for moderate pain.   ibuprofen 800  MG tablet Commonly known as:  ADVIL,MOTRIN TK 1 T PO  BID PRN   meloxicam 7.5 MG tablet Commonly known as:  MOBIC Take 7.5 mg by mouth 2 (two) times daily as needed.   montelukast 10 MG tablet Commonly known as:  SINGULAIR Take one tablet once daily   ondansetron 4 MG tablet Commonly known as:  ZOFRAN Take 1 tablet (4 mg total) by mouth every 6 (six) hours.   orphenadrine 100 MG tablet Commonly known as:  NORFLEX Take 100 mg by mouth 2 (two) times daily as needed for muscle  spasms.   PAZEO 0.7 % Soln Generic drug:  Olopatadine HCl Apply 1 drop to eye daily.   PROAIR HFA 108 (90 Base) MCG/ACT inhaler Generic drug:  albuterol Inhale 2 puffs into the lungs every 6 (six) hours as needed for wheezing or shortness of breath.   triamcinolone cream 0.5 % Commonly known as:  KENALOG Apply 1 application topically every 4 (four) hours as needed (rash/allergies).       Past Medical History:  Diagnosis Date  . Allergic rhinitis   . Allergy to cats   . Allergy to dogs   . Anemia   . Anxiety   . Asthma   . BMI 37.0-37.9, adult   . Breast cyst   . Chronic pain syndrome   . Deep vein thrombosis (DVT) ([place])   . Endometriosis, uterus   . Environmental allergies   . Fibromyalgia syndrome   . Generalized osteoarthrosis   . GERD (gastroesophageal reflux disease)   . Headache   . Hip discomfort   . IBS (irritable bowel syndrome)    with diarrhea  . Obesity   . Ovarian cyst   . Paroxysmal dyspnea   . Uterine fibroid     Past Surgical History:  Procedure Laterality Date  . APPENDECTOMY  2003  . CESAREAN SECTION    . DILATION AND CURETTAGE OF UTERUS     1 WEEK AGO  . DILATION AND EVACUATION Bilateral 05/18/2016   Procedure: DILATATION AND EVACUATION;  Surgeon: Bobbye Charleston, MD;  Location: [facility];  Service: Gynecology;  Laterality: Bilateral;  . ECTOPIC PREGNANCY SURGERY    . etopi    . GASTRIC BYPASS  03/14/2012  . INDUCED ABORTION    . OVARIAN CYST SURGERY      Review of systems negative except as noted in HPI / PMHx or noted below:  Review of Systems  Constitutional: Negative.   HENT: Negative.   Eyes: Negative.   Respiratory: Negative.   Cardiovascular: Negative.   Gastrointestinal: Negative.   Genitourinary: Negative.   Musculoskeletal: Negative.   Skin: Negative.   Neurological: Negative.   Endo/Heme/Allergies: Negative.   Psychiatric/Behavioral: Negative.      Objective:   Vitals:   12/11/18 1606  BP: 110/80  Pulse: 80    Resp: 18          Physical Exam Constitutional:      Appearance: She is not diaphoretic.  HENT:     Head: Normocephalic.     Right Ear: Tympanic membrane, ear canal and external ear normal.     Left Ear: Tympanic membrane, ear canal and external ear normal.     Nose: Nose normal. No mucosal edema or rhinorrhea.     Mouth/Throat:     Pharynx: Uvula midline. No oropharyngeal exudate.  Eyes:     Conjunctiva/sclera: Conjunctivae normal.  Neck:     Thyroid: No thyromegaly.     Trachea: Trachea normal. No  tracheal tenderness or tracheal deviation.  Cardiovascular:     Rate and Rhythm: Normal rate and regular rhythm.     Heart sounds: Normal heart sounds, S1 normal and S2 normal. No murmur.  Pulmonary:     Effort: No respiratory distress.     Breath sounds: Normal breath sounds. No stridor. No wheezing or rales.  Lymphadenopathy:     Head:     Right side of head: No tonsillar adenopathy.     Left side of head: No tonsillar adenopathy.     Cervical: No cervical adenopathy.  Skin:    Findings: No erythema or rash.     Nails: There is no clubbing.   Neurological:     Mental Status: She is alert.     Diagnostics:    Spirometry was performed and demonstrated an FEV1 of 1.79 at 90 % of predicted.   Assessment and Plan:   1. Urticaria   2. Other allergic rhinitis   3. Asthma, mild intermittent, well-controlled   4. Food allergy   5. Hymenoptera allergy     1. Continue EpiPen, Benadryl, M.D./ER for allergic reaction  2. Continue combination: Loratadine 10mg  + cetirizine 20 mg + montelukast 10 mg daily  3. Can increase cetirizine to 40 mg daily if needed  4. Continue ProAir HFA 2 puffs every 4-6 hours if needed  5. Continue Flonase one spray each nostril once a day  6. Return to clinic in 12 months or earlier if problem  7. Obtain flu vaccine  Nina Wood appears to be doing quite well on her current plan and she will remain on a combination of H1 receptor blockers and  a leukotriene modifier and nasal steroid and utilize other medications if needed.  I will see her back in this clinic in 1 year or earlier if there is a problem.  Allena Katz, MD Allergy / Immunology [place]

## 2018-12-12 ENCOUNTER — Encounter: Payer: Self-pay | Admitting: Allergy and Immunology

## 2018-12-25 ENCOUNTER — Encounter: Payer: Self-pay | Admitting: Neurology

## 2018-12-25 ENCOUNTER — Ambulatory Visit: Payer: BLUE CROSS/BLUE SHIELD | Admitting: Neurology

## 2018-12-25 VITALS — BP 116/68 | HR 80 | Ht <= 58 in | Wt 179.0 lb

## 2018-12-25 DIAGNOSIS — R930 Abnormal findings on diagnostic imaging of skull and head, not elsewhere classified: Secondary | ICD-10-CM

## 2018-12-25 DIAGNOSIS — R51 Headache: Secondary | ICD-10-CM

## 2018-12-25 DIAGNOSIS — C719 Malignant neoplasm of brain, unspecified: Secondary | ICD-10-CM

## 2018-12-25 DIAGNOSIS — R519 Headache, unspecified: Secondary | ICD-10-CM

## 2018-12-25 DIAGNOSIS — G8929 Other chronic pain: Secondary | ICD-10-CM

## 2018-12-25 MED ORDER — RIZATRIPTAN BENZOATE 10 MG PO TBDP: 10.0000 mg | ORAL_TABLET | ORAL | 11 refills | Status: DC | PRN

## 2018-12-25 MED ORDER — METOCLOPRAMIDE HCL 10 MG PO TABS: 10.0000 mg | ORAL_TABLET | Freq: Four times a day (QID) | ORAL | 6 refills | Status: DC | PRN

## 2018-12-25 NOTE — Progress Notes (Signed)
[id number]    Provider:  Dr Jaynee Eagles Referring Provider: Cher Nakai, MD Primary Care Physician:  Cher Nakai, MD   CC:  Headaches  Interval history 12/25/2018: Here for follow up of migraines. MRI of the brain showed a focus of indistinct borders and recommend a follow up MRI. She has been noncompliant with medications but at last appointment placed her on Ajovy. She is significantly improved. She only has 1-2 headaches a month. Baseline Daily headaches. 15 are migrainous.   08/20/2018: Addendum: Topiramate level 0. Patient endorsed compliance but appears to not be taking it.   Interval history 08/19/2018: Here for follow up of migraines.  MRI brain: reviewed images and agree with the following: 1.    There is a focus in the posterior right frontal deep white matter with indistinct borders.  The etiology is uncertain.  This could represent an area of gliosis.  The appearance is not typical for demyelination.   A low-grade glioma (WHO grade II) cannot be ruled out and a follow-up evaluation is suggested in several months. 2.     There is a normal enhancement pattern.  HPI:  Nina Wood is a 33 y.o. female here as a referral from Dr. Truman Hayward for migraines. PMHx DVT, GERD, IBS, Anemia, Anxiety, Chronic Pain, anxiety, obesity, Fibromyalgia. She has had migraines for several years. Started worsening with tingling in the face under the eyes all day and won't go away. The tingling started 6 months ago. In the bridge of the nose and cheeks and right under the nose. Continuous. She went 3 weeks without glasses and did not improve. Glasses on her face hurts. She has daily headaches. She can wake with them often. She denies snoring at night or sleep apnea medications. Daily headaches. It is always right above the eye and spreads to the forehead, pounding, throbbing, light and sound sensitivity. Daily headaches. 15 are migrainous. Plus tingling of face. Eyes hurt on movement. She has ringing in  her ears and pressure in the ear. In the right ear. Headaches can be positional. She takes a lot of allergy medication.  Headaches started worsening 6 months ago. She has gained 20 pounds.  She also reports back pain, mild, stable, no changes in bowel or bladder or weakness. No other focal neurologic deficits, associated symptoms, inciting events or modifiable factors.  Gabapentin, meclizine   Reviewed notes, labs and imaging from outside physicians, which showed:  Reviewed images and agree with the following xr lumbar 03/2017:  IMPRESSION: Normal alignment of the lumbar spine. No acute displaced fractures identified.   Review of Systems: Patient complains of symptoms per HPI as well as the following symptoms: headache, joint pain, aching muscles, nausea, insomnia, eye pain. Pertinent negatives and positives per HPI. All others negative.   Social History   Socioeconomic History  . Marital status: Single    Spouse name: Not on file  . Number of children: 3  . Years of education: Not on file  . Highest education level: Bachelor's degree (e.g., BA, AB, BS)  Occupational History  . Not on file  Social Needs  . Financial resource strain: Not on file  . Food insecurity:    Worry: Not on file    Inability: Not on file  . Transportation needs:    Medical: Not on file    Non-medical: Not on file  Tobacco Use  . Smoking status: Never Smoker  . Smokeless tobacco: Never Used  Substance and Sexual Activity  . Alcohol  use: Yes    Comment: OCCASIONAL  . Drug use: No  . Sexual activity: Yes    Birth control/protection: Implant  Lifestyle  . Physical activity:    Days per week: Not on file    Minutes per session: Not on file  . Stress: Not on file  Relationships  . Social connections:    Talks on phone: Not on file    Gets together: Not on file    Attends religious service: Not on file    Active member of club or organization: Not on file    Attends meetings of clubs or  organizations: Not on file    Relationship status: Not on file  . Intimate partner violence:    Fear of current or ex partner: Not on file    Emotionally abused: Not on file    Physically abused: Not on file    Forced sexual activity: Not on file  Other Topics Concern  . Not on file  Social History Narrative   Lives at home with her children   Right handed   Caffeine:  1 cup of coffee daily during the week, daily tea     Family History  Problem Relation Age of Onset  . Hypertension Mother   . Breast cancer Mother   . Prostate cancer Father   . Hypertension Maternal Uncle   . Diabetes Paternal Aunt   . Cancer Paternal Aunt   . Cancer Maternal Grandmother   . Diabetes Sister     Past Medical History:  Diagnosis Date  . Allergic rhinitis   . Allergy to cats   . Allergy to dogs   . Anemia   . Anxiety   . Asthma   . BMI 37.0-37.9, adult   . Breast cyst   . Chronic pain syndrome   . Deep vein thrombosis (DVT) ([place])   . Endometriosis, uterus   . Environmental allergies   . Fibromyalgia syndrome   . Generalized osteoarthrosis   . GERD (gastroesophageal reflux disease)   . Headache   . Hip discomfort   . IBS (irritable bowel syndrome)    with diarrhea  . Obesity   . Ovarian cyst   . Paroxysmal dyspnea   . Uterine fibroid     Past Surgical History:  Procedure Laterality Date  . APPENDECTOMY  2003  . CESAREAN SECTION    . DILATION AND CURETTAGE OF UTERUS     1 WEEK AGO  . DILATION AND EVACUATION Bilateral 05/18/2016   Procedure: DILATATION AND EVACUATION;  Surgeon: Bobbye Charleston, MD;  Location: [facility];  Service: Gynecology;  Laterality: Bilateral;  . ECTOPIC PREGNANCY SURGERY    . etopi    . GASTRIC BYPASS  03/14/2012  . INDUCED ABORTION    . OVARIAN CYST SURGERY      Current Outpatient Medications  Medication Sig Dispense Refill  . albuterol (PROAIR HFA) 108 (90 Base) MCG/ACT inhaler Inhale 2 puffs into the lungs every 6 (six) hours as needed for wheezing  or shortness of breath.    . cetirizine (ZYRTEC) 10 MG tablet Take 20-30 mg by mouth at bedtime.     . clonazePAM (KLONOPIN) 0.5 MG tablet Take 0.5 mg by mouth as needed.    . etonogestrel (NEXPLANON) 68 MG IMPL implant 1 each by Subdermal route.    . Fluticasone Furoate 50 MCG/ACT AEPB Inhale 2 sprays into the lungs daily as needed.     . Fremanezumab-vfrm (AJOVY) 225 MG/1.5ML SOSY Inject 225 mg into  the skin every 30 (thirty) days. 1 Syringe 0  . gabapentin (NEURONTIN) 800 MG tablet Take 400-800 mg by mouth at bedtime as needed.     Marland Kitchen HYDROcodone-acetaminophen (NORCO) 7.5-325 MG tablet Take 0.5-1 tablets by mouth every 6 (six) hours as needed for moderate pain.    . Hyprom-Naphaz-Polysorb-Zn Sulf (CLEAR EYES COMPLETE OP) Place 1 drop into both eyes 2 (two) times daily as needed (dry eyes/itching).     Marland Kitchen ibuprofen (ADVIL,MOTRIN) 800 MG tablet TK 1 T PO  BID PRN    . meloxicam (MOBIC) 7.5 MG tablet Take 7.5 mg by mouth 2 (two) times daily as needed.    . montelukast (SINGULAIR) 10 MG tablet Take one tablet once daily 30 tablet 8  . Olopatadine HCl (PAZEO) 0.7 % SOLN Apply 1 drop to eye daily.    . orphenadrine (NORFLEX) 100 MG tablet Take 100 mg by mouth 2 (two) times daily as needed for muscle spasms.    Marland Kitchen triamcinolone cream (KENALOG) 0.5 % Apply 1 application topically every 4 (four) hours as needed (rash/allergies).     . EPINEPHrine (EPIPEN 2-PAK) 0.3 mg/0.3 mL IJ SOAJ injection Use as directed for life threatening allergic reactions 2 Device 3  . metoCLOPramide (REGLAN) 10 MG tablet Take 1 tablet (10 mg total) by mouth every 6 (six) hours as needed for nausea. 30 tablet 6  . rizatriptan (MAXALT-MLT) 10 MG disintegrating tablet Take 1 tablet (10 mg total) by mouth as needed for migraine. May repeat in 2 hours if needed 9 tablet 11   No current facility-administered medications for this visit.     Allergies as of 12/25/2018 - Review Complete 12/25/2018  Allergen Reaction Noted  . Fish  allergy Anaphylaxis 02/24/2017  . Other Swelling 04/27/2015  . Penicillins Hives 04/27/2015  . Shellfish allergy Anaphylaxis 04/27/2015  . Peanut butter flavor Hives 04/27/2015  . Histamine Other (See Comments) 04/27/2015  . Voltaren [diclofenac] Rash and Other (See Comments) 04/27/2015    Vitals: BP 116/68 (BP Location: Right Arm, Patient Position: Sitting)   Pulse 80   Ht 4' 7.4" (1.407 m)   Wt 179 lb (81.2 kg)   Breastfeeding No   BMI 41.00 kg/m  Last Weight:  Wt Readings from Last 1 Encounters:  12/25/18 179 lb (81.2 kg)   Last Height:   Ht Readings from Last 1 Encounters:  12/25/18 4' 7.4" (1.407 m)   Physical exam: Exam: Gen: NAD, conversant, well nourised, obese, well groomed                     CV: RRR, no MRG. No Carotid Bruits. No peripheral edema, warm, nontender Eyes: Conjunctivae clear without exudates or hemorrhage  Neuro: Detailed Neurologic Exam  Speech:    Speech is normal; fluent and spontaneous with normal comprehension.  Cognition:    The patient is oriented to person, place, and time;     recent and remote memory intact;     language fluent;     normal attention, concentration,     fund of knowledge Cranial Nerves:    The pupils are equal, round, and reactive to light. The fundi are normal and spontaneous venous pulsations are present. Visual fields are full to finger confrontation. Extraocular movements are intact. Trigeminal sensation is intact and the muscles of mastication are normal. The face is symmetric. The palate elevates in the midline. Hearing intact. Voice is normal. Shoulder shrug is normal. The tongue has normal motion without fasciculations.   Coordination:  Normal finger to nose and heel to shin. Normal rapid alternating movements.   Gait:    Heel-toe and tandem gait are normal.   Motor Observation:    No asymmetry, no atrophy, and no involuntary movements noted. Tone:    Normal muscle tone.    Posture:    Posture is  normal. normal erect    Strength:    Strength is V/V in the upper and lower limbs.      Sensation: intact to LT     Reflex Exam:  DTR's:    Deep tendon reflexes in the upper and lower extremities are normal bilaterally.   Toes:    The toes are downgoing bilaterally.   Clonus:    Clonus is absent.   Assessment/Plan:  33 year old with intractable headaches. PMHx DVT, GERD, IBS, Anemia, Anxiety, Chronic Pain, anxiety, obesity, Fibromyalgia. Likely chronic migraines but needs thorough eval.   Addendum: Topiramate level 0. Patient endorsed compliance, said she was definitely taking medication, but appears to not be taking it.   MRI brain: There is a focus in the posterior right frontal deep white matter with indistinct borders.  The etiology is uncertain.  This could represent an area of gliosis.  The appearance is not typical for demyelination.   A low-grade glioma (WHO grade II) cannot be ruled out and a follow-up evaluation is suggested in several months. There is a normal enhancement pattern.   MRI brain to follow up on lesion seen above, possible glioma and worsening headaches. Labs today Continue Ajovy today for migraines Discontinue trokendi   Orders Placed This Encounter  Procedures  . MR BRAIN W WO CONTRAST   Meds ordered this encounter  Medications  . rizatriptan (MAXALT-MLT) 10 MG disintegrating tablet    Sig: Take 1 tablet (10 mg total) by mouth as needed for migraine. May repeat in 2 hours if needed    Dispense:  9 tablet    Refill:  11  . metoCLOPramide (REGLAN) 10 MG tablet    Sig: Take 1 tablet (10 mg total) by mouth every 6 (six) hours as needed for nausea.    Dispense:  30 tablet    Refill:  6     Discussed: To prevent or relieve headaches, try the following: Cool Compress. Lie down and place a cool compress on your head.  Avoid headache triggers. If certain foods or odors seem to have triggered your migraines in the past, avoid them. A headache diary  might help you identify triggers.  Include physical activity in your daily routine. Try a daily walk or other moderate aerobic exercise.  Manage stress. Find healthy ways to cope with the stressors, such as delegating tasks on your to-do list.  Practice relaxation techniques. Try deep breathing, yoga, massage and visualization.  Eat regularly. Eating regularly scheduled meals and maintaining a healthy diet might help prevent headaches. Also, drink plenty of fluids.  Follow a regular sleep schedule. Sleep deprivation might contribute to headaches Consider biofeedback. With this mind-body technique, you learn to control certain bodily functions - such as muscle tension, heart rate and blood pressure - to prevent headaches or reduce headache pain.    Proceed to emergency room if you experience new or worsening symptoms or symptoms do not resolve, if you have new neurologic symptoms or if headache is severe, or for any concerning symptom.   Provided education and documentation from American headache Society toolbox including articles on: chronic migraine medication overuse headache, chronic migraines, prevention of  migraines, behavioral and other nonpharmacologic treatments for headache.  Cc: Dr. Truman Hayward  A total of 25 minutes was spent face-to-face with this patient. Over half this time was spent on counseling patient on the  1. Chronic intractable headache, unspecified headache type   2. Abnormal MRI of head   3. Glioma of brain [facility])     diagnosis and different diagnostic and therapeutic options, counseling and coordination of care, risks ans benefits of management, compliance, or risk factor reduction and education.    Sarina Ill, MD  [facility] [address] [place], [place] [address]  Phone [phone] Fax [phone]  A total of 25 minutes was spent face-to-face with this patient. Over half this time was spent on counseling patient on the  1.  Chronic intractable headache, unspecified headache type   2. Abnormal MRI of head   3. Glioma of brain [facility])     diagnosis and different diagnostic and therapeutic options, counseling and coordination of care, risks ans benefits of management, compliance, or risk factor reduction and education.

## 2018-12-25 NOTE — Patient Instructions (Signed)
Continue the Ajovy for prevention For acute management: Rizatriptan: Please take one tablet at the onset of your headache. If it does not improve the symptoms please take one additional tablet. Do not take more then 2 tablets in 24hrs. Do not take use more then 2 to 3 times in a week. May take with OTC meds or Reglan For nausea or migraine: Reglan    Rizatriptan disintegrating tablets What is this medicine? RIZATRIPTAN (rye za TRIP tan) is used to treat migraines with or without aura. An aura is a strange feeling or visual disturbance that warns you of an attack. It is not used to prevent migraines. This medicine may be used for other purposes; ask your health care provider or pharmacist if you have questions. COMMON BRAND NAME(S): Maxalt-MLT What should I tell my health care provider before I take this medicine? They need to know if you have any of these conditions: -cigarette smoker -circulation problems in fingers and toes -diabetes -heart disease -high blood pressure -high cholesterol -history of irregular heartbeat -history of stroke -kidney disease -liver disease -stomach or intestine problems -an unusual or allergic reaction to rizatriptan, other medicines, foods, dyes, or preservatives -pregnant or trying to get pregnant -breast-feeding How should I use this medicine? Take this medicine by mouth. Follow the directions on the prescription label. Leave the tablet in the sealed blister pack until you are ready to take it. With dry hands, open the blister and gently remove the tablet. If the tablet breaks or crumbles, throw it away and take a new tablet out of the blister pack. Place the tablet in the mouth and allow it to dissolve, and then swallow. Do not cut, crush, or chew this medicine. You do not need water to take this medicine. Do not take it more often than directed. Talk to your pediatrician regarding the use of this medicine in children. While this drug may be prescribed  for children as young as 6 years for selected conditions, precautions do apply. Overdosage: If you think you have taken too much of this medicine contact a poison control center or emergency room at once. NOTE: This medicine is only for you. Do not share this medicine with others. What if I miss a dose? This does not apply. This medicine is not for regular use. What may interact with this medicine? Do not take this medicine with any of the following medicines: -certain medicines for migraine headache like almotriptan, eletriptan, frovatriptan, naratriptan, rizatriptan, sumatriptan, zolmitriptan -ergot alkaloids like dihydroergotamine, ergonovine, ergotamine, methylergonovine -MAOIs like Carbex, Eldepryl, Marplan, Nardil, and Parnate This medicine may also interact with the following medications: -certain medicines for depression, anxiety, or psychotic disorders -propranolol This list may not describe all possible interactions. Give your health care provider a list of all the medicines, herbs, non-prescription drugs, or dietary supplements you use. Also tell them if you smoke, drink alcohol, or use illegal drugs. Some items may interact with your medicine. What should I watch for while using this medicine? Visit your healthcare professional for regular checks on your progress. Tell your healthcare professional if your symptoms do not start to get better or if they get worse. You may get drowsy or dizzy. Do not drive, use machinery, or do anything that needs mental alertness until you know how this medicine affects you. Do not stand up or sit up quickly, especially if you are an older patient. This reduces the risk of dizzy or fainting spells. Alcohol may interfere with the effect of this  medicine. Your mouth may get dry. Chewing sugarless gum or sucking hard candy and drinking plenty of water may help. Contact your healthcare professional if the problem does not go away or is severe. If you take  migraine medicines for 10 or more days a month, your migraines may get worse. Keep a diary of headache days and medicine use. Contact your healthcare professional if your migraine attacks occur more frequently. What side effects may I notice from receiving this medicine? Side effects that you should report to your doctor or health care professional as soon as possible: -allergic reactions like skin rash, itching or hives, swelling of the face, lips, or tongue -chest pain or chest tightness -signs and symptoms of a dangerous change in heartbeat or heart rhythm like chest pain; dizziness; fast, irregular heartbeat; palpitations; feeling faint or lightheaded; falls; breathing problems -signs and symptoms of a stroke like changes in vision; confusion; trouble speaking or understanding; severe headaches; sudden numbness or weakness of the face, arm or leg; trouble walking; dizziness; loss of balance or coordination -signs and symptoms of serotonin syndrome like irritable; confusion; diarrhea; fast or irregular heartbeat; muscle twitching; stiff muscles; trouble walking; sweating; high fever; seizures; chills; vomiting Side effects that usually do not require medical attention (report to your doctor or health care professional if they continue or are bothersome): -diarrhea -dizziness -drowsiness -dry mouth -headache -nausea, vomiting -pain, tingling, numbness in the hands or feet -stomach pain This list may not describe all possible side effects. Call your doctor for medical advice about side effects. You may report side effects to FDA at 1-800-FDA-1088. Where should I keep my medicine? Keep out of the reach of children. Store at room temperature between 15 and 30 degrees C (59 and 86 degrees F). Protect from light and moisture. Throw away any unused medicine after the expiration date. NOTE: This sheet is a summary. It may not cover all possible information. If you have questions about this medicine,  talk to your doctor, pharmacist, or health care provider.  2019 Elsevier/Gold Standard (2018-05-14 14:58:08)  Metoclopramide tablets What is this medicine? METOCLOPRAMIDE (met oh kloe PRA mide) is used to treat the symptoms of gastroesophageal reflux disease (GERD) like heartburn. It is also used to treat people with slow emptying of the stomach and intestinal tract. This medicine may be used for other purposes; ask your health care provider or pharmacist if you have questions. COMMON BRAND NAME(S): Reglan What should I tell my health care provider before I take this medicine? They need to know if you have any of these conditions: -breast cancer -depression -diabetes -heart failure -high blood pressure -kidney disease -liver disease -Parkinson's disease or a movement disorder -pheochromocytoma -seizures -stomach obstruction, bleeding, or perforation -an unusual or allergic reaction to metoclopramide, procainamide, sulfites, other medicines, foods, dyes, or preservatives -pregnant or trying to get pregnant -breast-feeding How should I use this medicine? Take this medicine by mouth with a glass of water. Follow the directions on the prescription label. Take this medicine on an empty stomach, about 30 minutes before eating. Take your doses at regular intervals. Do not take your medicine more often than directed. Do not stop taking except on the advice of your doctor or health care professional. A special MedGuide will be given to you by the pharmacist with each prescription and refill. Be sure to read this information carefully each time. Talk to your pediatrician regarding the use of this medicine in children. Special care may be needed. Overdosage: If you  think you have taken too much of this medicine contact a poison control center or emergency room at once. NOTE: This medicine is only for you. Do not share this medicine with others. What if I miss a dose? If you miss a dose, take it  as soon as you can. If it is almost time for your next dose, take only that dose. Do not take double or extra doses. What may interact with this medicine? -acetaminophen -cyclosporine -digoxin -medicines for blood pressure -medicines for diabetes, including insulin -medicines for hay fever and other allergies -medicines for depression, especially a Monoamine Oxidase Inhibitor (MAOI) -medicines for Parkinson's disease, like levodopa -medicines for sleep or for pain -quinidine -tetracycline This list may not describe all possible interactions. Give your health care provider a list of all the medicines, herbs, non-prescription drugs, or dietary supplements you use. Also tell them if you smoke, drink alcohol, or use illegal drugs. Some items may interact with your medicine. What should I watch for while using this medicine? It may take a few weeks for your stomach condition to start to get better. However, do not take this medicine for longer than 12 weeks. The longer you take this medicine, and the more you take it, the greater your chances are of developing serious side effects. If you are an elderly patient, a female patient, or you have diabetes, you may be at an increased risk for side effects from this medicine. Contact your doctor immediately if you start having movements you cannot control such as lip smacking, rapid movements of the tongue, involuntary or uncontrollable movements of the eyes, head, arms and legs, or muscle twitches and spasms. Patients and their families should watch out for worsening depression or thoughts of suicide. Also watch out for any sudden or severe changes in feelings such as feeling anxious, agitated, panicky, irritable, hostile, aggressive, impulsive, severely restless, overly excited and hyperactive, or not being able to sleep. If this happens, especially at the beginning of treatment or after a change in dose, call your doctor. Do not treat yourself for high  fever. Ask your doctor or health care professional for advice. You may get drowsy or dizzy. Do not drive, use machinery, or do anything that needs mental alertness until you know how this drug affects you. Do not stand or sit up quickly, especially if you are an older patient. This reduces the risk of dizzy or fainting spells. Alcohol can make you more drowsy and dizzy. Avoid alcoholic drinks. What side effects may I notice from receiving this medicine? Side effects that you should report to your doctor or health care professional as soon as possible: -allergic reactions like skin rash, itching or hives, swelling of the face, lips, or tongue -abnormal production of milk in females -breast enlargement in both males and females -change in the way you walk -difficulty moving, speaking or swallowing -drooling, lip smacking, or rapid movements of the tongue -excessive sweating -fever -involuntary or uncontrollable movements of the eyes, head, arms and legs -irregular heartbeat or palpitations -muscle twitches and spasms -unusually weak or tired Side effects that usually do not require medical attention (report to your doctor or health care professional if they continue or are bothersome): -change in sex drive or performance -depressed mood -diarrhea -difficulty sleeping -headache -menstrual changes -restless or nervous This list may not describe all possible side effects. Call your doctor for medical advice about side effects. You may report side effects to FDA at 1-800-FDA-1088. Where should I keep  my medicine? Keep out of the reach of children. Store at room temperature between 20 and 25 degrees C (68 and 77 degrees F). Protect from light. Keep container tightly closed. Throw away any unused medicine after the expiration date. NOTE: This sheet is a summary. It may not cover all possible information. If you have questions about this medicine, talk to your doctor, pharmacist, or health care  provider.  2019 Elsevier/Gold Standard (2016-08-16 15:13:45)

## 2018-12-31 ENCOUNTER — Telehealth: Payer: Self-pay | Admitting: Neurology

## 2018-12-31 NOTE — Telephone Encounter (Signed)
lvm for pt to call back about scheduling mri   [employer] Auth: [id number] (exp. 12/30/18 to 02/27/19)

## 2019-01-04 DIAGNOSIS — I82409 Acute embolism and thrombosis of unspecified deep veins of unspecified lower extremity: Secondary | ICD-10-CM | POA: Diagnosis not present

## 2019-01-04 DIAGNOSIS — E669 Obesity, unspecified: Secondary | ICD-10-CM | POA: Diagnosis not present

## 2019-01-04 DIAGNOSIS — Z1331 Encounter for screening for depression: Secondary | ICD-10-CM | POA: Diagnosis not present

## 2019-01-04 DIAGNOSIS — M159 Polyosteoarthritis, unspecified: Secondary | ICD-10-CM | POA: Diagnosis not present

## 2019-01-04 DIAGNOSIS — G894 Chronic pain syndrome: Secondary | ICD-10-CM | POA: Diagnosis not present

## 2019-01-04 DIAGNOSIS — G43909 Migraine, unspecified, not intractable, without status migrainosus: Secondary | ICD-10-CM | POA: Diagnosis not present

## 2019-01-07 ENCOUNTER — Other Ambulatory Visit: Payer: Self-pay | Admitting: Allergy and Immunology

## 2019-01-24 DIAGNOSIS — M7581 Other shoulder lesions, right shoulder: Secondary | ICD-10-CM | POA: Diagnosis not present

## 2019-01-24 DIAGNOSIS — M2550 Pain in unspecified joint: Secondary | ICD-10-CM | POA: Diagnosis not present

## 2019-01-24 DIAGNOSIS — M25311 Other instability, right shoulder: Secondary | ICD-10-CM | POA: Diagnosis not present

## 2019-01-28 DIAGNOSIS — M2550 Pain in unspecified joint: Secondary | ICD-10-CM | POA: Diagnosis not present

## 2019-02-01 DIAGNOSIS — G43909 Migraine, unspecified, not intractable, without status migrainosus: Secondary | ICD-10-CM | POA: Diagnosis not present

## 2019-02-01 DIAGNOSIS — M159 Polyosteoarthritis, unspecified: Secondary | ICD-10-CM | POA: Diagnosis not present

## 2019-02-01 DIAGNOSIS — I82409 Acute embolism and thrombosis of unspecified deep veins of unspecified lower extremity: Secondary | ICD-10-CM | POA: Diagnosis not present

## 2019-02-01 DIAGNOSIS — G894 Chronic pain syndrome: Secondary | ICD-10-CM | POA: Diagnosis not present

## 2019-02-03 DIAGNOSIS — M7581 Other shoulder lesions, right shoulder: Secondary | ICD-10-CM | POA: Diagnosis not present

## 2019-02-03 DIAGNOSIS — M25311 Other instability, right shoulder: Secondary | ICD-10-CM | POA: Diagnosis not present

## 2019-02-05 DIAGNOSIS — M25511 Pain in right shoulder: Secondary | ICD-10-CM | POA: Diagnosis not present

## 2019-02-06 DIAGNOSIS — R768 Other specified abnormal immunological findings in serum: Secondary | ICD-10-CM | POA: Diagnosis not present

## 2019-02-06 DIAGNOSIS — K648 Other hemorrhoids: Secondary | ICD-10-CM | POA: Diagnosis not present

## 2019-02-06 DIAGNOSIS — K5903 Drug induced constipation: Secondary | ICD-10-CM | POA: Diagnosis not present

## 2019-02-06 DIAGNOSIS — R1011 Right upper quadrant pain: Secondary | ICD-10-CM | POA: Diagnosis not present

## 2019-02-06 DIAGNOSIS — M25511 Pain in right shoulder: Secondary | ICD-10-CM | POA: Diagnosis not present

## 2019-02-20 ENCOUNTER — Telehealth: Payer: Self-pay | Admitting: Neurology

## 2019-02-20 NOTE — Telephone Encounter (Signed)
[employer] Auth: [id number] (exp. 02/20/19 to 08/18/19) order sent to GI.

## 2019-02-28 ENCOUNTER — Other Ambulatory Visit: Payer: Self-pay

## 2019-02-28 ENCOUNTER — Ambulatory Visit
Admission: RE | Admit: 2019-02-28 | Discharge: 2019-02-28 | Disposition: A | Discharge status: Home / Self Care | Payer: BLUE CROSS/BLUE SHIELD | Source: Ambulatory Visit | Attending: Neurology | Admitting: Neurology

## 2019-02-28 DIAGNOSIS — R51 Headache: Principal | ICD-10-CM

## 2019-02-28 DIAGNOSIS — R519 Headache, unspecified: Secondary | ICD-10-CM

## 2019-02-28 DIAGNOSIS — R930 Abnormal findings on diagnostic imaging of skull and head, not elsewhere classified: Secondary | ICD-10-CM | POA: Diagnosis not present

## 2019-02-28 DIAGNOSIS — C719 Malignant neoplasm of brain, unspecified: Secondary | ICD-10-CM

## 2019-02-28 MED ORDER — GADOBENATE DIMEGLUMINE 529 MG/ML IV SOLN
16.0000 mL | Freq: Once | INTRAVENOUS | Status: AC | PRN
  Administered 2019-02-28: 16 mL via INTRAVENOUS

## 2019-03-08 ENCOUNTER — Telehealth: Payer: Self-pay | Admitting: Neurology

## 2019-03-08 NOTE — Telephone Encounter (Signed)
Nina Wood, I have already sent patient a result note and sent her an email about this. The spot on her brain is the same size not bigger which is good news. But it is slightly brighter. I'd like to get patient on a video visit with webex to show it to her and discuss next steps maybe another opinion. Would you get that set up this week please? Thank you.

## 2019-03-10 DIAGNOSIS — G894 Chronic pain syndrome: Secondary | ICD-10-CM | POA: Diagnosis not present

## 2019-03-10 DIAGNOSIS — M159 Polyosteoarthritis, unspecified: Secondary | ICD-10-CM | POA: Diagnosis not present

## 2019-03-10 DIAGNOSIS — K580 Irritable bowel syndrome with diarrhea: Secondary | ICD-10-CM | POA: Diagnosis not present

## 2019-03-10 DIAGNOSIS — I82409 Acute embolism and thrombosis of unspecified deep veins of unspecified lower extremity: Secondary | ICD-10-CM | POA: Diagnosis not present

## 2019-03-10 NOTE — Telephone Encounter (Signed)
Called pt & LVM (ok per DPR) asking for call back to discuss setting up a video visit on webex. Advised pt that Dr. Jaynee Eagles sent her results and a message to mychart. The spot on her brain is the same size not bigger which is good news. But it is slightly brighter. I asked for a call back to discuss getting consent for a virtual visit. Advised pt that we would file it to her insurance like an office visit. Left office number in message.

## 2019-03-11 DIAGNOSIS — K648 Other hemorrhoids: Secondary | ICD-10-CM | POA: Diagnosis not present

## 2019-03-11 NOTE — Telephone Encounter (Signed)
Per MRI encounter, pt has read the result comments from Dr. Jaynee Eagles. I reached out to patient again and LVM (ok per DPR) asking for a call back to get  webex visit setup so Dr. Jaynee Eagles can review the images with her. Left office number in message.   When pt calls back, will need to get consent for video visit and to file with insurance before scheduling.

## 2019-03-12 ENCOUNTER — Encounter: Payer: Self-pay | Admitting: *Deleted

## 2019-03-12 NOTE — Telephone Encounter (Signed)
Dr.Ahern aware I haven't been able to reach pt. Mychart message sent.

## 2019-03-19 ENCOUNTER — Encounter: Payer: Self-pay | Admitting: *Deleted

## 2019-03-20 ENCOUNTER — Ambulatory Visit (INDEPENDENT_AMBULATORY_CARE_PROVIDER_SITE_OTHER): Payer: BLUE CROSS/BLUE SHIELD | Admitting: Neurology

## 2019-03-20 ENCOUNTER — Other Ambulatory Visit: Payer: Self-pay

## 2019-03-20 DIAGNOSIS — K5903 Drug induced constipation: Secondary | ICD-10-CM | POA: Diagnosis not present

## 2019-03-20 DIAGNOSIS — R9089 Other abnormal findings on diagnostic imaging of central nervous system: Secondary | ICD-10-CM

## 2019-03-20 DIAGNOSIS — R109 Unspecified abdominal pain: Secondary | ICD-10-CM | POA: Diagnosis not present

## 2019-03-20 DIAGNOSIS — C719 Malignant neoplasm of brain, unspecified: Secondary | ICD-10-CM | POA: Diagnosis not present

## 2019-03-20 DIAGNOSIS — G43709 Chronic migraine without aura, not intractable, without status migrainosus: Secondary | ICD-10-CM | POA: Diagnosis not present

## 2019-03-23 ENCOUNTER — Encounter: Payer: Self-pay | Admitting: Neurology

## 2019-03-23 DIAGNOSIS — R9089 Other abnormal findings on diagnostic imaging of central nervous system: Secondary | ICD-10-CM | POA: Insufficient documentation

## 2019-03-23 MED ORDER — METOCLOPRAMIDE HCL 10 MG PO TABS: 10.0000 mg | ORAL_TABLET | Freq: Four times a day (QID) | ORAL | 6 refills | Status: DC | PRN

## 2019-03-23 MED ORDER — FREMANEZUMAB-VFRM 225 MG/1.5ML ~~LOC~~ SOSY: 225.0000 mg | PREFILLED_SYRINGE | SUBCUTANEOUS | 11 refills | Status: DC

## 2019-03-23 MED ORDER — RIZATRIPTAN BENZOATE 10 MG PO TBDP: 10.0000 mg | ORAL_TABLET | ORAL | 11 refills | Status: DC | PRN

## 2019-03-23 NOTE — Progress Notes (Addendum)
[name]    Provider:  Dr Jaynee Eagles Referring Provider: Cher Nakai, MD Primary Care Physician:  Cher Nakai, MD   CC:  Abnormal MRI, Migraines  Addendum 06/11/2019: this email sent to patient: Hi Nina Wood, This is Dr. Jaynee Eagles. I wanted to let you know that I reviewed your notes from [facility] and they specifically state that your symptoms and lab tests do NOT suggest Lupus. This is great news. I would follow back up with them just to be sure though as I am not sure if anything happened after the office note was written. But its very important for you to know obviously - and not to tell doctors you have Lupus if you don't because we can make wrong diagnoses based on that. For example Dr. Vertell Limber was thinking your brain MRI findings was due to Lupus however since you don;t have Lupus then he may make the wrong diagnosis. So VERY important to clarify with [facility] and let docs know correctly. I was SO happy when I read that in her notes but please, please verify or get a copy of your notes to read yourself. Thanks, Dr. Loni Muse  Virtual Visit via Video Note  I connected with [name] on 03/23/19 at 11:15 AM EDT by a video enabled telemedicine application and verified that I am speaking with the correct person using two identifiers.  Location: Patient: home Provider: office   I discussed the limitations of evaluation and management by telemedicine and the availability of in person appointments. The patient expressed understanding and agreed to proceed.  Follow Up Instructions:    I discussed the assessment and treatment plan with the patient. The patient was provided an opportunity to ask questions and all were answered. The patient agreed with the plan and demonstrated an understanding of the instructions.   The patient was advised to call back or seek an in-person evaluation if the symptoms worsen or if the condition fails to improve as  anticipated.  I provided 40 minutes of non-face-to-face time during this encounter.   Melvenia Beam, MD    Interval history 03/20/2019:  Here today for follow up on MRI of the brain.   MRI 02/28/2019: There is an 11 x 8 mm T2/FLAIR focus in the subcortical frontoparietal white matter on the right with indistinct borders.  T1 signal is normal before contrast and there is very subtle enhancement after contrast is administered.  It is also slightly hyperintense on diffusion-weighted images.  The focus is unchanged in size compared to the 2019 MRI though the subtle enhancement was not noted on that exam.  Etiology is uncertain.  This could represent a focus of gliosis.  A low-grade glioma (WHO grade 2) is possible and continued surveillance is recommended.  We discussed the MRI of the brain. Unfortunately was not able to share on video so she will come into the office to take a look at the images. Discussed possibilities that this is a low-grade glioma. At this time would re-image in 3-6 months but also want to get opinion from neurosurgery and also oncology to see if any other imaging techniques may delineate this lesion. The lesion has not increased in size but can be seen on post-contrast images now   Interval history 12/25/2018: Here for follow up of migraines. MRI of the brain last year showed a focus of indistinct borders and recommend a follow up MRI. She has been noncompliant with medications but at last appointment placed her on Ajovy.  She is significantly improved. She only has 1-2 headaches a month. Baseline Daily headaches. 15 are migrainous.   08/20/2018: Addendum: Topiramate level 0. Patient endorsed compliance but appears to not be taking it.   Interval history 08/19/2018: Here for follow up of migraines.  MRI brain: reviewed images and agree with the following: 1.    There is a focus in the posterior right frontal deep white matter with indistinct borders.  The etiology is uncertain.   This could represent an area of gliosis.  The appearance is not typical for demyelination.   A low-grade glioma (WHO grade II) cannot be ruled out and a follow-up evaluation is suggested in several months. 2.     There is a normal enhancement pattern.  HPI:  Nina Wood is a 33 y.o. female here as a referral from Dr. Truman Hayward for migraines. PMHx DVT, GERD, IBS, Anemia, Anxiety, Chronic Pain, anxiety, obesity, Fibromyalgia. She has had migraines for several years. Started worsening with tingling in the face under the eyes all day and won't go away. The tingling started 6 months ago. In the bridge of the nose and cheeks and right under the nose. Continuous. She went 3 weeks without glasses and did not improve. Glasses on her face hurts. She has daily headaches. She can wake with them often. She denies snoring at night or sleep apnea medications. Daily headaches. It is always right above the eye and spreads to the forehead, pounding, throbbing, light and sound sensitivity. Daily headaches. 15 are migrainous. Plus tingling of face. Eyes hurt on movement. She has ringing in her ears and pressure in the ear. In the right ear. Headaches can be positional. She takes a lot of allergy medication.  Headaches started worsening 6 months ago. She has gained 20 pounds.  She also reports back pain, mild, stable, no changes in bowel or bladder or weakness. No other focal neurologic deficits, associated symptoms, inciting events or modifiable factors.  Gabapentin, meclizine   Reviewed notes, labs and imaging from outside physicians, which showed:  Reviewed images and agree with the following xr lumbar 03/2017:  IMPRESSION: Normal alignment of the lumbar spine. No acute displaced fractures identified.   Review of Systems: Patient complains of symptoms per HPI as well as the following symptoms: headache, joint pain, aching muscles, nausea, insomnia, eye pain. Pertinent negatives and positives per HPI. All others  negative.   Social History   Socioeconomic History   Marital status: Single    Spouse name: Not on file   Number of children: 3   Years of education: Not on file   Highest education level: Bachelor's degree (e.g., BA, AB, BS)  Occupational History   Not on file  Social Needs   Financial resource strain: Not on file   Food insecurity:    Worry: Not on file    Inability: Not on file   Transportation needs:    Medical: Not on file    Non-medical: Not on file  Tobacco Use   Smoking status: Never Smoker   Smokeless tobacco: Never Used  Substance and Sexual Activity   Alcohol use: Yes    Comment: OCCASIONAL   Drug use: No   Sexual activity: Yes    Birth control/protection: Implant  Lifestyle   Physical activity:    Days per week: Not on file    Minutes per session: Not on file   Stress: Not on file  Relationships   Social connections:    Talks on phone: Not  on file    Gets together: Not on file    Attends religious service: Not on file    Active member of club or organization: Not on file    Attends meetings of clubs or organizations: Not on file    Relationship status: Not on file   Intimate partner violence:    Fear of current or ex partner: Not on file    Emotionally abused: Not on file    Physically abused: Not on file    Forced sexual activity: Not on file  Other Topics Concern   Not on file  Social History Narrative   Lives at home with her children   Right handed   Caffeine:  1 cup of coffee daily during the week, daily tea     Family History  Problem Relation Age of Onset   Hypertension Mother    Breast cancer Mother    Prostate cancer Father    Hypertension Maternal Uncle    Diabetes Paternal Aunt    Cancer Paternal Aunt    Cancer Maternal Grandmother    Diabetes Sister     Past Medical History:  Diagnosis Date   Allergic rhinitis    Allergy to cats    Allergy to dogs    Anemia    Anxiety    Asthma    BMI  37.0-37.9, adult    Breast cyst    Chronic pain syndrome    Deep vein thrombosis (DVT) (HCC)    Endometriosis, uterus    Environmental allergies    Fibromyalgia syndrome    Generalized osteoarthrosis    GERD (gastroesophageal reflux disease)    Headache    Hip discomfort    IBS (irritable bowel syndrome)    with diarrhea   Lupus ([place])    Obesity    Ovarian cyst    Paroxysmal dyspnea    Uterine fibroid     Past Surgical History:  Procedure Laterality Date   APPENDECTOMY  2003   CESAREAN SECTION     DILATION AND CURETTAGE OF UTERUS     1 WEEK AGO   DILATION AND EVACUATION Bilateral 05/18/2016   Procedure: DILATATION AND EVACUATION;  Surgeon: Bobbye Charleston, MD;  Location: [facility];  Service: Gynecology;  Laterality: Bilateral;   ECTOPIC PREGNANCY SURGERY     etopi     GASTRIC BYPASS  03/14/2012   INDUCED ABORTION     OVARIAN CYST SURGERY      Current Outpatient Medications  Medication Sig Dispense Refill   albuterol (PROAIR HFA) 108 (90 Base) MCG/ACT inhaler Inhale 2 puffs into the lungs every 6 (six) hours as needed for wheezing or shortness of breath.     cetirizine (ZYRTEC) 10 MG tablet Take 20-30 mg by mouth at bedtime.      clonazePAM (KLONOPIN) 0.5 MG tablet Take 0.5 mg by mouth as needed.     EPINEPHrine (EPIPEN 2-PAK) 0.3 mg/0.3 mL IJ SOAJ injection Use as directed for life threatening allergic reactions 2 Device 3   etonogestrel (NEXPLANON) 68 MG IMPL implant 1 each by Subdermal route.     Fluticasone Furoate 50 MCG/ACT AEPB Inhale 2 sprays into the lungs daily as needed.      Fremanezumab-vfrm (AJOVY) 225 MG/1.5ML SOSY Inject 225 mg into the skin every 30 (thirty) days. 1 Syringe 0   Fremanezumab-vfrm (AJOVY) 225 MG/1.5ML SOSY Inject 225 mg into the skin every 30 (thirty) days. 1 Syringe 11   gabapentin (NEURONTIN) 800 MG tablet Take 400-800 mg by  mouth at bedtime as needed.      HYDROcodone-acetaminophen (NORCO) 7.5-325 MG tablet  Take 0.5-1 tablets by mouth every 6 (six) hours as needed for moderate pain.     Hyprom-Naphaz-Polysorb-Zn Sulf (CLEAR EYES COMPLETE OP) Place 1 drop into both eyes 2 (two) times daily as needed (dry eyes/itching).      ibuprofen (ADVIL,MOTRIN) 800 MG tablet TK 1 T PO  BID PRN     meloxicam (MOBIC) 7.5 MG tablet Take 7.5 mg by mouth 2 (two) times daily as needed.     metoCLOPramide (REGLAN) 10 MG tablet Take 1 tablet (10 mg total) by mouth every 6 (six) hours as needed for nausea. 30 tablet 6   montelukast (SINGULAIR) 10 MG tablet TAKE 1 TABLET BY MOUTH EVERY DAY 30 tablet 11   Olopatadine HCl (PAZEO) 0.7 % SOLN Apply 1 drop to eye daily.     orphenadrine (NORFLEX) 100 MG tablet Take 100 mg by mouth 2 (two) times daily as needed for muscle spasms.     rizatriptan (MAXALT-MLT) 10 MG disintegrating tablet Take 1 tablet (10 mg total) by mouth as needed for migraine. May repeat in 2 hours if needed 9 tablet 11   triamcinolone cream (KENALOG) 0.5 % Apply 1 application topically every 4 (four) hours as needed (rash/allergies).      No current facility-administered medications for this visit.     Allergies as of 03/20/2019 - Review Complete 03/19/2019  Allergen Reaction Noted   Fish allergy Anaphylaxis 02/24/2017   Other Swelling 04/27/2015   Penicillins Hives 04/27/2015   Shellfish allergy Anaphylaxis 04/27/2015   Peanut butter flavor Hives 04/27/2015   Histamine Other (See Comments) 04/27/2015   Voltaren [diclofenac] Rash and Other (See Comments) 04/27/2015    Vitals: There were no vitals taken for this visit. Last Weight:  Wt Readings from Last 1 Encounters:  12/25/18 179 lb (81.2 kg)   Last Height:   Ht Readings from Last 1 Encounters:  12/25/18 4' 7.4" (1.407 m)   Physical exam: Exam: Gen: NAD, conversant, well nourised, obese, well groomed                     CV: RRR, no MRG. No Carotid Bruits. No peripheral edema, warm, nontender Eyes: Conjunctivae clear without  exudates or hemorrhage  Neuro: Detailed Neurologic Exam  Speech:    Speech is normal; fluent and spontaneous with normal comprehension.  Cognition:    The patient is oriented to person, place, and time;     recent and remote memory intact;     language fluent;     normal attention, concentration,     fund of knowledge Cranial Nerves:    The pupils are equal, round, and reactive to light. The fundi are normal and spontaneous venous pulsations are present. Visual fields are full to finger confrontation. Extraocular movements are intact. Trigeminal sensation is intact and the muscles of mastication are normal. The face is symmetric. The palate elevates in the midline. Hearing intact. Voice is normal. Shoulder shrug is normal. The tongue has normal motion without fasciculations.   Coordination:    Normal finger to nose and heel to shin. Normal rapid alternating movements.   Gait:    Heel-toe and tandem gait are normal.   Motor Observation:    No asymmetry, no atrophy, and no involuntary movements noted. Tone:    Normal muscle tone.    Posture:    Posture is normal. normal erect    Strength:  Strength is V/V in the upper and lower limbs.      Sensation: intact to LT     Reflex Exam:  DTR's:    Deep tendon reflexes in the upper and lower extremities are normal bilaterally.   Toes:    The toes are downgoing bilaterally.   Clonus:    Clonus is absent.   Assessment/Plan:  33 year old with migraines and non-specific focus . PMHx DVT, GERD, IBS, Anemia, Anxiety, Chronic Pain, anxiety, obesity, Fibromyalgia.  Lesion seen on prior MRI brain, repeat shows same lesion now slightly enhancing.  (Addendum 06/11/2019: this email sent to patient: Hi Nina Wood, This is Dr. Jaynee Eagles. I wanted to let you know that I reviewed your notes from [facility] and they specifically state that your symptoms and lab tests do NOT suggest Lupus. This is great news. I would follow back up with  them just to be sure though as I am not sure if anything happened after the office note was written. But its very important for you to know obviously - and not to tell doctors you have Lupus if you don't because we can make wrong diagnoses based on that. For example Dr. Vertell Limber was thinking your brain MRI findings was due to Lupus however since you don;t have Lupus then he may make the wrong diagnosis. So VERY important to clarify with [facility] and let docs know correctly. I was SO happy when I read that in her notes but please, please verify or get a copy of your notes to read yourself. Thanks, Dr. Loni Muse)  Low grade glioma(?):  -  There is an 11 x 8 mm T2/FLAIR focus in the subcortical frontoparietal white matter on the right with indistinct borders.  T1 signal is normal before contrast and there is very subtle enhancement after contrast is administered.  It is also slightly hyperintense on diffusion-weighted images.  The focus is unchanged in size compared to the 2019 MRI though the subtle enhancement was not noted on that exam.Etiology is uncertain.  This could represent a focus of gliosis.  A low-grade glioma (WHO grade 2) is possible.   - Discussed with Dr. Vertell Limber, they will contact her tomorrow  Migraine:  Continue CGRP for migraine prevention, improved Acute management with maxalt and Reglan  Other:  Lupus? Patient has not had a formal diagnosis but says her primary care has discussed with her and she is going to Rheumatology and will send [place] the note.   Orders Placed This Encounter  Procedures   Ambulatory referral to Neurosurgery   Meds ordered this encounter  Medications   rizatriptan (MAXALT-MLT) 10 MG disintegrating tablet    Sig: Take 1 tablet (10 mg total) by mouth as needed for migraine. May repeat in 2 hours if needed    Dispense:  9 tablet    Refill:  11   metoCLOPramide (REGLAN) 10 MG tablet    Sig: Take 1 tablet (10 mg total) by mouth every 6 (six) hours as  needed for nausea.    Dispense:  30 tablet    Refill:  6   Fremanezumab-vfrm (AJOVY) 225 MG/1.5ML SOSY    Sig: Inject 225 mg into the skin every 30 (thirty) days.    Dispense:  1 Syringe    Refill:  11     Discussed: To prevent or relieve headaches, try the following: Cool Compress. Lie down and place a cool compress on your head.  Avoid headache triggers. If certain foods or odors seem  to have triggered your migraines in the past, avoid them. A headache diary might help you identify triggers.  Include physical activity in your daily routine. Try a daily walk or other moderate aerobic exercise.  Manage stress. Find healthy ways to cope with the stressors, such as delegating tasks on your to-do list.  Practice relaxation techniques. Try deep breathing, yoga, massage and visualization.  Eat regularly. Eating regularly scheduled meals and maintaining a healthy diet might help prevent headaches. Also, drink plenty of fluids.  Follow a regular sleep schedule. Sleep deprivation might contribute to headaches Consider biofeedback. With this mind-body technique, you learn to control certain bodily functions -- such as muscle tension, heart rate and blood pressure -- to prevent headaches or reduce headache pain.    Proceed to emergency room if you experience new or worsening symptoms or symptoms do not resolve, if you have new neurologic symptoms or if headache is severe, or for any concerning symptom.   Provided education and documentation from American headache Society toolbox including articles on: chronic migraine medication overuse headache, chronic migraines, prevention of migraines, behavioral and other nonpharmacologic treatments for headache.  Cc: Dr. Truman Hayward, Joylene Igo, Dr. Dierdre Harness

## 2019-03-26 ENCOUNTER — Other Ambulatory Visit: Payer: Self-pay | Admitting: Radiation Therapy

## 2019-03-26 DIAGNOSIS — D496 Neoplasm of unspecified behavior of brain: Secondary | ICD-10-CM | POA: Diagnosis not present

## 2019-03-26 DIAGNOSIS — G43919 Migraine, unspecified, intractable, without status migrainosus: Secondary | ICD-10-CM | POA: Diagnosis not present

## 2019-03-26 DIAGNOSIS — Z0289 Encounter for other administrative examinations: Secondary | ICD-10-CM

## 2019-03-26 NOTE — Telephone Encounter (Signed)
FMLA for 2-3 days of intermittent leave completed, signed & sent to medical records for processing.

## 2019-03-27 DIAGNOSIS — R109 Unspecified abdominal pain: Secondary | ICD-10-CM | POA: Diagnosis not present

## 2019-04-10 DIAGNOSIS — G894 Chronic pain syndrome: Secondary | ICD-10-CM | POA: Diagnosis not present

## 2019-04-10 DIAGNOSIS — G43909 Migraine, unspecified, not intractable, without status migrainosus: Secondary | ICD-10-CM | POA: Diagnosis not present

## 2019-04-10 DIAGNOSIS — M159 Polyosteoarthritis, unspecified: Secondary | ICD-10-CM | POA: Diagnosis not present

## 2019-04-10 DIAGNOSIS — I82409 Acute embolism and thrombosis of unspecified deep veins of unspecified lower extremity: Secondary | ICD-10-CM | POA: Diagnosis not present

## 2019-05-10 DIAGNOSIS — G894 Chronic pain syndrome: Secondary | ICD-10-CM | POA: Diagnosis not present

## 2019-05-10 DIAGNOSIS — M159 Polyosteoarthritis, unspecified: Secondary | ICD-10-CM | POA: Diagnosis not present

## 2019-05-10 DIAGNOSIS — E669 Obesity, unspecified: Secondary | ICD-10-CM | POA: Diagnosis not present

## 2019-05-10 DIAGNOSIS — G43909 Migraine, unspecified, not intractable, without status migrainosus: Secondary | ICD-10-CM | POA: Diagnosis not present

## 2019-05-10 DIAGNOSIS — I82409 Acute embolism and thrombosis of unspecified deep veins of unspecified lower extremity: Secondary | ICD-10-CM | POA: Diagnosis not present

## 2019-06-04 DIAGNOSIS — Z20828 Contact with and (suspected) exposure to other viral communicable diseases: Secondary | ICD-10-CM | POA: Diagnosis not present

## 2019-06-04 DIAGNOSIS — K580 Irritable bowel syndrome with diarrhea: Secondary | ICD-10-CM | POA: Diagnosis not present

## 2019-06-04 DIAGNOSIS — D649 Anemia, unspecified: Secondary | ICD-10-CM | POA: Diagnosis not present

## 2019-06-04 DIAGNOSIS — J309 Allergic rhinitis, unspecified: Secondary | ICD-10-CM | POA: Diagnosis not present

## 2019-06-09 DIAGNOSIS — G894 Chronic pain syndrome: Secondary | ICD-10-CM | POA: Diagnosis not present

## 2019-06-09 DIAGNOSIS — I82409 Acute embolism and thrombosis of unspecified deep veins of unspecified lower extremity: Secondary | ICD-10-CM | POA: Diagnosis not present

## 2019-06-09 DIAGNOSIS — M159 Polyosteoarthritis, unspecified: Secondary | ICD-10-CM | POA: Diagnosis not present

## 2019-06-09 DIAGNOSIS — G43909 Migraine, unspecified, not intractable, without status migrainosus: Secondary | ICD-10-CM | POA: Diagnosis not present

## 2019-06-17 ENCOUNTER — Other Ambulatory Visit: Payer: Self-pay | Admitting: Neurosurgery

## 2019-06-17 DIAGNOSIS — D496 Neoplasm of unspecified behavior of brain: Secondary | ICD-10-CM

## 2019-07-10 DIAGNOSIS — I82409 Acute embolism and thrombosis of unspecified deep veins of unspecified lower extremity: Secondary | ICD-10-CM | POA: Diagnosis not present

## 2019-07-10 DIAGNOSIS — G43909 Migraine, unspecified, not intractable, without status migrainosus: Secondary | ICD-10-CM | POA: Diagnosis not present

## 2019-07-10 DIAGNOSIS — M159 Polyosteoarthritis, unspecified: Secondary | ICD-10-CM | POA: Diagnosis not present

## 2019-07-10 DIAGNOSIS — G894 Chronic pain syndrome: Secondary | ICD-10-CM | POA: Diagnosis not present

## 2019-07-18 ENCOUNTER — Other Ambulatory Visit: Payer: Self-pay | Admitting: Allergy and Immunology

## 2019-07-18 ENCOUNTER — Other Ambulatory Visit: Payer: Self-pay | Admitting: Neurology

## 2019-07-18 DIAGNOSIS — G43709 Chronic migraine without aura, not intractable, without status migrainosus: Secondary | ICD-10-CM

## 2019-08-03 DIAGNOSIS — Z20828 Contact with and (suspected) exposure to other viral communicable diseases: Secondary | ICD-10-CM | POA: Diagnosis not present

## 2019-08-13 DIAGNOSIS — Z23 Encounter for immunization: Secondary | ICD-10-CM | POA: Diagnosis not present

## 2019-08-13 DIAGNOSIS — G43909 Migraine, unspecified, not intractable, without status migrainosus: Secondary | ICD-10-CM | POA: Diagnosis not present

## 2019-08-13 DIAGNOSIS — G894 Chronic pain syndrome: Secondary | ICD-10-CM | POA: Diagnosis not present

## 2019-08-13 DIAGNOSIS — I82409 Acute embolism and thrombosis of unspecified deep veins of unspecified lower extremity: Secondary | ICD-10-CM | POA: Diagnosis not present

## 2019-08-13 DIAGNOSIS — M159 Polyosteoarthritis, unspecified: Secondary | ICD-10-CM | POA: Diagnosis not present

## 2019-09-12 DIAGNOSIS — I82409 Acute embolism and thrombosis of unspecified deep veins of unspecified lower extremity: Secondary | ICD-10-CM | POA: Diagnosis not present

## 2019-09-12 DIAGNOSIS — G894 Chronic pain syndrome: Secondary | ICD-10-CM | POA: Diagnosis not present

## 2019-09-12 DIAGNOSIS — M159 Polyosteoarthritis, unspecified: Secondary | ICD-10-CM | POA: Diagnosis not present

## 2019-09-12 DIAGNOSIS — K580 Irritable bowel syndrome with diarrhea: Secondary | ICD-10-CM | POA: Diagnosis not present

## 2019-10-02 DIAGNOSIS — G43909 Migraine, unspecified, not intractable, without status migrainosus: Secondary | ICD-10-CM | POA: Diagnosis not present

## 2019-10-02 DIAGNOSIS — M159 Polyosteoarthritis, unspecified: Secondary | ICD-10-CM | POA: Diagnosis not present

## 2019-10-02 DIAGNOSIS — I82409 Acute embolism and thrombosis of unspecified deep veins of unspecified lower extremity: Secondary | ICD-10-CM | POA: Diagnosis not present

## 2019-10-02 DIAGNOSIS — G894 Chronic pain syndrome: Secondary | ICD-10-CM | POA: Diagnosis not present

## 2019-10-11 DIAGNOSIS — G43909 Migraine, unspecified, not intractable, without status migrainosus: Secondary | ICD-10-CM | POA: Diagnosis not present

## 2019-10-11 DIAGNOSIS — I82409 Acute embolism and thrombosis of unspecified deep veins of unspecified lower extremity: Secondary | ICD-10-CM | POA: Diagnosis not present

## 2019-10-11 DIAGNOSIS — G894 Chronic pain syndrome: Secondary | ICD-10-CM | POA: Diagnosis not present

## 2019-10-11 DIAGNOSIS — M159 Polyosteoarthritis, unspecified: Secondary | ICD-10-CM | POA: Diagnosis not present

## 2019-11-20 DIAGNOSIS — I82409 Acute embolism and thrombosis of unspecified deep veins of unspecified lower extremity: Secondary | ICD-10-CM | POA: Diagnosis not present

## 2019-11-20 DIAGNOSIS — M159 Polyosteoarthritis, unspecified: Secondary | ICD-10-CM | POA: Diagnosis not present

## 2019-11-20 DIAGNOSIS — G43909 Migraine, unspecified, not intractable, without status migrainosus: Secondary | ICD-10-CM | POA: Diagnosis not present

## 2019-11-20 DIAGNOSIS — G894 Chronic pain syndrome: Secondary | ICD-10-CM | POA: Diagnosis not present

## 2019-12-10 ENCOUNTER — Ambulatory Visit: Payer: BLUE CROSS/BLUE SHIELD | Admitting: Allergy and Immunology

## 2019-12-18 DIAGNOSIS — G609 Hereditary and idiopathic neuropathy, unspecified: Secondary | ICD-10-CM | POA: Diagnosis not present

## 2019-12-18 DIAGNOSIS — M159 Polyosteoarthritis, unspecified: Secondary | ICD-10-CM | POA: Diagnosis not present

## 2019-12-18 DIAGNOSIS — I82409 Acute embolism and thrombosis of unspecified deep veins of unspecified lower extremity: Secondary | ICD-10-CM | POA: Diagnosis not present

## 2019-12-18 DIAGNOSIS — G894 Chronic pain syndrome: Secondary | ICD-10-CM | POA: Diagnosis not present

## 2019-12-30 ENCOUNTER — Encounter: Payer: Self-pay | Admitting: Neurology

## 2019-12-30 ENCOUNTER — Ambulatory Visit: Payer: BC Managed Care – PPO | Admitting: Neurology

## 2019-12-30 ENCOUNTER — Other Ambulatory Visit: Payer: Self-pay

## 2019-12-30 VITALS — BP 116/83 | HR 70 | Temp 98.9°F | Ht <= 58 in | Wt 185.0 lb

## 2019-12-30 DIAGNOSIS — M79605 Pain in left leg: Secondary | ICD-10-CM | POA: Diagnosis not present

## 2019-12-30 DIAGNOSIS — R34 Anuria and oliguria: Secondary | ICD-10-CM

## 2019-12-30 DIAGNOSIS — R29898 Other symptoms and signs involving the musculoskeletal system: Secondary | ICD-10-CM | POA: Diagnosis not present

## 2019-12-30 DIAGNOSIS — R202 Paresthesia of skin: Secondary | ICD-10-CM

## 2019-12-30 DIAGNOSIS — E669 Obesity, unspecified: Secondary | ICD-10-CM

## 2019-12-30 DIAGNOSIS — G5732 Lesion of lateral popliteal nerve, left lower limb: Secondary | ICD-10-CM | POA: Diagnosis not present

## 2019-12-30 NOTE — Progress Notes (Signed)
[id number]    Provider:  Dr Jaynee Eagles Referring Provider: Cher Nakai, MD Primary Care Physician:  Cher Nakai, MD   CC:  Abnormal MRI, Migraines  HPI 12/30/2019: Patient is here as a new request for peripheral  neuropathy from Dr. Truman Hayward. PMHx migraines, fibromyalgia, chronic pain syndrome, anxiety, asthma.  Started a month ago, no inciting event, with tender and sensitivity lateral lower leg, then progressed to also the medial lower leg. It throbs. And sensitive to the touch, she can't even lay on it because it hurts, progressively worsening. She feels her ankle is going to "pop off". Nothing in the toes or feet. She also a week later reported pain in the lateral left thigh. No low back pain. No weakness. Touching it makes it worse and rubbing up against it. Stretching it out makes it worse. Laying on it makes it worse. The thigh pain is lateral thigh but right above the knee. She has gained 30 pounds recently. She does have pain on stretching the leg, she has "popping" in the low back,  she can't hold her urine anymore like she used to but not urinating on herself but she is leaking on herself. Pain medication such as Gabapentin helps, she had a blood clot in the past but this is completely new, she denies any trauma, no long car rides or immobilization. She has gained weight. She has a lot of stress she has someone stalking her but this is unrelated. She has not seen Dr. Vertell Limber recently, he had planned to reorder the MRI of the brain and I encouraged her to email his office and showed her the patient portal. She is taking gabapentin for the pain.  Reviewed images of lumbar xr 2018 and agree: There is no evidence of lumbar spine fracture. Alignment is normal. Intervertebral disc spaces are maintained.  IMPRESSION: Normal alignment of the lumbar spine. No acute displaced fractures identified.  Addendum 06/11/2019: this email sent to patient: Hi Nina Wood, This is Dr. Jaynee Eagles. I wanted  to let you know that I reviewed your notes from [facility] and they specifically state that your symptoms and lab tests do NOT suggest Lupus. This is great news. I would follow back up with them just to be sure though as I am not sure if anything happened after the office note was written. But its very important for you to know obviously - and not to tell doctors you have Lupus if you don't because we can make wrong diagnoses based on that. For example Dr. Vertell Limber was thinking your brain MRI findings was due to Lupus however since you don;t have Lupus then he may make the wrong diagnosis. So VERY important to clarify with [facility] and let docs know correctly. I was SO happy when I read that in her notes but please, please verify or get a copy of your notes to read yourself. Thanks, Dr. Loni Muse  Virtual Visit via Video Note  I connected with [name] on 12/30/19 at  2:00 PM EST by a video enabled telemedicine application and verified that I am speaking with the correct person using two identifiers.  Location: Patient: home Provider: office   I discussed the limitations of evaluation and management by telemedicine and the availability of in person appointments. The patient expressed understanding and agreed to proceed.  Follow Up Instructions:    I discussed the assessment and treatment plan with the patient. The patient was provided an opportunity to ask questions and all were  answered. The patient agreed with the plan and demonstrated an understanding of the instructions.   The patient was advised to call back or seek an in-person evaluation if the symptoms worsen or if the condition fails to improve as anticipated.  I provided 40 minutes of non-face-to-face time during this encounter.   Melvenia Beam, MD    Interval history 03/20/2019:  Here today for follow up on MRI of the brain.   MRI 02/28/2019: There is an 11 x 8 mm T2/FLAIR focus in the subcortical  frontoparietal white matter on the right with indistinct borders.  T1 signal is normal before contrast and there is very subtle enhancement after contrast is administered.  It is also slightly hyperintense on diffusion-weighted images.  The focus is unchanged in size compared to the 2019 MRI though the subtle enhancement was not noted on that exam.  Etiology is uncertain.  This could represent a focus of gliosis.  A low-grade glioma (WHO grade 2) is possible and continued surveillance is recommended.  We discussed the MRI of the brain. Unfortunately was not able to share on video so she will come into the office to take a look at the images. Discussed possibilities that this is a low-grade glioma. At this time would re-image in 3-6 months but also want to get opinion from neurosurgery and also oncology to see if any other imaging techniques may delineate this lesion. The lesion has not increased in size but can be seen on post-contrast images now   Interval history 12/25/2018: Here for follow up of migraines. MRI of the brain last year showed a focus of indistinct borders and recommend a follow up MRI. She has been noncompliant with medications but at last appointment placed her on Ajovy. She is significantly improved. She only has 1-2 headaches a month. Baseline Daily headaches. 15 are migrainous.   08/20/2018: Addendum: Topiramate level 0. Patient endorsed compliance but appears to not be taking it.   Interval history 08/19/2018: Here for follow up of migraines.  MRI brain: reviewed images and agree with the following: 1.    There is a focus in the posterior right frontal deep white matter with indistinct borders.  The etiology is uncertain.  This could represent an area of gliosis.  The appearance is not typical for demyelination.   A low-grade glioma (WHO grade II) cannot be ruled out and a follow-up evaluation is suggested in several months. 2.     There is a normal enhancement pattern.  HPI:   BRAELIN RUMMLER is a 34 y.o. female here as a referral from Dr. Truman Hayward for migraines. PMHx DVT, GERD, IBS, Anemia, Anxiety, Chronic Pain, anxiety, obesity, Fibromyalgia. She has had migraines for several years. Started worsening with tingling in the face under the eyes all day and won't go away. The tingling started 6 months ago. In the bridge of the nose and cheeks and right under the nose. Continuous. She went 3 weeks without glasses and did not improve. Glasses on her face hurts. She has daily headaches. She can wake with them often. She denies snoring at night or sleep apnea medications. Daily headaches. It is always right above the eye and spreads to the forehead, pounding, throbbing, light and sound sensitivity. Daily headaches. 15 are migrainous. Plus tingling of face. Eyes hurt on movement. She has ringing in her ears and pressure in the ear. In the right ear. Headaches can be positional. She takes a lot of allergy medication.  Headaches started worsening  6 months ago. She has gained 20 pounds.  She also reports back pain, mild, stable, no changes in bowel or bladder or weakness. No other focal neurologic deficits, associated symptoms, inciting events or modifiable factors.  Gabapentin, meclizine   Reviewed notes, labs and imaging from outside physicians, which showed:  Reviewed images and agree with the following xr lumbar 03/2017:  IMPRESSION: Normal alignment of the lumbar spine. No acute displaced fractures identified.   Review of Systems: Patient complains of symptoms per HPI as well as the following symptoms: headache, joint pain, aching muscles, nausea, insomnia, eye pain. Pertinent negatives and positives per HPI. All others negative.   Social History   Socioeconomic History  . Marital status: Single    Spouse name: Not on file  . Number of children: 3  . Years of education: Not on file  . Highest education level: Bachelor's degree (e.g., BA, AB, BS)  Occupational History  . Not  on file  Tobacco Use  . Smoking status: Never Smoker  . Smokeless tobacco: Never Used  Substance and Sexual Activity  . Alcohol use: Yes    Alcohol/week: 2.0 - 4.0 standard drinks    Types: 2 - 4 Glasses of wine per week  . Drug use: No  . Sexual activity: Yes    Birth control/protection: Implant  Other Topics Concern  . Not on file  Social History Narrative   Lives at home with her children   Right handed   Caffeine:  At least 4 venti size coffees per day, doesn't drink tea much, drinks 48 oz water daily   Social Determinants of Health   Financial Resource Strain:   . Difficulty of Paying Living Expenses: Not on file  Food Insecurity:   . Worried About Charity fundraiser in the Last Year: Not on file  . Ran Out of Food in the Last Year: Not on file  Transportation Needs:   . Lack of Transportation (Medical): Not on file  . Lack of Transportation (Non-Medical): Not on file  Physical Activity:   . Days of Exercise per Week: Not on file  . Minutes of Exercise per Session: Not on file  Stress:   . Feeling of Stress : Not on file  Social Connections:   . Frequency of Communication with Friends and Family: Not on file  . Frequency of Social Gatherings with Friends and Family: Not on file  . Attends Religious Services: Not on file  . Active Member of Clubs or Organizations: Not on file  . Attends Archivist Meetings: Not on file  . Marital Status: Not on file  Intimate Partner Violence:   . Fear of Current or Ex-Partner: Not on file  . Emotionally Abused: Not on file  . Physically Abused: Not on file  . Sexually Abused: Not on file    Family History  Problem Relation Age of Onset  . Hypertension Mother   . Breast cancer Mother   . Prostate cancer Father   . Hypertension Maternal Uncle   . Diabetes Paternal Aunt   . Cancer Paternal Aunt   . Cancer Maternal Grandmother   . Diabetes Sister   . Neuropathy Neg Hx        none pt is aware of    Past Medical  History:  Diagnosis Date  . Allergic rhinitis   . Allergy to cats   . Allergy to dogs   . Anemia   . Anxiety   . Asthma   .  BMI 37.0-37.9, adult   . Breast cyst   . Chronic pain syndrome   . Deep vein thrombosis (DVT) ([place])   . Endometriosis, uterus   . Environmental allergies   . Fibromyalgia syndrome   . Generalized osteoarthrosis   . GERD (gastroesophageal reflux disease)   . Headache   . Hip discomfort   . IBS (irritable bowel syndrome)    with diarrhea  . Obesity   . Ovarian cyst   . Paroxysmal dyspnea   . Uterine fibroid     Past Surgical History:  Procedure Laterality Date  . APPENDECTOMY  2003  . CESAREAN SECTION    . DILATION AND CURETTAGE OF UTERUS     1 WEEK AGO  . DILATION AND EVACUATION Bilateral 05/18/2016   Procedure: DILATATION AND EVACUATION;  Surgeon: Bobbye Charleston, MD;  Location: [facility];  Service: Gynecology;  Laterality: Bilateral;  . ECTOPIC PREGNANCY SURGERY    . etopi    . GASTRIC BYPASS  03/14/2012  . INDUCED ABORTION    . OVARIAN CYST SURGERY      Current Outpatient Medications  Medication Sig Dispense Refill  . AJOVY 225 MG/1.5ML SOSY INJECT 225 MG UNDER THE SKIN EVERY 30 DAYS 1.5 mL 11  . albuterol (PROAIR HFA) 108 (90 Base) MCG/ACT inhaler Inhale 2 puffs into the lungs every 6 (six) hours as needed for wheezing or shortness of breath.    . cetirizine (ZYRTEC) 10 MG tablet Take 20-30 mg by mouth at bedtime.     . clonazePAM (KLONOPIN) 0.5 MG tablet Take 0.5 mg by mouth as needed.    Marland Kitchen EPINEPHRINE 0.3 mg/0.3 mL IJ SOAJ injection USE AS DIRECTED FOR LIFE THREATENING ALLERGIC REACTIONS 4 each 0  . etonogestrel (NEXPLANON) 68 MG IMPL implant 1 each by Subdermal route.    . Fluticasone Furoate 50 MCG/ACT AEPB Inhale 2 sprays into the lungs daily as needed.     . Fremanezumab-vfrm (AJOVY) 225 MG/1.5ML SOSY Inject 225 mg into the skin every 30 (thirty) days. 1 Syringe 0  . gabapentin (NEURONTIN) 800 MG tablet Take 400-800 mg by mouth at bedtime  as needed.     Marland Kitchen HYDROcodone-acetaminophen (NORCO) 7.5-325 MG tablet Take 0.5-1 tablets by mouth every 6 (six) hours as needed for moderate pain.    . Hyprom-Naphaz-Polysorb-Zn Sulf (CLEAR EYES COMPLETE OP) Place 1 drop into both eyes 2 (two) times daily as needed (dry eyes/itching).     Marland Kitchen ibuprofen (ADVIL,MOTRIN) 800 MG tablet TK 1 T PO  BID PRN    . meloxicam (MOBIC) 7.5 MG tablet Take 7.5 mg by mouth 2 (two) times daily as needed.    . metoCLOPramide (REGLAN) 10 MG tablet Take 1 tablet (10 mg total) by mouth every 6 (six) hours as needed for nausea. 30 tablet 6  . montelukast (SINGULAIR) 10 MG tablet TAKE 1 TABLET BY MOUTH EVERY DAY 30 tablet 11  . Olopatadine HCl (PAZEO) 0.7 % SOLN Apply 1 drop to eye daily.    . orphenadrine (NORFLEX) 100 MG tablet Take 100 mg by mouth 2 (two) times daily as needed for muscle spasms.    . rizatriptan (MAXALT-MLT) 10 MG disintegrating tablet Take 1 tablet (10 mg total) by mouth as needed for migraine. May repeat in 2 hours if needed 9 tablet 11  . triamcinolone cream (KENALOG) 0.5 % Apply 1 application topically every 4 (four) hours as needed (rash/allergies).      No current facility-administered medications for this visit.    Allergies as  of 12/30/2019 - Review Complete 12/30/2019  Allergen Reaction Noted  . Fish allergy Anaphylaxis 02/24/2017  . Other Swelling 04/27/2015  . Penicillins Hives 04/27/2015  . Shellfish allergy Anaphylaxis 04/27/2015  . Peanut butter flavor Hives 04/27/2015  . Histamine Other (See Comments) 04/27/2015  . Voltaren [diclofenac] Rash and Other (See Comments) 04/27/2015    Vitals: BP 116/83 (BP Location: Left Arm, Patient Position: Sitting)   Pulse 70   Temp 98.9 F (37.2 C) Comment: taken at front  Ht 4' 7.5" (1.41 m)   Wt 185 lb (83.9 kg)   BMI 42.23 kg/m  Last Weight:  Wt Readings from Last 1 Encounters:  12/30/19 185 lb (83.9 kg)   Last Height:   Ht Readings from Last 1 Encounters:  12/30/19 4' 7.5" (1.41  m)   Physical exam: Exam: Gen: NAD, conversant, well nourised, obese, well groomed                     CV: RRR, no MRG. No Carotid Bruits. No peripheral edema, warm, nontender Eyes: Conjunctivae clear without exudates or hemorrhage  Neuro: Detailed Neurologic Exam  Speech:    Speech is normal; fluent and spontaneous with normal comprehension.  Cognition:    The patient is oriented to person, place, and time;     recent and remote memory intact;     language fluent;     normal attention, concentration,     fund of knowledge Cranial Nerves:    The pupils are equal, round, and reactive to light. The fundi are normal and spontaneous venous pulsations are present. Visual fields are full to finger confrontation. Extraocular movements are intact. Trigeminal sensation is intact and the muscles of mastication are normal. The face is symmetric. The palate elevates in the midline. Hearing intact. Voice is normal. Shoulder shrug is normal. The tongue has normal motion without fasciculations.   Coordination:    Normal finger to nose and heel to shin. Normal rapid alternating movements.   Gait:    Heel-toe and tandem gait are normal.   Motor Observation:    No asymmetry, no atrophy, and no involuntary movements noted. Tone:    Normal muscle tone.    Posture:    Posture is normal. normal erect    Strength: left hip flexion and leg flexion weakness mild, otherwise strength is V/V in the upper and lower limbs.      Sensation: intact to LT     Reflex Exam:  DTR's: Absent left AJ otherwise deep tendon reflexes in the upper and lower extremities are normal bilaterally.   Toes:    The toes are downgoing bilaterally.   Clonus:    Clonus is absent.   Assessment/Plan:  34 year old with migraines and non-specific focus . PMHx DVT, GERD, IBS, Anemia, Anxiety, Chronic Pain, anxiety, obesity, Fibromyalgia. Lesion seen on prior MRI brain, repeat shows same lesion now slightly enhancing, she needs  to follow up with Dr. Vertell Limber for repeat MRI brain I showed her the patient portal to email him. Today she is here for a new problem left leg pain, this is been ongoing for 6 weeks, worsening, she has been under the care of her primary care physician, she is tried gabapentin and analgesics and other over-the-counter medications, she is also tried other conservative measures.  She does have radiating pain in the left lateral thigh and left lower extremity and given her symptoms of urinary changes I do think an MRI of the lumbar spine  for radiculopathy is warranted.  We will also perform a work-up for possible left peroneal neuropathy I had a long discussion with patient today regarding this.  An ultrasound to check for a growth or cyst. An X-ray to check the bones of your knee and leg. MRI lumbar spine  Orders Placed This Encounter  Procedures  . DG Knee 3 Views Left  . [place] LT LOWER EXTREM LTD SOFT TISSUE NON VASCULAR  . MR LUMBAR SPINE WO CONTRAST  . Ambulatory referral to [facility]  . NCV with EMG(electromyography)     (Addendum 06/11/2019: this email sent to patient: Hi Nina Wood, This is Dr. Jaynee Eagles. I wanted to let you know that I reviewed your notes from [place] and they specifically state that your symptoms and lab tests do NOT suggest Lupus. This is great news. I would follow back up with them just to be sure though as I am not sure if anything happened after the office note was written. But its very important for you to know obviously - and not to tell doctors you have Lupus if you don't because we can make wrong diagnoses based on that. For example Dr. Vertell Limber was thinking your brain MRI findings was due to Lupus however since you don;t have Lupus then he may make the wrong diagnosis. So VERY important to clarify with [facility] and let docs know correctly. I was SO happy when I read that in her notes but please, please verify or get a copy of your notes to read  yourself. Thanks, Dr. Loni Muse)  Low grade glioma(?):  -  There is an 11 x 8 mm T2/FLAIR focus in the subcortical frontoparietal white matter on the right with indistinct borders.  T1 signal is normal before contrast and there is very subtle enhancement after contrast is administered.  It is also slightly hyperintense on diffusion-weighted images.  The focus is unchanged in size compared to the 2019 MRI though the subtle enhancement was not noted on that exam.Etiology is uncertain.  This could represent a focus of gliosis.  A low-grade glioma (WHO grade 2) is possible.   - Discussed with Dr. Vertell Limber, they will contact her tomorrow  Migraine:  Continue CGRP for migraine prevention, improved Acute management with maxalt and Reglan  Other:  Lupus? Patient has not had a formal diagnosis but says her primary care has discussed with her and she is going to Rheumatology and will send [place] the note.   Orders Placed This Encounter  Procedures  . DG Knee 3 Views Left  . [place] LT LOWER EXTREM LTD SOFT TISSUE NON VASCULAR  . MR LUMBAR SPINE WO CONTRAST  . Ambulatory referral to [facility]  . NCV with EMG(electromyography)   No orders of the defined types were placed in this encounter.    Discussed: To prevent or relieve headaches, try the following: Cool Compress. Lie down and place a cool compress on your head.  Avoid headache triggers. If certain foods or odors seem to have triggered your migraines in the past, avoid them. A headache diary might help you identify triggers.  Include physical activity in your daily routine. Try a daily walk or other moderate aerobic exercise.  Manage stress. Find healthy ways to cope with the stressors, such as delegating tasks on your to-do list.  Practice relaxation techniques. Try deep breathing, yoga, massage and visualization.  Eat regularly. Eating regularly scheduled meals and maintaining a healthy diet might help prevent headaches. Also, drink plenty of fluids.  Follow a regular sleep schedule. Sleep deprivation might contribute to headaches Consider biofeedback. With this mind-body technique, you learn to control certain bodily functions -- such as muscle tension, heart rate and blood pressure -- to prevent headaches or reduce headache pain.    Proceed to emergency room if you experience new or worsening symptoms or symptoms do not resolve, if you have new neurologic symptoms or if headache is severe, or for any concerning symptom.   Provided education and documentation from American headache Society toolbox including articles on: chronic migraine medication overuse headache, chronic migraines, prevention of migraines, behavioral and other nonpharmacologic treatments for headache.  Cc: Dr. Truman Hayward, Joylene Igo, Dr. Dierdre Harness

## 2019-12-30 NOTE — Patient Instructions (Addendum)
MRI lumbar spine EMG/NCS  XRAY of the left knee and an ultrasound    Common Peroneal Nerve Entrapment  Common peroneal nerve entrapment is a condition that can make it hard to lift a foot. The condition results from pressure on a nerve in the lower leg called the common peroneal nerve. Your common peroneal nerve provides feeling to your outer lower leg and foot. It also supplies the muscles that move your foot and toes upward and outward. What are the causes? This condition may be caused by:  Sitting cross-legged, squatting, or kneeling for long periods of time.  A hard, direct hit to the side of the lower leg.  Swelling from a knee injury.  A break (fracture) in one of the lower leg bones.  Wearing a boot or cast that ends just below the knee.  A growth or cyst near the nerve. What increases the risk? This condition is more likely to develop in people who play:  Contact sports, such as football or hockey.  Sports where you wear high and stiff boots, such as skiing. What are the signs or symptoms? Symptoms of this condition include:  Trouble lifting your foot up (foot drop).  Tripping often.  Your foot hitting the ground harder than normal as you walk.  Numbness, tingling, or pain in the outside of the knee, outside of the lower leg, and top of the foot.  Sensitivity to pressure on the front or side of the leg. How is this diagnosed? This condition may be diagnosed based on:  Your symptoms.  Your medical history.  A physical exam.  Tests, such as: ? An X-ray to check the bones of your knee and leg. ? MRI to check tendons that attach to the side of your knee. ? An ultrasound to check for a growth or cyst. ? An electromyogram (EMG) to check your nerves. During your physical exam, your health care provider will check for numbness in your leg and test the strength of your lower leg muscles. He or she may tap the side of your lower leg to see if that causes  tingling. How is this treated? Treatment for this condition may include:  Avoiding activities that make symptoms worse.  Using a brace to hold up your foot and toes.  Taking anti-inflammatory pain medicines to relieve swelling and lessen pain.  Having medicines injected into your ankle joint to lessen pain and swelling.  Doing exercises to help you regain or maintain movement (physical therapy).  Surgery to take pressure off the nerve. This may be needed if there is no improvement after 2-3 months or if there is a growth pushing on the nerve.  Returning gradually to full activity. Follow these instructions at home: If you have a brace:  Wear it as told by your health care provider. Remove it only as told by your health care provider.  Loosen the brace if your toes tingle, become numb, or turn cold and blue.  Keep the brace clean.  If the brace is not waterproof: ? Do not let it get wet. ? Cover it with a watertight covering when you take a bath or a shower.  Ask your health care provider when it is safe to drive with a brace on your foot. Activity  Return to your normal activities as told by your health care provider. Ask your health care provider what activities are safe for you.  Do not do any activities that make pain or swelling worse.  Do  exercises as told by your health care provider. General instructions  Take over-the-counter and prescription medicines only as told by your health care provider.  Do not put your full weight on your knee until your health care provider says you can. Use crutches as directed by your health care provider.  Keep all follow-up visits as told by your health care provider. This is important. How is this prevented?  Wear supportive footwear that is appropriate for your athletic activity.  Avoid athletic activities that cause ankle pain or swelling.  Wear protective padding over your lower legs when playing contact sports.  Make  sure your boots do not put extra pressure on the area just below your knees.  Do not sit cross-legged for long periods of time. Contact a health care provider if:  Your symptoms do not get better in 2-3 months.  The weakness or numbness in your leg or foot gets worse. Summary  Common peroneal nerve entrapment is a condition that results from pressure on a nerve in the lower leg called the common peroneal nerve.  This condition may be caused by a hard hit, swelling, a fracture, or a cyst in the lower leg.  Treatment may include rest, a brace, medicines, and physical therapy. Sometimes surgery is needed.  Do not do any activities that make pain or swelling worse. This information is not intended to replace advice given to you by your health care provider. Make sure you discuss any questions you have with your health care provider. Document Revised: 09/09/2018 Document Reviewed: 09/09/2018 Elsevier Patient Education  2020 [employer].

## 2020-01-06 ENCOUNTER — Other Ambulatory Visit: Payer: Self-pay | Admitting: *Deleted

## 2020-01-06 DIAGNOSIS — G5732 Lesion of lateral popliteal nerve, left lower limb: Secondary | ICD-10-CM

## 2020-01-06 NOTE — Addendum Note (Signed)
Addended by: Sarina Ill B on: 01/06/2020 11:09 AM   Modules accepted: Orders

## 2020-01-08 ENCOUNTER — Telehealth: Payer: Self-pay | Admitting: Neurology

## 2020-01-08 NOTE — Telephone Encounter (Signed)
no to the covid questions MR Lumbar spine wo contrast Dr. Ihor Dow Auth: [id number] (exp. 01/08/20 to 03/07/20). Patient is scheduled at [facility] for 01/14/20.

## 2020-01-08 NOTE — Telephone Encounter (Signed)
Can you advise on the status of auth for MRI? Is GI responsible for this one? Thanks.

## 2020-01-08 NOTE — Telephone Encounter (Signed)
Patient called to inform that her leg pain is moving up and getting worst.

## 2020-01-08 NOTE — Telephone Encounter (Signed)
Per Raquel Sarna, MRI approved.

## 2020-01-08 NOTE — Telephone Encounter (Signed)
I spoke with the patient and let her know that we had received her message and that I had been I touch with MRI referrals. I let the pt know her MRI was approved today and that she would be getting a separate call to schedule. I let her know I had addressed her call with Dr. Jaynee Eagles and that we have the pending MRI and EMG but otherwise we have to wait, nothing further at this time. The pt verbalized understanding. I let her know if we have a sooner EMG next thurs I can let her know. Pt said there is tingling at her ankle even with her leg down while driving. Pt asked about the Gabapentin she has listed on her med list. Pt advised ok to take as prescribed as this was already noted in Dr. Cathren Laine note that pt is on this for pain. Pt verbalized appreciation for the call.

## 2020-01-11 ENCOUNTER — Other Ambulatory Visit: Payer: Self-pay | Admitting: Allergy and Immunology

## 2020-01-13 ENCOUNTER — Ambulatory Visit (HOSPITAL_COMMUNITY): Payer: BC Managed Care – PPO

## 2020-01-14 ENCOUNTER — Other Ambulatory Visit: Payer: BC Managed Care – PPO

## 2020-01-15 ENCOUNTER — Telehealth: Payer: Self-pay | Admitting: Neurology

## 2020-01-15 ENCOUNTER — Ambulatory Visit (HOSPITAL_COMMUNITY): Admission: RE | Admit: 2020-01-15 | Payer: BC Managed Care – PPO | Source: Ambulatory Visit

## 2020-01-15 ENCOUNTER — Ambulatory Visit (HOSPITAL_COMMUNITY): Payer: BC Managed Care – PPO

## 2020-01-15 DIAGNOSIS — M159 Polyosteoarthritis, unspecified: Secondary | ICD-10-CM | POA: Diagnosis not present

## 2020-01-15 DIAGNOSIS — G609 Hereditary and idiopathic neuropathy, unspecified: Secondary | ICD-10-CM | POA: Diagnosis not present

## 2020-01-15 DIAGNOSIS — G894 Chronic pain syndrome: Secondary | ICD-10-CM | POA: Diagnosis not present

## 2020-01-15 DIAGNOSIS — I82409 Acute embolism and thrombosis of unspecified deep veins of unspecified lower extremity: Secondary | ICD-10-CM | POA: Diagnosis not present

## 2020-01-15 NOTE — Telephone Encounter (Signed)
Called patient x4 and left a message for her to call me back . Patient's apt is CX for today . Patient has been rescheduled 01/16/2020 at [facility] arrive at 1:45 apt at 2:00 .

## 2020-01-15 NOTE — Telephone Encounter (Signed)
I spoke to her and she is aware of her apt. On Friday . 01/16/2020.

## 2020-01-16 ENCOUNTER — Ambulatory Visit (HOSPITAL_COMMUNITY)
Admission: RE | Admit: 2020-01-16 | Discharge: 2020-01-16 | Disposition: A | Discharge status: Home / Self Care | Payer: BC Managed Care – PPO | Source: Ambulatory Visit | Attending: Neurology | Admitting: Neurology

## 2020-01-16 ENCOUNTER — Other Ambulatory Visit: Payer: Self-pay

## 2020-01-16 ENCOUNTER — Encounter (HOSPITAL_COMMUNITY): Payer: Self-pay

## 2020-01-16 ENCOUNTER — Telehealth: Payer: Self-pay | Admitting: Neurology

## 2020-01-16 DIAGNOSIS — G5732 Lesion of lateral popliteal nerve, left lower limb: Secondary | ICD-10-CM

## 2020-01-16 NOTE — Telephone Encounter (Signed)
Noted, the appointment has been canceled.

## 2020-01-16 NOTE — Telephone Encounter (Signed)
Nina Wood fyi I guess we can go ahead and cancel it thanks

## 2020-01-16 NOTE — Telephone Encounter (Signed)
I received a call from Ultrasound regarding leg ultrasound for peroneal neuropathy.  They don't have protocols to evaluate the peroneal nerve so study will need to be canceled.

## 2020-01-21 NOTE — Telephone Encounter (Signed)
Dr. Jaynee Eagles aware and will address this at the EMG tomorrow.

## 2020-01-21 NOTE — Telephone Encounter (Signed)
During reminder call for 03/11 appointment pt expressed frustration regarding her ultrasound appointment that had ended up being cancelled. Pt is requesting that someone gives her a call to answer her questions. Please advise.

## 2020-01-22 ENCOUNTER — Telehealth: Payer: Self-pay | Admitting: Neurology

## 2020-01-22 ENCOUNTER — Encounter: Payer: Self-pay | Admitting: Neurology

## 2020-01-22 ENCOUNTER — Encounter: Payer: BC Managed Care – PPO | Admitting: Neurology

## 2020-01-22 NOTE — Telephone Encounter (Signed)
Patient called back requesting to speak to an Glass blower/designer stating she wishes to put a complaint about scheduling. Patient was placed on brief hold and advised office managers are unavailable but she can be transferred to one of the managers VM and they will return her call. Pt explained she was highly upset and is not happy and wishes to speak to someone today and began cursing in regards to the practice and staff and requested a CB from RN to further discuss the issues and states if she doesn't receive a call she will have to reach out to other offices/boards and pt proceeded to terminate call.

## 2020-01-22 NOTE — Telephone Encounter (Signed)
Patient arrived at 2:00 for her apt which was sch for 10 am. She was incredibly rude while trying to get her rescheduled, kept talking over me and saying that she had paperwork stating it was 2:00 and she was sick of this office making mistakes and how many more mistakes was she going to through as we got her "ultrasound" all wrong too. She wants a call back from Dr. Jaynee Eagles to express her frustration. (of note, she got a reminder call yesterday from Guy Begin that the apt check in was 9:30 am and she was very mean/rude on that phone call as well). Best call back is [phone]

## 2020-02-09 NOTE — Telephone Encounter (Signed)
I called the patient's cell phone number no answer and no voicemail set up.  I will try to call her again tomorrow

## 2020-02-09 NOTE — Telephone Encounter (Signed)
Dayton Martes called requesting a CB from Glass blower/designer in regards to letter she received and scheduling errors.

## 2020-02-11 NOTE — Telephone Encounter (Signed)
I called the patient no answer on her mobile.  I called the home number and was able to leave a message.

## 2020-02-12 DIAGNOSIS — G894 Chronic pain syndrome: Secondary | ICD-10-CM | POA: Diagnosis not present

## 2020-02-12 DIAGNOSIS — I82409 Acute embolism and thrombosis of unspecified deep veins of unspecified lower extremity: Secondary | ICD-10-CM | POA: Diagnosis not present

## 2020-02-12 DIAGNOSIS — G609 Hereditary and idiopathic neuropathy, unspecified: Secondary | ICD-10-CM | POA: Diagnosis not present

## 2020-02-12 DIAGNOSIS — Z1331 Encounter for screening for depression: Secondary | ICD-10-CM | POA: Diagnosis not present

## 2020-02-12 DIAGNOSIS — M159 Polyosteoarthritis, unspecified: Secondary | ICD-10-CM | POA: Diagnosis not present

## 2020-02-19 ENCOUNTER — Encounter: Payer: BC Managed Care – PPO | Admitting: Neurology

## 2020-02-19 ENCOUNTER — Ambulatory Visit: Payer: BC Managed Care – PPO | Admitting: Family Medicine

## 2020-02-26 ENCOUNTER — Ambulatory Visit: Payer: BC Managed Care – PPO | Admitting: Neurology

## 2020-03-09 ENCOUNTER — Other Ambulatory Visit: Payer: Self-pay | Admitting: Neurosurgery

## 2020-03-09 DIAGNOSIS — R102 Pelvic and perineal pain: Secondary | ICD-10-CM | POA: Diagnosis not present

## 2020-03-09 DIAGNOSIS — N83201 Unspecified ovarian cyst, right side: Secondary | ICD-10-CM | POA: Diagnosis not present

## 2020-03-09 DIAGNOSIS — D496 Neoplasm of unspecified behavior of brain: Secondary | ICD-10-CM

## 2020-03-09 DIAGNOSIS — Z3202 Encounter for pregnancy test, result negative: Secondary | ICD-10-CM | POA: Diagnosis not present

## 2020-03-09 DIAGNOSIS — D251 Intramural leiomyoma of uterus: Secondary | ICD-10-CM | POA: Diagnosis not present

## 2020-03-11 DIAGNOSIS — G894 Chronic pain syndrome: Secondary | ICD-10-CM | POA: Diagnosis not present

## 2020-03-11 DIAGNOSIS — M159 Polyosteoarthritis, unspecified: Secondary | ICD-10-CM | POA: Diagnosis not present

## 2020-03-11 DIAGNOSIS — G609 Hereditary and idiopathic neuropathy, unspecified: Secondary | ICD-10-CM | POA: Diagnosis not present

## 2020-03-11 DIAGNOSIS — I82409 Acute embolism and thrombosis of unspecified deep veins of unspecified lower extremity: Secondary | ICD-10-CM | POA: Diagnosis not present

## 2020-03-22 DIAGNOSIS — D496 Neoplasm of unspecified behavior of brain: Secondary | ICD-10-CM | POA: Diagnosis not present

## 2020-03-24 ENCOUNTER — Other Ambulatory Visit: Payer: Self-pay | Admitting: Radiation Therapy

## 2020-03-24 DIAGNOSIS — M329 Systemic lupus erythematosus, unspecified: Secondary | ICD-10-CM | POA: Diagnosis not present

## 2020-03-24 DIAGNOSIS — D496 Neoplasm of unspecified behavior of brain: Secondary | ICD-10-CM | POA: Diagnosis not present

## 2020-03-24 DIAGNOSIS — G43919 Migraine, unspecified, intractable, without status migrainosus: Secondary | ICD-10-CM | POA: Diagnosis not present

## 2020-04-08 ENCOUNTER — Ambulatory Visit: Payer: BC Managed Care – PPO | Admitting: Internal Medicine

## 2020-04-08 DIAGNOSIS — G894 Chronic pain syndrome: Secondary | ICD-10-CM | POA: Diagnosis not present

## 2020-04-08 DIAGNOSIS — M159 Polyosteoarthritis, unspecified: Secondary | ICD-10-CM | POA: Diagnosis not present

## 2020-04-08 DIAGNOSIS — I82409 Acute embolism and thrombosis of unspecified deep veins of unspecified lower extremity: Secondary | ICD-10-CM | POA: Diagnosis not present

## 2020-04-08 DIAGNOSIS — G609 Hereditary and idiopathic neuropathy, unspecified: Secondary | ICD-10-CM | POA: Diagnosis not present

## 2020-04-10 ENCOUNTER — Other Ambulatory Visit: Payer: BC Managed Care – PPO

## 2020-04-15 ENCOUNTER — Other Ambulatory Visit: Payer: Self-pay

## 2020-04-15 ENCOUNTER — Telehealth: Payer: Self-pay | Admitting: Internal Medicine

## 2020-04-15 ENCOUNTER — Inpatient Hospital Stay: Payer: BC Managed Care – PPO | Attending: Internal Medicine | Admitting: Internal Medicine

## 2020-04-15 VITALS — BP 124/82 | HR 75 | Temp 97.4°F | Resp 17 | Ht <= 58 in | Wt 191.3 lb

## 2020-04-15 DIAGNOSIS — I10 Essential (primary) hypertension: Secondary | ICD-10-CM | POA: Insufficient documentation

## 2020-04-15 DIAGNOSIS — R5383 Other fatigue: Secondary | ICD-10-CM | POA: Insufficient documentation

## 2020-04-15 DIAGNOSIS — Z79899 Other long term (current) drug therapy: Secondary | ICD-10-CM | POA: Diagnosis not present

## 2020-04-15 DIAGNOSIS — R0683 Snoring: Secondary | ICD-10-CM | POA: Insufficient documentation

## 2020-04-15 DIAGNOSIS — G43709 Chronic migraine without aura, not intractable, without status migrainosus: Secondary | ICD-10-CM | POA: Insufficient documentation

## 2020-04-15 DIAGNOSIS — R9089 Other abnormal findings on diagnostic imaging of central nervous system: Secondary | ICD-10-CM | POA: Diagnosis not present
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
## 2020-04-15 NOTE — Telephone Encounter (Signed)
Scheduled per 06/03 los, patient will be notified per My chart.

## 2020-04-17 ENCOUNTER — Encounter: Payer: Self-pay | Admitting: Internal Medicine

## 2020-04-17 ENCOUNTER — Other Ambulatory Visit: Payer: Self-pay | Admitting: Neurology

## 2020-04-17 DIAGNOSIS — G43709 Chronic migraine without aura, not intractable, without status migrainosus: Secondary | ICD-10-CM

## 2020-04-19 ENCOUNTER — Inpatient Hospital Stay: Payer: BC Managed Care – PPO

## 2020-04-22 ENCOUNTER — Other Ambulatory Visit: Payer: Self-pay | Admitting: *Deleted

## 2020-04-22 DIAGNOSIS — R0683 Snoring: Secondary | ICD-10-CM

## 2020-05-01 ENCOUNTER — Encounter (HOSPITAL_BASED_OUTPATIENT_CLINIC_OR_DEPARTMENT_OTHER): Payer: Self-pay

## 2020-05-03 ENCOUNTER — Other Ambulatory Visit: Payer: Self-pay | Admitting: Internal Medicine

## 2020-05-03 ENCOUNTER — Encounter: Payer: Self-pay | Admitting: Internal Medicine

## 2020-05-03 DIAGNOSIS — G43709 Chronic migraine without aura, not intractable, without status migrainosus: Secondary | ICD-10-CM

## 2020-05-03 MED ORDER — AJOVY 225 MG/1.5ML ~~LOC~~ SOSY: PREFILLED_SYRINGE | SUBCUTANEOUS | 11 refills | Status: DC

## 2020-05-03 NOTE — Telephone Encounter (Signed)
mychart message sent by pt stating that she sent this to the wrong provider. Dr. Annamaria Boots, please disregard.

## 2020-05-03 NOTE — Telephone Encounter (Signed)
Dr. Annamaria Boots, please see pt's mychart message and advise if you are okay with the Ajvoy being refilled. Pt also has attachments showing in the review media section of her mychart message as well.  Allergies  Allergen Reactions  . Fish Allergy Anaphylaxis  . Other Swelling    Mayonnaise causes swelling of the tongue.  Marland Kitchen Penicillins Hives    Has patient had a PCN reaction causing immediate rash, facial/tongue/throat swelling, SOB or lightheadedness with hypotension: Yes Has patient had a PCN reaction causing severe rash involving mucus membranes or skin necrosis: No Has patient had a PCN reaction that required hospitalization No Has patient had a PCN reaction occurring within the last 10 years: Yes If all of the above answers are "NO", then may proceed with Cephalosporin use.    . Shellfish Allergy Anaphylaxis    Patient is allergic to all seafood.  She states she can take the dye that is given in CT scans even though she has some itching   . Peanut Butter Flavor Hives  . Histamine Other (See Comments)    Patient  tested positive in allergy testing to Control-histamine 1.  . Voltaren [Diclofenac] Rash and Other (See Comments)    Volatren gel causes blisters.     Current Outpatient Medications:  .  AJOVY 225 MG/1.5ML SOSY, INJECT 225 MG UNDER THE SKIN EVERY 30 DAYS, Disp: 1.5 mL, Rfl: 11 .  albuterol (PROAIR HFA) 108 (90 Base) MCG/ACT inhaler, Inhale 2 puffs into the lungs every 6 (six) hours as needed for wheezing or shortness of breath., Disp: , Rfl:  .  cetirizine (ZYRTEC) 10 MG tablet, Take 20-30 mg by mouth at bedtime. , Disp: , Rfl:  .  clonazePAM (KLONOPIN) 0.5 MG tablet, Take 0.5 mg by mouth as needed., Disp: , Rfl:  .  EPINEPHRINE 0.3 mg/0.3 mL IJ SOAJ injection, USE AS DIRECTED FOR LIFE THREATENING ALLERGIC REACTIONS, Disp: 4 each, Rfl: 0 .  etonogestrel (NEXPLANON) 68 MG IMPL implant, 1 each by Subdermal route., Disp: , Rfl:  .  Fluticasone Furoate 50 MCG/ACT AEPB, Inhale 2  sprays into the lungs daily as needed. , Disp: , Rfl:  .  Fremanezumab-vfrm (AJOVY) 225 MG/1.5ML SOSY, Inject 225 mg into the skin every 30 (thirty) days., Disp: 1 Syringe, Rfl: 0 .  gabapentin (NEURONTIN) 800 MG tablet, Take 400-800 mg by mouth at bedtime as needed. , Disp: , Rfl:  .  HYDROcodone-acetaminophen (NORCO) 7.5-325 MG tablet, Take 0.5-1 tablets by mouth every 6 (six) hours as needed for moderate pain., Disp: , Rfl:  .  Hyprom-Naphaz-Polysorb-Zn Sulf (CLEAR EYES COMPLETE OP), Place 1 drop into both eyes 2 (two) times daily as needed (dry eyes/itching). , Disp: , Rfl:  .  ibuprofen (ADVIL,MOTRIN) 800 MG tablet, TK 1 T PO  BID PRN, Disp: , Rfl:  .  meloxicam (MOBIC) 7.5 MG tablet, Take 7.5 mg by mouth 2 (two) times daily as needed., Disp: , Rfl:  .  metoCLOPramide (REGLAN) 10 MG tablet, Take 1 tablet (10 mg total) by mouth every 6 (six) hours as needed for nausea., Disp: 30 tablet, Rfl: 6 .  montelukast (SINGULAIR) 10 MG tablet, TAKE 1 TABLET BY MOUTH EVERY DAY, Disp: 30 tablet, Rfl: 11 .  Olopatadine HCl (PAZEO) 0.7 % SOLN, Apply 1 drop to eye daily., Disp: , Rfl:  .  orphenadrine (NORFLEX) 100 MG tablet, Take 100 mg by mouth 2 (two) times daily as needed for muscle spasms., Disp: , Rfl:  .  rizatriptan (MAXALT-MLT) 10  MG disintegrating tablet, Take 1 tablet (10 mg total) by mouth as needed for migraine. May repeat in 2 hours if needed, Disp: 9 tablet, Rfl: 11 .  triamcinolone cream (KENALOG) 0.5 %, Apply 1 application topically every 4 (four) hours as needed (rash/allergies). , Disp: , Rfl:

## 2020-05-03 NOTE — Telephone Encounter (Signed)
New mychart message sent by pt stating that she sent this refill request to wrong provider. Please disregard Dr. Annamaria Boots.

## 2020-05-03 NOTE — Telephone Encounter (Signed)
Dr. Annamaria Boots, please see mychart message from pt and advise.

## 2020-05-06 DIAGNOSIS — G609 Hereditary and idiopathic neuropathy, unspecified: Secondary | ICD-10-CM | POA: Diagnosis not present

## 2020-05-06 DIAGNOSIS — N83201 Unspecified ovarian cyst, right side: Secondary | ICD-10-CM | POA: Diagnosis not present

## 2020-05-06 DIAGNOSIS — M159 Polyosteoarthritis, unspecified: Secondary | ICD-10-CM | POA: Diagnosis not present

## 2020-05-06 DIAGNOSIS — R102 Pelvic and perineal pain: Secondary | ICD-10-CM | POA: Diagnosis not present

## 2020-05-06 DIAGNOSIS — G894 Chronic pain syndrome: Secondary | ICD-10-CM | POA: Diagnosis not present

## 2020-05-06 DIAGNOSIS — I82409 Acute embolism and thrombosis of unspecified deep veins of unspecified lower extremity: Secondary | ICD-10-CM | POA: Diagnosis not present

## 2020-05-06 DIAGNOSIS — D219 Benign neoplasm of connective and other soft tissue, unspecified: Secondary | ICD-10-CM | POA: Diagnosis not present

## 2020-05-18 ENCOUNTER — Ambulatory Visit (HOSPITAL_BASED_OUTPATIENT_CLINIC_OR_DEPARTMENT_OTHER): Payer: BC Managed Care – PPO | Admitting: Internal Medicine

## 2020-05-18 ENCOUNTER — Encounter: Payer: Self-pay | Admitting: Internal Medicine

## 2020-05-18 ENCOUNTER — Encounter (HOSPITAL_BASED_OUTPATIENT_CLINIC_OR_DEPARTMENT_OTHER): Payer: Self-pay

## 2020-05-18 ENCOUNTER — Other Ambulatory Visit: Payer: Self-pay

## 2020-06-02 ENCOUNTER — Ambulatory Visit (HOSPITAL_BASED_OUTPATIENT_CLINIC_OR_DEPARTMENT_OTHER): Payer: BC Managed Care – PPO | Attending: Internal Medicine | Admitting: Internal Medicine

## 2020-06-02 ENCOUNTER — Encounter: Payer: Self-pay | Admitting: Internal Medicine

## 2020-06-02 ENCOUNTER — Other Ambulatory Visit: Payer: Self-pay

## 2020-06-02 DIAGNOSIS — R0683 Snoring: Secondary | ICD-10-CM | POA: Diagnosis not present

## 2020-06-05 DIAGNOSIS — I82409 Acute embolism and thrombosis of unspecified deep veins of unspecified lower extremity: Secondary | ICD-10-CM | POA: Diagnosis not present

## 2020-06-05 DIAGNOSIS — G609 Hereditary and idiopathic neuropathy, unspecified: Secondary | ICD-10-CM | POA: Diagnosis not present

## 2020-06-05 DIAGNOSIS — M159 Polyosteoarthritis, unspecified: Secondary | ICD-10-CM | POA: Diagnosis not present

## 2020-06-05 DIAGNOSIS — G894 Chronic pain syndrome: Secondary | ICD-10-CM | POA: Diagnosis not present

## 2020-06-08 DIAGNOSIS — G8929 Other chronic pain: Secondary | ICD-10-CM | POA: Diagnosis not present

## 2020-06-08 DIAGNOSIS — R102 Pelvic and perineal pain: Secondary | ICD-10-CM | POA: Diagnosis not present

## 2020-06-12 DIAGNOSIS — R0683 Snoring: Secondary | ICD-10-CM | POA: Diagnosis not present

## 2020-06-12 NOTE — Procedures (Signed)
    Patient Name: Nina Wood, Nina Wood Date: 06/02/2020 Gender: Female D.O.B: 1986/05/16 Age (years): 33 Referring Provider: Ventura Sellers Height (inches): 62 Interpreting Physician: Baird Lyons MD, ABSM Weight (lbs): 191 RPSGT: Gerhard Perches BMI: 44 MRN: [id number] Neck Size: 13.00  CLINICAL INFORMATION Sleep Study Type: HST Indication for sleep study: OSA Epworth Sleepiness Score: N/A  SLEEP STUDY TECHNIQUE A multi-channel overnight portable sleep study was performed. The channels recorded were: nasal airflow, thoracic respiratory movement, and oxygen saturation with a pulse oximetry. Snoring was also monitored.  MEDICATIONS Patient self administered medications include: none reported.  SLEEP ARCHITECTURE Patient was studied for 420.9 minutes. The sleep efficiency was 100.0 % and the patient was supine for 0%. The arousal index was 0.0 per hour.  RESPIRATORY PARAMETERS The overall AHI was 1.9 per hour, with a central apnea index of 0.0 per hour. The oxygen nadir was 91% during sleep.  CARDIAC DATA Mean heart rate during sleep was 70.8 bpm.  IMPRESSIONS - No significant obstructive sleep apnea occurred during this study (AHI = 1.9/h). - No significant central sleep apnea occurred during this study (CAI = 0.0/h). - The patient had minimal or no oxygen desaturation during the study (Min O2 = 91%) - Patient snored.  DIAGNOSIS - Normal study - Primary Snoring  RECOMMENDATIONS - Manage for snoring and symptoms based on clinical judgment. - Be careful with alcohol, sedatives and other CNS depressants that may worsen sleep apnea and disrupt normal sleep architecture. - Sleep hygiene should be reviewed to assess factors that may improve sleep quality. - Weight management and regular exercise should be initiated or continued.  [Electronically signed] 06/12/2020 09:41 AM  Baird Lyons MD, ABSM Diplomate, American Board of Sleep Medicine   NPI: [id number]                          [place], [place] [facility]  ELECTRONICALLY SIGNED ON:  06/12/2020, 9:39 AM [place] PH: [phone]   FX: [phone] [place]

## 2020-06-17 ENCOUNTER — Ambulatory Visit: Payer: BC Managed Care – PPO | Admitting: Internal Medicine

## 2020-06-18 ENCOUNTER — Inpatient Hospital Stay: Payer: BC Managed Care – PPO | Attending: Internal Medicine | Admitting: Internal Medicine

## 2020-06-18 ENCOUNTER — Other Ambulatory Visit: Payer: Self-pay

## 2020-06-18 VITALS — BP 107/73 | HR 66 | Temp 97.7°F | Resp 18 | Ht <= 58 in | Wt 190.8 lb

## 2020-06-18 DIAGNOSIS — M25519 Pain in unspecified shoulder: Secondary | ICD-10-CM | POA: Diagnosis not present

## 2020-06-18 DIAGNOSIS — Z791 Long term (current) use of non-steroidal anti-inflammatories (NSAID): Secondary | ICD-10-CM | POA: Diagnosis not present

## 2020-06-18 DIAGNOSIS — Z86718 Personal history of other venous thrombosis and embolism: Secondary | ICD-10-CM | POA: Diagnosis not present

## 2020-06-18 DIAGNOSIS — F439 Reaction to severe stress, unspecified: Secondary | ICD-10-CM | POA: Diagnosis not present

## 2020-06-18 DIAGNOSIS — G43709 Chronic migraine without aura, not intractable, without status migrainosus: Secondary | ICD-10-CM | POA: Insufficient documentation

## 2020-06-18 DIAGNOSIS — Z79899 Other long term (current) drug therapy: Secondary | ICD-10-CM | POA: Diagnosis not present

## 2020-06-18 DIAGNOSIS — Z20822 Contact with and (suspected) exposure to covid-19: Secondary | ICD-10-CM | POA: Diagnosis not present

## 2020-06-18 DIAGNOSIS — K219 Gastro-esophageal reflux disease without esophagitis: Secondary | ICD-10-CM | POA: Insufficient documentation

## 2020-06-18 DIAGNOSIS — K589 Irritable bowel syndrome without diarrhea: Secondary | ICD-10-CM | POA: Insufficient documentation

## 2020-06-18 DIAGNOSIS — R102 Pelvic and perineal pain: Secondary | ICD-10-CM | POA: Diagnosis not present

## 2020-06-18 DIAGNOSIS — Z01812 Encounter for preprocedural laboratory examination: Secondary | ICD-10-CM | POA: Diagnosis not present

## 2020-06-18 DIAGNOSIS — F419 Anxiety disorder, unspecified: Secondary | ICD-10-CM | POA: Diagnosis not present

## 2020-06-18 DIAGNOSIS — G629 Polyneuropathy, unspecified: Secondary | ICD-10-CM | POA: Insufficient documentation

## 2020-06-18 DIAGNOSIS — R9089 Other abnormal findings on diagnostic imaging of central nervous system: Secondary | ICD-10-CM | POA: Diagnosis not present

## 2020-06-18 DIAGNOSIS — M797 Fibromyalgia: Secondary | ICD-10-CM | POA: Insufficient documentation

## 2020-06-18 DIAGNOSIS — G8929 Other chronic pain: Secondary | ICD-10-CM | POA: Diagnosis not present

## 2020-06-18 DIAGNOSIS — J45909 Unspecified asthma, uncomplicated: Secondary | ICD-10-CM | POA: Diagnosis not present

## 2020-06-18 MED ORDER — PREGABALIN 75 MG PO CAPS: 75.0000 mg | ORAL_CAPSULE | Freq: Two times a day (BID) | ORAL | 3 refills | Status: DC
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
## 2020-06-21 ENCOUNTER — Telehealth: Payer: Self-pay | Admitting: Internal Medicine

## 2020-06-21 NOTE — Telephone Encounter (Signed)
Scheduled per 8/6 los. Unable to reach pt. Left voicemail with appt time and date. 

## 2020-06-23 HISTORY — PX: MYOMECTOMY: SHX85

## 2020-06-24 DIAGNOSIS — D259 Leiomyoma of uterus, unspecified: Secondary | ICD-10-CM | POA: Diagnosis not present

## 2020-06-24 DIAGNOSIS — N83201 Unspecified ovarian cyst, right side: Secondary | ICD-10-CM | POA: Diagnosis not present

## 2020-06-24 DIAGNOSIS — R102 Pelvic and perineal pain: Secondary | ICD-10-CM | POA: Diagnosis not present

## 2020-06-24 DIAGNOSIS — N83291 Other ovarian cyst, right side: Secondary | ICD-10-CM | POA: Diagnosis not present

## 2020-06-24 DIAGNOSIS — Z79899 Other long term (current) drug therapy: Secondary | ICD-10-CM | POA: Diagnosis not present

## 2020-06-24 DIAGNOSIS — D252 Subserosal leiomyoma of uterus: Secondary | ICD-10-CM | POA: Diagnosis not present

## 2020-06-24 DIAGNOSIS — G8929 Other chronic pain: Secondary | ICD-10-CM | POA: Diagnosis not present

## 2020-06-24 DIAGNOSIS — N800 Endometriosis of uterus: Secondary | ICD-10-CM | POA: Diagnosis not present

## 2020-06-24 DIAGNOSIS — M797 Fibromyalgia: Secondary | ICD-10-CM | POA: Diagnosis not present

## 2020-06-24 DIAGNOSIS — D649 Anemia, unspecified: Secondary | ICD-10-CM | POA: Diagnosis not present

## 2020-06-24 DIAGNOSIS — Z86718 Personal history of other venous thrombosis and embolism: Secondary | ICD-10-CM | POA: Diagnosis not present

## 2020-06-24 DIAGNOSIS — Z6841 Body Mass Index (BMI) 40.0 and over, adult: Secondary | ICD-10-CM | POA: Diagnosis not present

## 2020-06-30 ENCOUNTER — Telehealth: Payer: Self-pay | Admitting: Internal Medicine

## 2020-07-01 ENCOUNTER — Inpatient Hospital Stay (HOSPITAL_BASED_OUTPATIENT_CLINIC_OR_DEPARTMENT_OTHER): Payer: BC Managed Care – PPO | Admitting: Internal Medicine

## 2020-07-01 DIAGNOSIS — G43709 Chronic migraine without aura, not intractable, without status migrainosus: Secondary | ICD-10-CM | POA: Diagnosis not present

## 2020-07-01 NOTE — Progress Notes (Signed)
I connected with Nina Wood on 07/01/20 at 11:30 AM EDT by telephone visit and verified that I am speaking with the correct person using two identifiers.  I discussed the limitations, risks, security and privacy concerns of performing an evaluation and management service by telemedicine and the availability of in-person appointments. I also discussed with the patient that there may be a patient responsible charge related to this service. The patient expressed understanding and agreed to proceed.  Other persons participating in the visit and their role in the encounter:  n/a  Patient's location:  Home  Provider's location:  Office  Chief Complaint:  Chronic migraine w/o aura w/o status migrainosus, not intractable  History of Present Ilness: Nina Wood has been successful in weaning off Tramadol since our visit two weeks prior.  Continues to dose aleve and norco daily or near daily.  The lyrica has "definitely helped" with the neuropathy in her leg, although it makes her drowsy during the day so she has been taking it nightly (75mg ).  Recently had uterine fibroids removed, so there is some additional post-surgical pain in addition to routine headaches and leg pain.  Observations: Language and cognition at baseline Assessment and Plan: Chronic migraine w/o aura w/o status migrainosus, not intractable  Encouraged by progress thus far in long-term plan to wean daily analgesia and improve medication overuse headache syndrome.  We are ok with increasing Lyrica to 150mg  HS given efficacy at the introductory dose level.  She will not dose Lyrica during the day.  Follow Up Instructions: RTC in 2 months   I discussed the assessment and treatment plan with the patient.  The patient was provided an opportunity to ask questions and all were answered.  The patient agreed with the plan and demonstrated understanding of the instructions.    The patient was advised to call back or seek an in-person evaluation  if the symptoms worsen or if the condition fails to improve as anticipated.  I provided 5-10 minutes of non-face-to-face time during this enocunter.  Ventura Sellers, MD   I provided 15 minutes of non face-to-face telephone visit time during this encounter, and > 50% was spent counseling as documented under my assessment & plan.

## 2020-07-02 ENCOUNTER — Telehealth: Payer: Self-pay | Admitting: Internal Medicine

## 2020-07-02 DIAGNOSIS — I82409 Acute embolism and thrombosis of unspecified deep veins of unspecified lower extremity: Secondary | ICD-10-CM | POA: Diagnosis not present

## 2020-07-02 DIAGNOSIS — G609 Hereditary and idiopathic neuropathy, unspecified: Secondary | ICD-10-CM | POA: Diagnosis not present

## 2020-07-02 DIAGNOSIS — M159 Polyosteoarthritis, unspecified: Secondary | ICD-10-CM | POA: Diagnosis not present

## 2020-07-02 DIAGNOSIS — G894 Chronic pain syndrome: Secondary | ICD-10-CM | POA: Diagnosis not present

## 2020-07-02 NOTE — Telephone Encounter (Signed)
Scheduled appointment per 8/19 los. Patient is aware of appointment date and time.

## 2020-07-06 DIAGNOSIS — R102 Pelvic and perineal pain: Secondary | ICD-10-CM | POA: Diagnosis not present

## 2020-07-06 DIAGNOSIS — G8929 Other chronic pain: Secondary | ICD-10-CM | POA: Diagnosis not present

## 2020-07-07 ENCOUNTER — Encounter: Payer: Self-pay | Admitting: Internal Medicine

## 2020-07-07 DIAGNOSIS — G43709 Chronic migraine without aura, not intractable, without status migrainosus: Secondary | ICD-10-CM

## 2020-07-08 MED ORDER — METOCLOPRAMIDE HCL 10 MG PO TABS: 10.0000 mg | ORAL_TABLET | Freq: Four times a day (QID) | ORAL | 6 refills | Status: DC | PRN

## 2020-08-02 DIAGNOSIS — I82409 Acute embolism and thrombosis of unspecified deep veins of unspecified lower extremity: Secondary | ICD-10-CM | POA: Diagnosis not present

## 2020-08-02 DIAGNOSIS — G609 Hereditary and idiopathic neuropathy, unspecified: Secondary | ICD-10-CM | POA: Diagnosis not present

## 2020-08-02 DIAGNOSIS — G894 Chronic pain syndrome: Secondary | ICD-10-CM | POA: Diagnosis not present

## 2020-08-02 DIAGNOSIS — M159 Polyosteoarthritis, unspecified: Secondary | ICD-10-CM | POA: Diagnosis not present

## 2020-09-02 ENCOUNTER — Inpatient Hospital Stay: Payer: BC Managed Care – PPO | Attending: Internal Medicine | Admitting: Internal Medicine

## 2020-09-02 ENCOUNTER — Telehealth: Payer: Self-pay | Admitting: Internal Medicine

## 2020-09-02 ENCOUNTER — Other Ambulatory Visit: Payer: Self-pay

## 2020-09-02 VITALS — BP 125/75 | HR 62 | Temp 96.7°F | Resp 18 | Ht <= 58 in | Wt 187.4 lb

## 2020-09-02 DIAGNOSIS — K219 Gastro-esophageal reflux disease without esophagitis: Secondary | ICD-10-CM | POA: Diagnosis not present

## 2020-09-02 DIAGNOSIS — F4310 Post-traumatic stress disorder, unspecified: Secondary | ICD-10-CM | POA: Diagnosis not present

## 2020-09-02 DIAGNOSIS — Z79899 Other long term (current) drug therapy: Secondary | ICD-10-CM | POA: Diagnosis not present

## 2020-09-02 DIAGNOSIS — R9089 Other abnormal findings on diagnostic imaging of central nervous system: Secondary | ICD-10-CM | POA: Diagnosis not present

## 2020-09-02 DIAGNOSIS — Z86718 Personal history of other venous thrombosis and embolism: Secondary | ICD-10-CM | POA: Diagnosis not present

## 2020-09-02 DIAGNOSIS — M25519 Pain in unspecified shoulder: Secondary | ICD-10-CM | POA: Diagnosis not present

## 2020-09-02 DIAGNOSIS — M797 Fibromyalgia: Secondary | ICD-10-CM | POA: Insufficient documentation

## 2020-09-02 DIAGNOSIS — Z791 Long term (current) use of non-steroidal anti-inflammatories (NSAID): Secondary | ICD-10-CM | POA: Insufficient documentation

## 2020-09-02 DIAGNOSIS — G43709 Chronic migraine without aura, not intractable, without status migrainosus: Secondary | ICD-10-CM | POA: Diagnosis not present

## 2020-09-02 MED ORDER — PREGABALIN 150 MG PO CAPS: 150.0000 mg | ORAL_CAPSULE | Freq: Every evening | ORAL | 3 refills | Status: DC

## 2020-09-02 MED ORDER — METOCLOPRAMIDE HCL 10 MG PO TABS: 10.0000 mg | ORAL_TABLET | Freq: Four times a day (QID) | ORAL | 6 refills | Status: DC | PRN

## 2020-09-02 NOTE — Telephone Encounter (Signed)
Scheduled appointment per 10/21 los. Spoke to patient who is aware of appointment.
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
## 2020-09-04 DIAGNOSIS — G609 Hereditary and idiopathic neuropathy, unspecified: Secondary | ICD-10-CM | POA: Diagnosis not present

## 2020-09-04 DIAGNOSIS — G894 Chronic pain syndrome: Secondary | ICD-10-CM | POA: Diagnosis not present

## 2020-09-04 DIAGNOSIS — I82409 Acute embolism and thrombosis of unspecified deep veins of unspecified lower extremity: Secondary | ICD-10-CM | POA: Diagnosis not present

## 2020-09-04 DIAGNOSIS — M159 Polyosteoarthritis, unspecified: Secondary | ICD-10-CM | POA: Diagnosis not present

## 2020-09-14 ENCOUNTER — Other Ambulatory Visit: Payer: Self-pay

## 2020-09-14 ENCOUNTER — Ambulatory Visit
Admission: RE | Admit: 2020-09-14 | Discharge: 2020-09-14 | Disposition: A | Discharge status: Home / Self Care | Payer: BC Managed Care – PPO | Source: Ambulatory Visit | Attending: Neurosurgery | Admitting: Neurosurgery

## 2020-09-14 DIAGNOSIS — D496 Neoplasm of unspecified behavior of brain: Secondary | ICD-10-CM

## 2020-09-23 ENCOUNTER — Other Ambulatory Visit: Payer: Self-pay

## 2020-09-23 ENCOUNTER — Encounter (HOSPITAL_COMMUNITY): Payer: Self-pay | Admitting: Obstetrics and Gynecology

## 2020-09-23 ENCOUNTER — Inpatient Hospital Stay (HOSPITAL_COMMUNITY)
Admission: AD | Admit: 2020-09-23 | Discharge: 2020-09-23 | Disposition: A | Discharge status: Home / Self Care | Payer: BC Managed Care – PPO | Attending: Obstetrics and Gynecology | Admitting: Obstetrics and Gynecology

## 2020-09-23 DIAGNOSIS — Z9889 Other specified postprocedural states: Secondary | ICD-10-CM | POA: Insufficient documentation

## 2020-09-23 DIAGNOSIS — R109 Unspecified abdominal pain: Secondary | ICD-10-CM | POA: Insufficient documentation

## 2020-09-23 DIAGNOSIS — R1031 Right lower quadrant pain: Secondary | ICD-10-CM | POA: Diagnosis not present

## 2020-09-23 HISTORY — DX: Post-traumatic stress disorder, unspecified: F43.10

## 2020-09-23 LAB — URINALYSIS, ROUTINE W REFLEX MICROSCOPIC
Bilirubin Urine: NEGATIVE
Glucose, UA: NEGATIVE mg/dL
Hgb urine dipstick: NEGATIVE
Ketones, ur: NEGATIVE mg/dL
Leukocytes,Ua: NEGATIVE
Nitrite: NEGATIVE
Protein, ur: NEGATIVE mg/dL
Specific Gravity, Urine: 1.025 (ref 1.005–1.030)
pH: 5 (ref 5.0–8.0)

## 2020-09-23 LAB — POCT PREGNANCY, URINE: Preg Test, Ur: NEGATIVE

## 2020-09-23 NOTE — MAU Provider Note (Signed)
History     [id number]  Arrival date and time: 09/23/20 1558    Chief Complaint  Patient presents with   Abdominal Pain     HPI Nina Wood is a 34 y.o. with PMHx notable for endometriosis, CS x2, DVT in pregnancy, chronic migraine, who presents for abdominal pain s/p surgery on 06/23/2020.   Reports that she has had R sided abdominal pain Wondering if it's from her recent surgery or possible ectopic Has been having it for four weeks Happens daily, numerous times more than she can count Usually lasts a few seconds but today lasted almost a minute before passing No associated nausea or vomiting though she does take reglan for nausea regularly secondary to brain mass/migraines No hx of kidney stones, burning or pain w urination, or fevers No vaginal discharge Reports diarrhea yesterday but today had normal BM, denies any constipation Has tried ibuprofen, was not helpful No trauma to that area Sometimes radiates from RLQ to R back side Has hx of gastric bypass in addition to recent diagnostic lap Also had appendectomy a long time ago  Review of records from [facility]where:  Appears to have had diagnostic laparoscopy, fulguration of lesion, and ovarian cyst drainage at beginning of August though op note not available Seen on 07/06/2020 for post-op visit, at that time discussed starting Edward Qualia and trialing for six months, otherwise doing well Given course of Provera in 07/2020 Called OB/GYN on 09/13/2020 reporting RLQ pain, recommended ibuprofen and ED eval if pain not improving     OB History    Gravida  10   Para  2   Term  2   Preterm  0   AB  7   Living  2     SAB  5   TAB  1   Ectopic  1   Multiple  0   Live Births              Past Medical History:  Diagnosis Date   Allergic rhinitis    Allergy to cats    Allergy to dogs    Anemia    Anxiety    Asthma    BMI 37.0-37.9, adult    Breast cyst    Chronic pain syndrome    Deep  vein thrombosis (DVT) ([facility])    Endometriosis, uterus    Environmental allergies    Fibromyalgia syndrome    Generalized osteoarthrosis    GERD (gastroesophageal reflux disease)    Headache    Hip discomfort    IBS (irritable bowel syndrome)    with diarrhea   Obesity    Ovarian cyst    Paroxysmal dyspnea    PTSD (post-traumatic stress disorder)    Patient reported   Uterine fibroid     Past Surgical History:  Procedure Laterality Date   APPENDECTOMY  2003   CESAREAN SECTION     DILATION AND CURETTAGE OF UTERUS     1 WEEK AGO   DILATION AND EVACUATION Bilateral 05/18/2016   Procedure: DILATATION AND EVACUATION;  Surgeon: Bobbye Charleston, MD;  Location: [facility];  Service: Gynecology;  Laterality: Bilateral;   ECTOPIC PREGNANCY SURGERY     etopi     GASTRIC BYPASS  03/14/2012   INDUCED ABORTION     MYOMECTOMY  06/23/2020   OVARIAN CYST SURGERY      Family History  Problem Relation Age of Onset   Hypertension Mother    Breast cancer Mother    Prostate cancer  Father    Hypertension Maternal Uncle    Diabetes Paternal Aunt    Cancer Paternal Aunt    Cancer Maternal Grandmother    Diabetes Sister    Neuropathy Neg Hx        none pt is aware of    Social History   Socioeconomic History   Marital status: Single    Spouse name: Not on file   Number of children: 3   Years of education: Not on file   Highest education level: Bachelor's degree (e.g., BA, AB, BS)  Occupational History   Not on file  Tobacco Use   Smoking status: Never Smoker   Smokeless tobacco: Never Used  Vaping Use   Vaping Use: Never used  Substance and Sexual Activity   Alcohol use: Not Currently   Drug use: Yes    Types: Marijuana    Comment: 09/23/20-states "about a week ago"   Sexual activity: Yes    Birth control/protection: Implant  Other Topics Concern   Not on file  Social History Narrative   Lives at home with her children   Right  handed   Caffeine:  At least 4 venti size coffees per day, doesn't drink tea much, drinks 48 oz water daily   Social Determinants of Health   Financial Resource Strain:    Difficulty of Paying Living Expenses: Not on file  Food Insecurity:    Worried About Charity fundraiser in the Last Year: Not on file   [employer] of Food in the Last Year: Not on file  Transportation Needs:    Lack of Transportation (Medical): Not on file   Lack of Transportation (Non-Medical): Not on file  Physical Activity:    Days of Exercise per Week: Not on file   Minutes of Exercise per Session: Not on file  Stress:    Feeling of Stress : Not on file  Social Connections:    Frequency of Communication with Friends and Family: Not on file   Frequency of Social Gatherings with Friends and Family: Not on file   Attends Religious Services: Not on file   Active Member of Clubs or Organizations: Not on file   Attends Archivist Meetings: Not on file   Marital Status: Not on file  Intimate Partner Violence:    Fear of Current or Ex-Partner: Not on file   Emotionally Abused: Not on file   Physically Abused: Not on file   Sexually Abused: Not on file    Allergies  Allergen Reactions   Fish Allergy Anaphylaxis   Other Swelling    Mayonnaise causes swelling of the tongue.   Penicillins Hives    Has patient had a PCN reaction causing immediate rash, facial/tongue/throat swelling, SOB or lightheadedness with hypotension: Yes Has patient had a PCN reaction causing severe rash involving mucus membranes or skin necrosis: No Has patient had a PCN reaction that required hospitalization No Has patient had a PCN reaction occurring within the last 10 years: Yes If all of the above answers are "NO", then may proceed with Cephalosporin use.     Shellfish Allergy Anaphylaxis    Patient is allergic to all seafood.  She states she can take the dye that is given in CT scans even though she  has some itching    Peanut Butter Flavor Hives   Histamine Other (See Comments)    Patient  tested positive in allergy testing to Control-histamine 1.   Voltaren [Diclofenac] Rash and Other (  See Comments)    Volatren gel causes blisters.    No current facility-administered medications on file prior to encounter.   Current Outpatient Medications on File Prior to Encounter  Medication Sig Dispense Refill   albuterol (PROAIR HFA) 108 (90 Base) MCG/ACT inhaler Inhale 2 puffs into the lungs every 6 (six) hours as needed for wheezing or shortness of breath.     cetirizine (ZYRTEC) 10 MG tablet Take 20-30 mg by mouth at bedtime.      clonazePAM (KLONOPIN) 0.5 MG tablet Take 0.5 mg by mouth as needed.     dicyclomine (BENTYL) 20 MG tablet Take 1 tablet by mouth as needed.     Fluticasone Furoate 50 MCG/ACT AEPB Inhale 2 sprays into the lungs daily as needed.      Fremanezumab-vfrm (AJOVY) 225 MG/1.5ML SOSY INJECT 225 MG UNDER THE SKIN EVERY 30 DAYS 1.5 mL 11   HYDROcodone-acetaminophen (NORCO) 7.5-325 MG tablet Take 0.5-1 tablets by mouth every 6 (six) hours as needed for moderate pain.     Hyprom-Naphaz-Polysorb-Zn Sulf (CLEAR EYES COMPLETE OP) Place 1 drop into both eyes 2 (two) times daily as needed (dry eyes/itching).      ibuprofen (ADVIL,MOTRIN) 800 MG tablet TK 1 T PO  BID PRN     meloxicam (MOBIC) 7.5 MG tablet Take 7.5 mg by mouth 2 (two) times daily as needed.     metoCLOPramide (REGLAN) 10 MG tablet Take 1 tablet (10 mg total) by mouth every 6 (six) hours as needed for nausea. 30 tablet 6   montelukast (SINGULAIR) 10 MG tablet TAKE 1 TABLET BY MOUTH EVERY DAY 30 tablet 11   Olopatadine HCl (PAZEO) 0.7 % SOLN Apply 1 drop to eye daily.     ORILISSA 150 MG TABS Take 1 tablet by mouth at bedtime.     orphenadrine (NORFLEX) 100 MG tablet Take 100 mg by mouth 2 (two) times daily as needed for muscle spasms.     pregabalin (LYRICA) 150 MG capsule Take 1 capsule (150 mg  total) by mouth at bedtime. 30 capsule 3   traMADol (ULTRAM) 50 MG tablet Take 50-100 mg by mouth every 4 (four) hours as needed.     triamcinolone cream (KENALOG) 0.5 % Apply 1 application topically every 4 (four) hours as needed (rash/allergies).      EPINEPHRINE 0.3 mg/0.3 mL IJ SOAJ injection USE AS DIRECTED FOR LIFE THREATENING ALLERGIC REACTIONS (Patient not taking: Reported on 09/02/2020) 4 each 0   etonogestrel (NEXPLANON) 68 MG IMPL implant 1 each by Subdermal route.     Fremanezumab-vfrm (AJOVY) 225 MG/1.5ML SOSY Inject 225 mg into the skin every 30 (thirty) days. 1 Syringe 0   rizatriptan (MAXALT-MLT) 10 MG disintegrating tablet Take 1 tablet (10 mg total) by mouth as needed for migraine. May repeat in 2 hours if needed 9 tablet 11     ROS Pertinent positives and negative per HPI, all others reviewed and negative  Physical Exam   BP 134/83 (BP Location: Right Arm)    Pulse 77    Temp 98.6 F (37 C) (Oral)    Resp 18    Ht 4' 7.5" (1.41 m)    Wt 85.7 kg    LMP 09/17/2020    SpO2 100%    BMI 43.14 kg/m   Physical Exam Vitals reviewed.  Constitutional:      General: She is not in acute distress.    Appearance: She is well-developed. She is not diaphoretic.  Eyes:  General: No scleral icterus. Pulmonary:     Effort: Pulmonary effort is normal. No respiratory distress.  Abdominal:     General: Bowel sounds are normal. There is no distension.     Palpations: Abdomen is soft. There is no hepatomegaly, splenomegaly or mass.     Tenderness: There is abdominal tenderness in the right lower quadrant. There is no guarding or rebound.     Hernia: There is no hernia in the umbilical area, ventral area or right inguinal area.     Comments: Mild ttp in RLQ, no rebound or guarding  Skin:    General: Skin is warm and dry.  Neurological:     Mental Status: She is alert.     Coordination: Coordination normal.     Cervical Exam    Bedside Ultrasound Not done  My  interpretation: n/a  FHT N/a  Labs Results for orders placed or performed during the hospital encounter of 09/23/20 (from the past 24 hour(s))  Pregnancy, urine POC     Status: None   Collection Time: 09/23/20  4:23 PM  Result Value Ref Range   Preg Test, Ur NEGATIVE NEGATIVE  Urinalysis, Routine w reflex microscopic Urine, Clean Catch     Status: Abnormal   Collection Time: 09/23/20  4:30 PM  Result Value Ref Range   Color, Urine YELLOW YELLOW   APPearance HAZY (A) CLEAR   Specific Gravity, Urine 1.025 1.005 - 1.030   pH 5.0 5.0 - 8.0   Glucose, UA NEGATIVE NEGATIVE mg/dL   Hgb urine dipstick NEGATIVE NEGATIVE   Bilirubin Urine NEGATIVE NEGATIVE   Ketones, ur NEGATIVE NEGATIVE mg/dL   Protein, ur NEGATIVE NEGATIVE mg/dL   Nitrite NEGATIVE NEGATIVE   Leukocytes,Ua NEGATIVE NEGATIVE    Imaging No results found.  MAU Course  Procedures  Lab Orders     Urinalysis, Routine w reflex microscopic Urine, Clean Catch     Pregnancy, urine POC No orders of the defined types were placed in this encounter.  Imaging Orders  No imaging studies ordered today    MDM moderate  Assessment and Plan  #R sided abdominal pain Unclear etiology of pain but discussed with patient at length that there are not any signs of a life threatening cause: - UPT negative, no concern for ectopic - no fever, dysuria, or abnormal stool to suggest infectious etiology such as pyelo/diverticulitis/appendicitis/PID or non-infectious such as nephrolithiasis - unlikely related to brain lesion as that is R sided and would expect L abdominal symptoms if it were involved  Discussed that other possibilities include: - sequelae from her numerous abdominal surgeries of either neuropathic pain (for which she should discuss increasing lyrica w her neurologist) or due to adhesive disease (though no sign of SBO, could f/u w Gen Surg if needed) - ovarian cyst which she has had in the past, though very short duration  and frequent nature not c/w this diagnosis - gas/colic for which she can trial simethicone  All questions answered, patient relieved there are no life threatening causes, will follow up on the above  D/c to home in stable condition   Clarnce Flock

## 2020-09-23 NOTE — MAU Note (Signed)
Presents with c/o abdominal pain, reports had surgery June 23, 2020 in [place], cysts and fibroids removed as well as endometriosis.  States has knot on right side abdomen that's causing pain and pain radiates into back.  Reports had +HPT in September but thinks she may have miscarried d/t VB.  LMP 09/17/2020.

## 2020-09-23 NOTE — Discharge Instructions (Signed)

## 2020-10-09 DIAGNOSIS — M159 Polyosteoarthritis, unspecified: Secondary | ICD-10-CM | POA: Diagnosis not present

## 2020-10-09 DIAGNOSIS — G894 Chronic pain syndrome: Secondary | ICD-10-CM | POA: Diagnosis not present

## 2020-10-09 DIAGNOSIS — G609 Hereditary and idiopathic neuropathy, unspecified: Secondary | ICD-10-CM | POA: Diagnosis not present

## 2020-10-09 DIAGNOSIS — I82409 Acute embolism and thrombosis of unspecified deep veins of unspecified lower extremity: Secondary | ICD-10-CM | POA: Diagnosis not present

## 2020-11-04 ENCOUNTER — Telehealth: Payer: Self-pay | Admitting: *Deleted

## 2020-11-04 DIAGNOSIS — M159 Polyosteoarthritis, unspecified: Secondary | ICD-10-CM | POA: Diagnosis not present

## 2020-11-04 DIAGNOSIS — N809 Endometriosis, unspecified: Secondary | ICD-10-CM | POA: Diagnosis not present

## 2020-11-04 DIAGNOSIS — D649 Anemia, unspecified: Secondary | ICD-10-CM | POA: Diagnosis not present

## 2020-11-04 DIAGNOSIS — K580 Irritable bowel syndrome with diarrhea: Secondary | ICD-10-CM | POA: Diagnosis not present

## 2020-11-04 NOTE — Telephone Encounter (Signed)
Prior authorization request approval received from [facility] for Ajovy 225 mg/1.5 ml for 11/03/2020 - 11/03/2021.Marland Kitchen  Request to Provider folder for review.

## 2020-11-09 ENCOUNTER — Encounter: Payer: Self-pay | Admitting: Internal Medicine

## 2020-11-10 ENCOUNTER — Other Ambulatory Visit: Payer: Self-pay | Admitting: Internal Medicine

## 2020-11-10 DIAGNOSIS — R9089 Other abnormal findings on diagnostic imaging of central nervous system: Secondary | ICD-10-CM

## 2020-12-28 ENCOUNTER — Telehealth: Payer: Self-pay | Admitting: *Deleted

## 2020-12-28 NOTE — Telephone Encounter (Signed)
2022 Formulary change requires Ajovy prior authorization.  Submitted PA to [facility] via CoverMyMeds today.

## 2020-12-29 NOTE — Telephone Encounter (Signed)
PA case ID: [id number] response provided by [facility] for Ajovy 225 mg/1.5 ml is "N/A outcome and request has been closed; [id number]" per CoverMyMeds prior authorization web portal. Portal information faxed to [facility] along with Ajovy $5.00 co-pay assistance information.  [phone]).  Currently no further activity.     Ajovy previously authorized 11/02/2020 through 11/03/2021.

## 2021-01-23 ENCOUNTER — Encounter: Payer: Self-pay | Admitting: Internal Medicine

## 2021-01-25 ENCOUNTER — Telehealth: Payer: Self-pay | Admitting: Internal Medicine

## 2021-01-25 NOTE — Telephone Encounter (Signed)
Scheduled per 03/14 scheduled message for 03/18 phone visit, patient has been called and notified.

## 2021-01-27 ENCOUNTER — Telehealth: Payer: Self-pay

## 2021-01-27 NOTE — Telephone Encounter (Signed)
The following message was received:  Hello! Just received a call from this patient stating that she needs an order for a brain MRI sent to [facility]. She also states that the PA has been obtained but the order has not been sent, Please contact the patient @ [phone] to confirm that this has been sent. Thanks    I have left the pt a message advising the order was faxed 01/21/21 and confirmation was received but it will be faxed again. Also, a PA required an order, so I ask her to confirm the PA was obtained or is there something else she is needing.

## 2021-01-28 ENCOUNTER — Other Ambulatory Visit: Payer: Self-pay

## 2021-01-28 ENCOUNTER — Inpatient Hospital Stay: Payer: BC Managed Care – PPO | Attending: Internal Medicine | Admitting: Internal Medicine

## 2021-01-28 DIAGNOSIS — G43709 Chronic migraine without aura, not intractable, without status migrainosus: Secondary | ICD-10-CM | POA: Diagnosis not present

## 2021-01-28 DIAGNOSIS — G5792 Unspecified mononeuropathy of left lower limb: Secondary | ICD-10-CM

## 2021-01-28 DIAGNOSIS — G5790 Unspecified mononeuropathy of unspecified lower limb: Secondary | ICD-10-CM | POA: Insufficient documentation
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
## 2021-02-03 ENCOUNTER — Encounter: Payer: Self-pay | Admitting: Internal Medicine

## 2021-02-18 ENCOUNTER — Encounter: Payer: Self-pay | Admitting: Internal Medicine

## 2021-03-03 ENCOUNTER — Inpatient Hospital Stay: Payer: BC Managed Care – PPO | Attending: Internal Medicine | Admitting: Internal Medicine

## 2021-03-03 ENCOUNTER — Other Ambulatory Visit: Payer: Self-pay

## 2021-03-03 VITALS — BP 127/75 | HR 78 | Temp 96.9°F | Resp 18 | Wt 188.5 lb

## 2021-03-03 DIAGNOSIS — Z79899 Other long term (current) drug therapy: Secondary | ICD-10-CM | POA: Diagnosis not present

## 2021-03-03 DIAGNOSIS — Z791 Long term (current) use of non-steroidal anti-inflammatories (NSAID): Secondary | ICD-10-CM | POA: Insufficient documentation

## 2021-03-03 DIAGNOSIS — Z803 Family history of malignant neoplasm of breast: Secondary | ICD-10-CM | POA: Diagnosis not present

## 2021-03-03 DIAGNOSIS — R9089 Other abnormal findings on diagnostic imaging of central nervous system: Secondary | ICD-10-CM | POA: Diagnosis not present

## 2021-03-03 DIAGNOSIS — G43709 Chronic migraine without aura, not intractable, without status migrainosus: Secondary | ICD-10-CM | POA: Insufficient documentation

## 2021-03-03 DIAGNOSIS — G5792 Unspecified mononeuropathy of left lower limb: Secondary | ICD-10-CM

## 2021-03-03 DIAGNOSIS — R200 Anesthesia of skin: Secondary | ICD-10-CM | POA: Diagnosis not present

## 2021-03-03 DIAGNOSIS — G9389 Other specified disorders of brain: Secondary | ICD-10-CM | POA: Insufficient documentation
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
## 2021-03-05 ENCOUNTER — Other Ambulatory Visit: Payer: Self-pay | Admitting: Internal Medicine

## 2021-06-16 ENCOUNTER — Encounter: Payer: Self-pay | Admitting: Internal Medicine

## 2021-06-17 ENCOUNTER — Telehealth: Payer: Self-pay | Admitting: Internal Medicine

## 2021-06-17 NOTE — Telephone Encounter (Signed)
Scheduled appt per 8/5 sch msg. Pt aware.

## 2021-06-23 ENCOUNTER — Inpatient Hospital Stay: Payer: BC Managed Care – PPO | Attending: Internal Medicine | Admitting: Internal Medicine

## 2021-06-23 DIAGNOSIS — G43709 Chronic migraine without aura, not intractable, without status migrainosus: Secondary | ICD-10-CM

## 2021-06-23 MED ORDER — PREDNISONE 50 MG PO TABS: 50.0000 mg | ORAL_TABLET | Freq: Every day | ORAL | 0 refills | Status: DC

## 2021-06-23 NOTE — Progress Notes (Signed)
I connected with Nina Wood on 06/23/21 at 12:00 PM EDT by telephone visit and verified that I am speaking with the correct person using two identifiers.  I discussed the limitations, risks, security and privacy concerns of performing an evaluation and management service by telemedicine and the availability of in-person appointments. I also discussed with the patient that there may be a patient responsible charge related to this service. The patient expressed understanding and agreed to proceed.  Other persons participating in the visit and their role in the encounter:  n/a  Patient's location:  Home  Provider's location:  Office  Chief Complaint:  Chronic migraine w/o aura w/o status migrainosus, not intractable  History of Present Ilness: Nina Wood describes worsening of headache syndrome for past two weeks.  She cannot identify provoking factor aside from ongoing stress/trauma previously documented.  She has been dosing Ibuprofen twice daily over the past week, which has helped some.  Headache syndrome begins as migraine but evolves into less severe, tension type headache.  No other new symptoms. Observations: Language and cognition at baseline Assessment and Plan: Chronic migraine w/o aura w/o status migrainosus, not intractable  Breakthrough migraine cluster refractory to analgesia and prophylaxis.    We again counseled her regarding role of stress in headache; she is actively in therapy and working on this.  For headache, we recommended short course of corticosteroids; prednisone '50mg'$  daily x7 days.  No other changes for now pending response to prednisone.  Follow Up Instructions: We will touch base with her again via phone in 1 week.  If not improved, will consider addition of second headache prevention agent.  I discussed the assessment and treatment plan with the patient.  The patient was provided an opportunity to ask questions and all were answered.  The patient agreed with the  plan and demonstrated understanding of the instructions.    The patient was advised to call back or seek an in-person evaluation if the symptoms worsen or if the condition fails to improve as anticipated.  I provided 5-10 minutes of non-face-to-face time during this enocunter.  Ventura Sellers, MD   I provided 15 minutes of non face-to-face telephone visit time during this encounter, and > 50% was spent counseling as documented under my assessment & plan.

## 2021-06-24 ENCOUNTER — Telehealth: Payer: Self-pay | Admitting: Internal Medicine

## 2021-06-24 NOTE — Telephone Encounter (Signed)
Scheduled appt per 8/11 los. Pt aware.  

## 2021-07-01 ENCOUNTER — Encounter: Payer: Self-pay | Admitting: Internal Medicine

## 2021-07-02 ENCOUNTER — Other Ambulatory Visit: Payer: Self-pay | Admitting: Internal Medicine

## 2021-07-02 DIAGNOSIS — G43709 Chronic migraine without aura, not intractable, without status migrainosus: Secondary | ICD-10-CM

## 2021-07-04 ENCOUNTER — Telehealth: Payer: Self-pay | Admitting: Internal Medicine

## 2021-07-04 ENCOUNTER — Ambulatory Visit: Payer: BC Managed Care – PPO | Admitting: Internal Medicine

## 2021-07-04 NOTE — Telephone Encounter (Signed)
R/s appt per 8/22 sch msg. Pt aware.

## 2021-07-07 ENCOUNTER — Inpatient Hospital Stay (HOSPITAL_BASED_OUTPATIENT_CLINIC_OR_DEPARTMENT_OTHER): Payer: BC Managed Care – PPO | Admitting: Internal Medicine

## 2021-07-07 DIAGNOSIS — G43709 Chronic migraine without aura, not intractable, without status migrainosus: Secondary | ICD-10-CM

## 2021-07-07 MED ORDER — AMITRIPTYLINE HCL 50 MG PO TABS: 50.0000 mg | ORAL_TABLET | Freq: Every day | ORAL | 3 refills | Status: DC

## 2021-07-07 MED ORDER — AJOVY 225 MG/1.5ML ~~LOC~~ SOSY: PREFILLED_SYRINGE | SUBCUTANEOUS | 11 refills | Status: DC

## 2021-07-07 NOTE — Progress Notes (Signed)
I connected with [name] on 07/07/21 at  9:30 AM EDT by telephone visit and verified that I am speaking with the correct person using two identifiers.  I discussed the limitations, risks, security and privacy concerns of performing an evaluation and management service by telemedicine and the availability of in-person appointments. I also discussed with the patient that there may be a patient responsible charge related to this service. The patient expressed understanding and agreed to proceed.  Other persons participating in the visit and their role in the encounter:  n/a  Patient's location:  Home  Provider's location:  Office  Chief Complaint:  Chronic migraine w/o aura w/o status migrainosus, not intractable  History of Present Ilness: Nina Wood describes improvement in migrainous type headaches, these are now occurring rarely.  However, she continues to experience pain/discomfort "behind my eyes" and also sharp pains along side of head which last for several minutes.  These headaches repeat throughout the day, and do interfere with her quality of life.  Not needing to dose the maxalt, continues on ajovy as prior. Observations: Language and cognition at baseline Assessment and Plan: Chronic migraine w/o aura w/o status migrainosus, not intractable  Recommending adding additional headache prophylaxis.  Will start with trial of Elavil '50mg'$  HS.  This may also help with sleep and anxiety.  Follow Up Instructions: Will call her in 1 month to assess response and continue titration if warranted  I discussed the assessment and treatment plan with the patient.  The patient was provided an opportunity to ask questions and all were answered.  The patient agreed with the plan and demonstrated understanding of the instructions.    The patient was advised to call back or seek an in-person evaluation if the symptoms worsen or if the condition fails to improve as anticipated.  I provided 5-10 minutes of  non-face-to-face time during this enocunter.  Ventura Sellers, MD   I provided 15 minutes of non face-to-face telephone visit time during this encounter, and > 50% was spent counseling as documented under my assessment & plan.

## 2021-08-04 ENCOUNTER — Encounter: Payer: Self-pay | Admitting: *Deleted

## 2021-08-04 ENCOUNTER — Inpatient Hospital Stay: Payer: BC Managed Care – PPO | Attending: Internal Medicine | Admitting: Internal Medicine

## 2021-08-04 DIAGNOSIS — G43709 Chronic migraine without aura, not intractable, without status migrainosus: Secondary | ICD-10-CM | POA: Diagnosis not present

## 2021-08-04 MED ORDER — AMITRIPTYLINE HCL 10 MG PO TABS: 10.0000 mg | ORAL_TABLET | Freq: Every day | ORAL | 1 refills | Status: DC

## 2021-08-04 NOTE — Progress Notes (Signed)
I connected with Nina Wood on 08/04/21 at 11:00 AM EDT by telephone visit and verified that I am speaking with the correct person using two identifiers.  I discussed the limitations, risks, security and privacy concerns of performing an evaluation and management service by telemedicine and the availability of in-person appointments. I also discussed with the patient that there may be a patient responsible charge related to this service. The patient expressed understanding and agreed to proceed.  Other persons participating in the visit and their role in the encounter:  n/a  Patient's location:  Home  Provider's location:  Office  Chief Complaint:  Chronic migraine w/o aura w/o status migrainosus, not intractable - Plan: MR Brain W Wo Contrast  History of Present Ilness: ARIEN Wood was unable to tolerated the prescribed 50mg  dose of Elavil due to excessive sleepiness which spilled over into daytime.  Headaches have been persistent, although in past week she quartered the tablet, and that has been somewhat helpful without the harmful side effects.  She still describes almost constant "pressure behind my eyes" despite improvement in shorter/intense headaches.    Observations: Language and cognition at baseline  Assessment and Plan: Chronic migraine w/o aura w/o status migrainosus, not intractable - Plan: MR Brain W Wo Contrast  Recommended decreasing Elavil to 10mg  HS tablets due to better tolerance.  Given refractory nature of headaches in setting of possible low grade glioma, we will move up MRI brain, previously scheduled for January 2023, to prior to next visit in 1 month.  Follow Up Instructions: RTC in 1 month after MRI brain.  I discussed the assessment and treatment plan with the patient.  The patient was provided an opportunity to ask questions and all were answered.  The patient agreed with the plan and demonstrated understanding of the instructions.    The patient was advised to  call back or seek an in-person evaluation if the symptoms worsen or if the condition fails to improve as anticipated.  I provided 5-10 minutes of non-face-to-face time during this enocunter.  Ventura Sellers, MD   I provided 15 minutes of non face-to-face telephone visit time during this encounter, and > 50% was spent counseling as documented under my assessment & plan.

## 2021-08-12 ENCOUNTER — Encounter: Payer: Self-pay | Admitting: Internal Medicine

## 2021-08-18 ENCOUNTER — Other Ambulatory Visit: Payer: Self-pay | Admitting: Radiation Therapy

## 2021-08-25 ENCOUNTER — Ambulatory Visit (HOSPITAL_COMMUNITY)
Admission: RE | Admit: 2021-08-25 | Discharge: 2021-08-25 | Disposition: A | Discharge status: Home / Self Care | Payer: BC Managed Care – PPO | Source: Ambulatory Visit | Attending: Internal Medicine | Admitting: Internal Medicine

## 2021-08-25 ENCOUNTER — Other Ambulatory Visit: Payer: Self-pay

## 2021-08-25 DIAGNOSIS — G43709 Chronic migraine without aura, not intractable, without status migrainosus: Secondary | ICD-10-CM | POA: Diagnosis present

## 2021-08-25 MED ORDER — GADOBUTROL 1 MMOL/ML IV SOLN
8.0000 mL | Freq: Once | INTRAVENOUS | Status: AC | PRN
  Administered 2021-08-25: 8 mL via INTRAVENOUS

## 2021-09-01 ENCOUNTER — Inpatient Hospital Stay: Payer: BC Managed Care – PPO | Attending: Internal Medicine | Admitting: Internal Medicine

## 2021-09-01 DIAGNOSIS — G43709 Chronic migraine without aura, not intractable, without status migrainosus: Secondary | ICD-10-CM

## 2021-09-01 DIAGNOSIS — R9089 Other abnormal findings on diagnostic imaging of central nervous system: Secondary | ICD-10-CM

## 2021-09-01 NOTE — Progress Notes (Signed)
I connected with Nina Wood on 09/01/21 at 11:00 AM EDT by telephone visit and verified that I am speaking with the correct person using two identifiers.  I discussed the limitations, risks, security and privacy concerns of performing an evaluation and management service by telemedicine and the availability of in-person appointments. I also discussed with the patient that there may be a patient responsible charge related to this service. The patient expressed understanding and agreed to proceed.  Other persons participating in the visit and their role in the encounter:  n/a   Patient's location:  Home  Provider's location:  Office  Chief Complaint:  Chronic migraine w/o aura w/o status migrainosus, not intractable - Plan: Ambulatory referral to Neurology  History of Present Ilness: Nina Wood has no new complaints today, but chronic daily headache is still unimproved despite three headache drugs and lifestyle adjustments.    Observations: Language and cognition at baseline  Assessment and Plan: Chronic migraine w/o aura w/o status migrainosus, not intractable - Plan: Ambulatory referral to Neurology  Imaging:  [facility] Clinician Interpretation: I have personally reviewed the CNS images as listed.  My interpretation, in the context of the patient's clinical presentation, is stable disease  MR Brain W Wo Contrast  Result Date: 08/27/2021 CLINICAL DATA:  Headaches, prior brain imaging with possible lesion. The do EXAM: MRI HEAD WITHOUT AND WITH CONTRAST TECHNIQUE: Multiplanar, multiecho pulse sequences of the brain and surrounding structures were obtained without and with intravenous contrast. CONTRAST:  11mL GADAVIST GADOBUTROL 1 MMOL/ML IV SOLN COMPARISON:  02/08/2021. FINDINGS: Brain: Redemonstrated area of increased T2 signal in the right frontoparietal subcortical white matter (series 11, image 36), which 11 x 5 mm, previously 11 x 5 mm when remeasured similarly. On the present study,  previously mentioned intrinsic T1 signal and/or contrast enhancement are not seen. No restricted diffusion is associated with this lesion. No new areas of T2 hyperintensity. No acute hemorrhage, infarct, mass, mass effect, or midline shift. No abnormal enhancement. Vascular: Normal flow voids. Skull and upper cervical spine: Normal marrow signal. Sinuses/Orbits: Negative. Other: None. IMPRESSION: 1. Redemonstrated small nonenhancing area of increased T2 signal in the right frontoparietal will white matter, unchanged in size since 2019. This may be represent low-grade gliosis from a remote insult or a low-grade glioma. 2. No acute intracranial process. Electronically Signed   By: Merilyn Baba M.D.   On: 08/27/2021 16:18    She is clinically stable from neuro-oncologic standpoint.  She continues to have daily headaches as per our prior discussion.    Follow Up Instructions: We recommended she seek out a second opinion with a dedicated headache specialist for refractory symptoms.  Next MRI brain and RTC in 1 year.    I discussed the assessment and treatment plan with the patient.  The patient was provided an opportunity to ask questions and all were answered.  The patient agreed with the plan and demonstrated understanding of the instructions.    The patient was advised to call back or seek an in-person evaluation if the symptoms worsen or if the condition fails to improve as anticipated.  I provided 5-10 minutes of non-face-to-face time during this enocunter.  Ventura Sellers, MD   I provided 15 minutes of non face-to-face telephone visit time during this encounter, and > 50% was spent counseling as documented under my assessment & plan.

## 2021-12-01 ENCOUNTER — Encounter: Payer: Self-pay | Admitting: Internal Medicine

## 2021-12-02 ENCOUNTER — Telehealth: Payer: Self-pay | Admitting: Internal Medicine

## 2021-12-02 NOTE — Telephone Encounter (Signed)
Scheduled appointment per 1/19 scheduling message. Patient is aware of upcoming appointment.

## 2021-12-04 ENCOUNTER — Other Ambulatory Visit: Payer: Self-pay | Admitting: Internal Medicine

## 2021-12-05 ENCOUNTER — Inpatient Hospital Stay: Payer: BC Managed Care – PPO | Attending: Internal Medicine | Admitting: Internal Medicine

## 2021-12-05 ENCOUNTER — Other Ambulatory Visit: Payer: Self-pay

## 2021-12-05 VITALS — BP 130/89 | HR 88 | Temp 97.9°F | Resp 20 | Wt 192.3 lb

## 2021-12-05 DIAGNOSIS — Z8042 Family history of malignant neoplasm of prostate: Secondary | ICD-10-CM | POA: Insufficient documentation

## 2021-12-05 DIAGNOSIS — G540 Brachial plexus disorders: Secondary | ICD-10-CM | POA: Diagnosis not present

## 2021-12-05 DIAGNOSIS — G43709 Chronic migraine without aura, not intractable, without status migrainosus: Secondary | ICD-10-CM | POA: Diagnosis not present

## 2021-12-05 DIAGNOSIS — Z803 Family history of malignant neoplasm of breast: Secondary | ICD-10-CM | POA: Diagnosis not present
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
## 2021-12-06 ENCOUNTER — Encounter: Payer: Self-pay | Admitting: Internal Medicine

## 2021-12-07 ENCOUNTER — Telehealth: Payer: Self-pay

## 2021-12-07 NOTE — Telephone Encounter (Signed)
Patient left message late evening on 12/06/21 reporting that her neuropathic pain was "worsening" on her left hand. She describes it as "tingling moving towards numbness." Also reports "pain" when anything touches it.  Does not note any visible differences to skin color and no edema noted. Reviewed notes from last office visit. Patient reports taking Lyrica and Elavil but reports she feels "hung over" the next day.  Patient also left additional message on 12/06/21 asking about status of referral for additional nerve testing.

## 2021-12-08 ENCOUNTER — Telehealth: Payer: Self-pay | Admitting: *Deleted

## 2021-12-08 NOTE — Telephone Encounter (Signed)
Routed Referral and office notes to [facility] via [facility] Fax [phone].  Made patient aware.

## 2021-12-12 ENCOUNTER — Encounter: Payer: Self-pay | Admitting: Neurology

## 2021-12-12 ENCOUNTER — Other Ambulatory Visit: Payer: Self-pay

## 2021-12-12 ENCOUNTER — Telehealth: Payer: Self-pay | Admitting: *Deleted

## 2021-12-12 DIAGNOSIS — R202 Paresthesia of skin: Secondary | ICD-10-CM

## 2021-12-12 NOTE — Telephone Encounter (Signed)
[facility] called to advise that they would not be able to accept the referral because the patient had been dismissed from their practice.    Routing referral to [facility] via [employer]  [phone]

## 2022-01-12 ENCOUNTER — Ambulatory Visit (INDEPENDENT_AMBULATORY_CARE_PROVIDER_SITE_OTHER): Payer: BC Managed Care – PPO | Admitting: Neurology

## 2022-01-12 ENCOUNTER — Other Ambulatory Visit: Payer: Self-pay

## 2022-01-12 DIAGNOSIS — R202 Paresthesia of skin: Secondary | ICD-10-CM | POA: Diagnosis not present

## 2022-01-12 NOTE — Procedures (Signed)
[facility]  ?[address], [address] ? [place], [place] [address] ?Tel: [phone] ?Fax:  [phone] ?Test Date:  01/12/2022 ? ?Patient: Nina Wood DOB: Apr 04, 1986 Physician: Narda Amber, DO  ?Sex: Female Height: 4\' 7"  Ref Phys: Cecil Cobbs, MD  ?ID#: [id number]   Technician:   ? ?Patient Complaints: ?This is a 36 year old female referred for evaluation of left arm pain and paresthesia following Nexplanon removal.  ? ?NCV & EMG Findings: ?Extensive electrodiagnostic testing of the left upper extremity and additional studies of the right shows:  ?Bilateral median, ulnar, radial, medial antebrachial cutaneous, and lateral antebrachial cutaneous sensory responses are symmetric and within normal limits. ?Bilateral median, bilateral ulnar, and left radial motor responses are within normal limits. ?There is no evidence of active or chronic motor axonal loss changes affecting any of the tested muscles.  Motor unit configuration and recruitment pattern is within normal limits. ? ?Impression: ?This is a normal study of the left upper extremity.  In particular, there is no evidence of a brachial plexopathy, cervical radiculopathy, or sensorimotor polyneuropathy. ? ? ?___________________________ ?Narda Amber, DO ? ? ? ?Nerve Conduction Studies ?Anti Sensory Summary Table ? ? Stim Site NR Peak (ms) Norm Peak (ms) P-T Amp (?V) Norm P-T Amp  ?Left Lat Ante Brach Cutan Anti Sensory (Lat Forearm)  34?C  ?Lat Biceps    1.7 <2.9 16.8 >14  ?Right Lat Ante Brach Cutan Anti Sensory (Lat Forearm)  34?C  ?Lat Biceps    1.8 <2.9 14.0 >14  ?Left Med Ante Brach Cutan Anti Sensory (Med Forearm)  34?C  ?Elbow    3.1  11.8   ?Right Med Ante Brach Cutan Anti Sensory (Med Forearm)  34?C  ?Elbow    3.2  13.1   ?Left Median Anti Sensory (2nd Digit)  34?C  ?Wrist    2.6 <3.4 48.8 >20  ?Right Median Anti Sensory (2nd Digit)  34?C  ?Wrist    2.9 <3.4 50.3 >20  ?Left Radial Anti Sensory (Base 1st Digit)  34?C  ?Wrist    2.2 <2.7  44.4 >18  ?Right Radial Anti Sensory (Base 1st Digit)  34?C  ?Wrist    2.0 <2.7 42.7 >18  ?Left Ulnar Anti Sensory (5th Digit)  34?C  ?Wrist    2.4 <3.1 59.0 >12  ?Right Ulnar Anti Sensory (5th Digit)  34?C  ?Wrist    2.4 <3.1 50.7 >12  ? ?Motor Summary Table ? ? Stim Site NR Onset (ms) Norm Onset (ms) O-P Amp (mV) Norm O-P Amp Site1 Site2 Delta-0 (ms) Dist (cm) Vel (m/s) Norm Vel (m/s)  ?Left Median Motor (Abd Poll Brev)  34?C  ?Wrist    2.9 <3.9 11.1 >6 Elbow Wrist 4.1 27.0 66 >50  ?Elbow    7.0  10.0         ?Right Median Motor (Abd Poll Brev)  34?C  ?Wrist    3.3 <3.9 10.4 >6 Elbow Wrist 4.0 26.0 65 >50  ?Elbow    7.3  10.1         ?Left Radial Motor (Ext Ind Prop)  34?C  ?7cm    1.6 <3.1 7.3 >6 ACF 7cm 1.4 9.0 64 >50  ?ACF    3.0  6.6  B-Spiral Groove ACF 1.1 8.0 73   ?B-Spiral Groove    4.1  6.2  A-Spiral Groove B-Spiral Groove 0.4 3.0 75   ?A-Spiral Groove    4.5  6.1         ?Left Ulnar  Motor (Abd Dig Minimi)  34?C  ?Wrist    2.1 <3.1 11.4 >7 B Elbow Wrist 3.1 19.0 61 >50  ?B Elbow    5.2  10.8  A Elbow B Elbow 1.6 10.0 63 >50  ?A Elbow    6.8  10.6         ?Right Ulnar Motor (Abd Dig Minimi)  34?C  ?Wrist    2.3 <3.1 11.0 >7 B Elbow Wrist 2.9 19.0 66 >50  ?B Elbow    5.2  10.3  A Elbow B Elbow 1.6 10.0 63 >50  ?A Elbow    6.8  9.3         ? ?EMG ? ? Side Muscle Ins Act Fibs Psw Fasc Number Recrt Dur Dur. Amp Amp. Poly Poly. Comment  ?Left 1stDorInt Nml Nml Nml Nml Nml Nml Nml Nml Nml Nml Nml Nml N/A  ?Left ABD Dig Min Nml Nml Nml Nml Nml Nml Nml Nml Nml Nml Nml Nml N/A  ?Left PronatorTeres Nml Nml Nml Nml Nml Nml Nml Nml Nml Nml Nml Nml N/A  ?Left Biceps Nml Nml Nml Nml Nml Nml Nml Nml Nml Nml Nml Nml N/A  ?Left Triceps Nml Nml Nml Nml Nml Nml Nml Nml Nml Nml Nml Nml N/A  ?Left Deltoid Nml Nml Nml Nml Nml Nml Nml Nml Nml Nml Nml Nml N/A  ? ? ? ? ?Waveforms: ?    ? ?    ? ?    ? ?    ? ?    ? ? ?

## 2022-02-10 ENCOUNTER — Encounter: Payer: Self-pay | Admitting: *Deleted

## 2022-02-14 ENCOUNTER — Encounter: Payer: Self-pay | Admitting: *Deleted

## 2022-02-14 ENCOUNTER — Ambulatory Visit: Payer: BC Managed Care – PPO | Admitting: *Deleted

## 2022-02-14 ENCOUNTER — Ambulatory Visit: Payer: BC Managed Care – PPO | Attending: Obstetrics and Gynecology | Admitting: Maternal & Fetal Medicine

## 2022-02-14 VITALS — BP 114/72 | HR 71

## 2022-02-14 DIAGNOSIS — O99351 Diseases of the nervous system complicating pregnancy, first trimester: Secondary | ICD-10-CM

## 2022-02-14 DIAGNOSIS — Z98891 History of uterine scar from previous surgery: Secondary | ICD-10-CM | POA: Diagnosis not present

## 2022-02-14 DIAGNOSIS — E669 Obesity, unspecified: Secondary | ICD-10-CM

## 2022-02-14 DIAGNOSIS — Z86718 Personal history of other venous thrombosis and embolism: Secondary | ICD-10-CM | POA: Diagnosis not present

## 2022-02-14 DIAGNOSIS — Z3A09 9 weeks gestation of pregnancy: Secondary | ICD-10-CM | POA: Diagnosis not present

## 2022-02-14 DIAGNOSIS — O09291 Supervision of pregnancy with other poor reproductive or obstetric history, first trimester: Secondary | ICD-10-CM | POA: Diagnosis not present

## 2022-02-14 DIAGNOSIS — G43909 Migraine, unspecified, not intractable, without status migrainosus: Secondary | ICD-10-CM | POA: Diagnosis not present

## 2022-02-14 DIAGNOSIS — Z6841 Body Mass Index (BMI) 40.0 and over, adult: Secondary | ICD-10-CM

## 2022-02-14 DIAGNOSIS — G43709 Chronic migraine without aura, not intractable, without status migrainosus: Secondary | ICD-10-CM

## 2022-02-14 DIAGNOSIS — O09521 Supervision of elderly multigravida, first trimester: Secondary | ICD-10-CM | POA: Insufficient documentation

## 2022-02-14 DIAGNOSIS — O09891 Supervision of other high risk pregnancies, first trimester: Secondary | ICD-10-CM | POA: Diagnosis present

## 2022-02-14 DIAGNOSIS — O99211 Obesity complicating pregnancy, first trimester: Secondary | ICD-10-CM | POA: Insufficient documentation

## 2022-02-14 DIAGNOSIS — O99841 Bariatric surgery status complicating pregnancy, first trimester: Secondary | ICD-10-CM | POA: Insufficient documentation

## 2022-02-14 DIAGNOSIS — G5790 Unspecified mononeuropathy of unspecified lower limb: Secondary | ICD-10-CM

## 2022-02-14 NOTE — Progress Notes (Signed)
Stopped all meds 02/02/22. Needs to know what is safe to take. ?

## 2022-02-15 NOTE — Progress Notes (Signed)
MFM Consultation ? ?Nina Wood is a 36 yo G11P2 who is here at 9w 5d at the request of Nina Wood regarding history of DVT management in pregnancy. ? ?Nina Wood is doing well today without complaints. She denies vaginal bleeding or uterine contractions. ? ?Her pregnancy issues include: ? ?1) History of DVT- She had two DVT events both occurred within pregnancy per Epic notes however, my interview with Nina Wood I inferred that the DVT did not occur in pregnancy.  ? ?The first occurring in 2015 that was not associated with any event. She reports that she has a negative thrombophilia evaluation at that time. There were no notes from a hematologist available for review. She was treated with Lovenox and then warfarin for 6 months. She had a recurrent DVT in 2018 and was treated with Lovenox during the pregnancy. Ms. Stennett was also treated with Xarelto in the past which caused her nose and gums to bleed.  ? ?She had a negative SLE and APLAS evaluation ? ?2) Advanced maternal age ?She is 36 yo and plans to have genetic screening. ? ?3) BMI 40+ ?She plans to take low dose ASA and check her blood sugars but not have the early Glucola given her prior gastric bypass. ? ?4) History of LPSC myomectomy. ?It is uncertain if this procedure penetrated the endometrial cavity. She had two prior cesarean deliveries and plans to have a repeat. ? ?5) History of gastric bypass: Gastric bypass performed assuming Roux-en-y procedure. She is doing well but has not have her vitamins examined in some time. She is being treated for anemia. ? ?6) Chronic Migraine- She has had chronic migraines and has been seen by Neurology in the past most recent visit 12/05/21 with a neuropathic syndrome of uncertain etiology. She takes Ajovy for migraine relief which she reports is a quality of life medication for her.  ? ?  02/14/2022  ? 11:18 AM 12/05/2021  ?  1:00 PM 03/03/2021  ?  9:17 AM  ?Vitals with BMI  ?Weight  192 lbs 5 oz 188 lbs 8 oz  ?Systolic 250 539 767   ?Diastolic 72 89 75  ?Pulse 71 88 78  ? ? ?  Latest Ref Rng & Units 04/07/2017  ? 12:32 AM 02/24/2017  ?  2:24 PM 07/25/2016  ?  7:30 PM  ?CBC  ?WBC 4.0 - 10.5 K/uL 6.1   6.3   8.6    ?Hemoglobin 12.0 - 15.0 g/dL 8.7   9.2   10.0    ?Hematocrit 36.0 - 46.0 % 28.3   30.2   31.5    ?Platelets 150 - 400 K/uL 334   353   366    ? ? ?  Latest Ref Rng & Units 08/19/2018  ?  1:47 PM 04/07/2017  ? 12:32 AM 02/24/2017  ?  2:24 PM  ?CMP  ?Glucose 65 - 99 mg/dL 88   99   103    ?BUN 6 - 20 mg/dL '14   13   9    '$ ?Creatinine 0.57 - 1.00 mg/dL 0.60   0.59   0.78    ?Sodium 134 - 144 mmol/L 140   138   136    ?Potassium 3.5 - 5.2 mmol/L 3.7   3.9   3.9    ?Chloride 96 - 106 mmol/L 105   108   107    ?CO2 20 - 29 mmol/L '22   23   25    '$ ?Calcium 8.7 -  10.2 mg/dL 9.0   8.3   8.7    ?Total Protein 6.5 - 8.1 g/dL  5.9     ?Total Bilirubin 0.3 - 1.2 mg/dL  0.5     ?Alkaline Phos 38 - 126 U/L  53     ?AST 15 - 41 U/L  25     ?ALT 14 - 54 U/L  11     ? ?OB History  ?Gravida Para Term Preterm AB Living  ?'11 2 2 '$ 0 7 2  ?SAB IAB Ectopic Multiple Live Births  ?'5 1 1 '$ 0 0  ?  ?# Outcome Date GA Lbr Len/2nd Weight Sex Delivery Anes PTL Lv  ?11 Current           ?10 Gravida           ?9 SAB           ?8 SAB           ?7 SAB           ?6 SAB           ?5 Term      CS-LTranv     ?4 IAB           ?3 SAB           ?2 Ectopic           ?1 Term      CS-LTranv     ? ?Past Medical History:  ?Diagnosis Date  ? Allergic rhinitis   ? Allergy to cats   ? Allergy to dogs   ? Anemia   ? Anxiety   ? Asthma   ? BMI 37.0-37.9, adult   ? Breast cyst   ? Chronic pain syndrome   ? Deep vein thrombosis (DVT) (HCC)   ? Endometriosis, uterus   ? Environmental allergies   ? Fibromyalgia syndrome   ? Generalized osteoarthrosis   ? GERD (gastroesophageal reflux disease)   ? Headache   ? Hip discomfort   ? IBS (irritable bowel syndrome)   ? with diarrhea  ? Obesity   ? Ovarian cyst   ? Paroxysmal dyspnea   ? PTSD (post-traumatic stress disorder)   ? Patient reported  ? Uterine  fibroid   ? ?Past Surgical History:  ?Procedure Laterality Date  ? APPENDECTOMY  2003  ? CESAREAN SECTION    ? DILATION AND CURETTAGE OF UTERUS    ? 1 WEEK AGO  ? DILATION AND EVACUATION Bilateral 05/18/2016  ? Procedure: DILATATION AND EVACUATION;  Surgeon: Bobbye Charleston, MD;  Location: [facility];  Service: Gynecology;  Laterality: Bilateral;  ? ECTOPIC PREGNANCY SURGERY    ? etopi    ? GASTRIC BYPASS  03/14/2012  ? INDUCED ABORTION    ? MYOMECTOMY  06/23/2020  ? OVARIAN CYST SURGERY    ? ?Family History  ?Problem Relation Age of Onset  ? Hypertension Mother   ? Breast cancer Mother   ? Prostate cancer Father   ? Hypertension Maternal Uncle   ? Diabetes Paternal Aunt   ? Cancer Paternal Aunt   ? Cancer Maternal Grandmother   ? Diabetes Sister   ? Neuropathy Neg Hx   ?     none pt is aware of  ? ? ?Current Outpatient Medications (Endocrine & Metabolic):  ?  ORILISSA 150 MG TABS, Take 1 tablet by mouth at bedtime. ?  predniSONE (DELTASONE) 50 MG tablet, Take 1 tablet (50 mg total) by mouth daily with breakfast. ? ?  Current Outpatient Medications (Cardiovascular):  ?  EPINEPHRINE 0.3 mg/0.3 mL IJ SOAJ injection, USE AS DIRECTED FOR LIFE THREATENING ALLERGIC REACTIONS ? ?Current Outpatient Medications (Respiratory):  ?  albuterol (VENTOLIN HFA) 108 (90 Base) MCG/ACT inhaler, Inhale 2 puffs into the lungs every 6 (six) hours as needed for wheezing or shortness of breath. ?  cetirizine (ZYRTEC) 10 MG tablet, Take 20-30 mg by mouth at bedtime.  ?  Fluticasone Furoate 50 MCG/ACT AEPB, Inhale 2 sprays into the lungs daily as needed.  ?  montelukast (SINGULAIR) 10 MG tablet, TAKE 1 TABLET BY MOUTH EVERY DAY ? ?Current Outpatient Medications (Analgesics):  ?  Fremanezumab-vfrm (AJOVY) 225 MG/1.5ML SOSY, INJECT 225 MG UNDER THE SKIN EVERY 30 DAYS ?  HYDROcodone-acetaminophen (NORCO) 7.5-325 MG tablet, Take 0.5-1 tablets by mouth every 6 (six) hours as needed for moderate pain. ?  ibuprofen (ADVIL,MOTRIN) 800 MG tablet, TK 1 T PO   BID PRN ?  meloxicam (MOBIC) 7.5 MG tablet, Take 7.5 mg by mouth 2 (two) times daily as needed. ?  traMADol (ULTRAM) 50 MG tablet, Take 50-100 mg by mouth every 4 (four) hours as needed. ? ? ?Current Outpatient Medications (Other):  ?  amitriptyline (ELAVIL) 10 MG tablet, TAKE 1 TABLET(10 MG) BY MOUTH AT BEDTIME ?  clonazePAM (KLONOPIN) 0.5 MG tablet, Take 0.5 mg by mouth as needed. ?  dicyclomine (BENTYL) 20 MG tablet, Take 1 tablet by mouth as needed. ?  Hyprom-Naphaz-Polysorb-Zn Sulf (CLEAR EYES COMPLETE OP), Place 1 drop into both eyes 2 (two) times daily as needed (dry eyes/itching).  ?  metoCLOPramide (REGLAN) 10 MG tablet, Take 1 tablet (10 mg total) by mouth every 6 (six) hours as needed for nausea. ?  Olopatadine HCl 0.7 % SOLN, Apply 1 drop to eye daily. ?  orphenadrine (NORFLEX) 100 MG tablet, Take 100 mg by mouth 2 (two) times daily as needed for muscle spasms. ?  pregabalin (LYRICA) 150 MG capsule, TAKE 1 CAPSULE(150 MG) BY MOUTH AT BEDTIME ?  Prenatal Vit-Fe Fumarate-FA (PRENATAL VITAMINS PO), Take by mouth. ?  triamcinolone cream (KENALOG) 0.5 %, Apply 1 application topically every 4 (four) hours as needed (rash/allergies).  ?Allergies  ?Allergen Reactions  ? Fish Allergy Anaphylaxis  ? Other Swelling  ?  Mayonnaise causes swelling of the tongue.  ? Penicillins Hives  ?  Has patient had a PCN reaction causing immediate rash, facial/tongue/throat swelling, SOB or lightheadedness with hypotension: Yes ?Has patient had a PCN reaction causing severe rash involving mucus membranes or skin necrosis: No ?Has patient had a PCN reaction that required hospitalization No ?Has patient had a PCN reaction occurring within the last 10 years: Yes ?If all of the above answers are "NO", then may proceed with Cephalosporin use. ? ?  ? Shellfish Allergy Anaphylaxis  ?  Patient is allergic to all seafood. ? ?She states she can take the dye that is given in CT scans even though she has some itching   ? Peanut Butter Flavor  Hives  ? Histamine Other (See Comments)  ?  Patient  tested positive in allergy testing to Control-histamine 1.  ? Voltaren [Diclofenac] Rash and Other (See Comments)  ?  Volatren gel causes blisters

## 2022-02-17 ENCOUNTER — Telehealth: Payer: Self-pay | Admitting: Internal Medicine

## 2022-02-17 NOTE — Telephone Encounter (Signed)
Scheduled appt per 4/6 referral. Pt is aware of appt date and time. Pt is aware to arrive 15 mins prior to appt time and to bring and updated insurance card. Pt is aware of appt location.   ?

## 2022-02-28 ENCOUNTER — Other Ambulatory Visit: Payer: Self-pay | Admitting: Internal Medicine

## 2022-02-28 ENCOUNTER — Telehealth: Payer: Self-pay | Admitting: Hematology and Oncology

## 2022-02-28 NOTE — Telephone Encounter (Signed)
R/s pt's new hem appt per 4/18 secure chat with Dr. Julien Nordmann. Called pt, no answer. Left msg with new appt date and time. Requested for pt to call me back to confirm  change.  ?

## 2022-03-01 ENCOUNTER — Other Ambulatory Visit: Payer: BC Managed Care – PPO

## 2022-03-01 ENCOUNTER — Encounter: Payer: BC Managed Care – PPO | Admitting: Internal Medicine

## 2022-03-02 ENCOUNTER — Telehealth: Payer: Self-pay | Admitting: Hematology and Oncology

## 2022-03-02 NOTE — Telephone Encounter (Signed)
R/s appt per pt request. Pt is aware of new appt date and time.  ?

## 2022-03-08 ENCOUNTER — Other Ambulatory Visit: Payer: Self-pay

## 2022-03-08 ENCOUNTER — Inpatient Hospital Stay: Payer: Managed Care, Other (non HMO) | Attending: Internal Medicine | Admitting: Hematology and Oncology

## 2022-03-08 ENCOUNTER — Other Ambulatory Visit: Payer: Self-pay | Admitting: *Deleted

## 2022-03-08 ENCOUNTER — Inpatient Hospital Stay: Payer: Managed Care, Other (non HMO)

## 2022-03-08 VITALS — BP 121/69 | HR 86 | Temp 97.7°F | Resp 20 | Wt 189.5 lb

## 2022-03-08 DIAGNOSIS — Z803 Family history of malignant neoplasm of breast: Secondary | ICD-10-CM | POA: Diagnosis not present

## 2022-03-08 DIAGNOSIS — O99019 Anemia complicating pregnancy, unspecified trimester: Secondary | ICD-10-CM | POA: Diagnosis not present

## 2022-03-08 DIAGNOSIS — Z8042 Family history of malignant neoplasm of prostate: Secondary | ICD-10-CM | POA: Diagnosis not present

## 2022-03-08 DIAGNOSIS — D500 Iron deficiency anemia secondary to blood loss (chronic): Secondary | ICD-10-CM

## 2022-03-08 DIAGNOSIS — I82402 Acute embolism and thrombosis of unspecified deep veins of left lower extremity: Secondary | ICD-10-CM | POA: Diagnosis not present

## 2022-03-08 DIAGNOSIS — D509 Iron deficiency anemia, unspecified: Secondary | ICD-10-CM | POA: Diagnosis not present

## 2022-03-08 LAB — CBC WITH DIFFERENTIAL (CANCER CENTER ONLY)
Abs Immature Granulocytes: 0.02 10*3/uL (ref 0.00–0.07)
Basophils Absolute: 0 10*3/uL (ref 0.0–0.1)
Basophils Relative: 0 %
Eosinophils Absolute: 0.1 10*3/uL (ref 0.0–0.5)
Eosinophils Relative: 1 %
HCT: 28.8 % — ABNORMAL LOW (ref 36.0–46.0)
Hemoglobin: 8.6 g/dL — ABNORMAL LOW (ref 12.0–15.0)
Immature Granulocytes: 0 %
Lymphocytes Relative: 29 %
Lymphs Abs: 2.5 10*3/uL (ref 0.7–4.0)
MCH: 19.7 pg — ABNORMAL LOW (ref 26.0–34.0)
MCHC: 29.9 g/dL — ABNORMAL LOW (ref 30.0–36.0)
MCV: 66.1 fL — ABNORMAL LOW (ref 80.0–100.0)
Monocytes Absolute: 0.7 10*3/uL (ref 0.1–1.0)
Monocytes Relative: 8 %
Neutro Abs: 5.2 10*3/uL (ref 1.7–7.7)
Neutrophils Relative %: 62 %
Platelet Count: 518 10*3/uL — ABNORMAL HIGH (ref 150–400)
RBC: 4.36 MIL/uL (ref 3.87–5.11)
RDW: 20.7 % — ABNORMAL HIGH (ref 11.5–15.5)
WBC Count: 8.6 10*3/uL (ref 4.0–10.5)
nRBC: 0 % (ref 0.0–0.2)

## 2022-03-08 LAB — CMP (CANCER CENTER ONLY)
ALT: 7 U/L (ref 0–44)
AST: 14 U/L — ABNORMAL LOW (ref 15–41)
Albumin: 4 g/dL (ref 3.5–5.0)
Alkaline Phosphatase: 41 U/L (ref 38–126)
Anion gap: 7 (ref 5–15)
BUN: 12 mg/dL (ref 6–20)
CO2: 21 mmol/L — ABNORMAL LOW (ref 22–32)
Calcium: 9.8 mg/dL (ref 8.9–10.3)
Chloride: 106 mmol/L (ref 98–111)
Creatinine: 0.48 mg/dL (ref 0.44–1.00)
GFR, Estimated: >60 mL/min (ref >60)
Glucose, Bld: 90 mg/dL (ref 70–99)
Potassium: 3.8 mmol/L (ref 3.5–5.1)
Sodium: 134 mmol/L — ABNORMAL LOW (ref 135–145)
Total Bilirubin: 0.3 mg/dL (ref 0.3–1.2)
Total Protein: 7.4 g/dL (ref 6.5–8.1)

## 2022-03-08 LAB — FOLATE: Folate: 11.8 ng/mL (ref >5.9)

## 2022-03-08 LAB — IRON AND IRON BINDING CAPACITY (CC-WL,HP ONLY)
Iron: 23 ug/dL — ABNORMAL LOW (ref 28–170)
Saturation Ratios: 4 % — ABNORMAL LOW (ref 10.4–31.8)
TIBC: 518 ug/dL — ABNORMAL HIGH (ref 250–450)
UIBC: 495 ug/dL — ABNORMAL HIGH (ref 148–442)

## 2022-03-08 LAB — RETIC PANEL
Immature Retic Fract: 28.2 % — ABNORMAL HIGH (ref 2.3–15.9)
RBC.: 4.35 MIL/uL (ref 3.87–5.11)
Retic Count, Absolute: 52.6 10*3/uL (ref 19.0–186.0)
Retic Ct Pct: 1.2 % (ref 0.4–3.1)
Reticulocyte Hemoglobin: 23.1 pg — ABNORMAL LOW (ref >27.9)

## 2022-03-08 LAB — FERRITIN: Ferritin: 2 ng/mL — ABNORMAL LOW (ref 11–307)

## 2022-03-08 LAB — VITAMIN B12: Vitamin B-12: 386 pg/mL (ref 180–914)

## 2022-03-08 NOTE — Progress Notes (Signed)
?[place] ?[phone]   [phone] ? ?INITIAL CONSULT NOTE ? ?Patient Care Team: ?Cher Nakai, MD as PCP - General (Internal Medicine) ? ?Hematological/Oncological History ?# Iron Deficiency Anemia in Pregnancy ?# Provoked Left Lower Extremity DVT, in setting of estrogen containing OCPs.  ?03/08/2022: establish care with Dr. Lorenso Courier  ? ?CHIEF COMPLAINTS/PURPOSE OF CONSULTATION:  ?"Iron Deficiency Anemia  " ? ?HISTORY OF PRESENTING ILLNESS:  ?Nina Wood 36 y.o. female with medical history significant for asthma, allergies, GERD, fibromyalgia, who presents for evaluation of DVT prophylaxis in pregnancy and iron deficiency anemia. ? ?On exam today Nina Wood notes that she is currently tolerating her Lovenox prophylaxis well.  She notes that she is not particularly enjoy getting the shot in the abdomen particularly on the right side.  She notes that her prior blood clot in 2017 was in the left lower extremity and she was placed on Coumadin at that time but due to bleeding with switch to different anticoagulation.  She notes that was thought to be secondary to her low estrogen birth control.  She notes that she does have a prior history of iron deficiency anemia with heavy menstrual cycles prior to becoming pregnant.  She notes that sometimes her periods would last up to 2 weeks and that she would have to change pads and tampons prefaces both used together) every 3-4 hours.  She had taken iron pills before in the past but they caused constipation.  She notes that she is prone to occasional nosebleeds and gum bleeding but thinks that she needs to see a dentist. ? ?On further discussion she has a family history markable for congestive heart failure in her mother and prostate cancer in her father.  She notes that her mother is also a breast cancer survivor and has CKD.  She has 2 healthy children ages 55 and 73.  She also notes that she does not smoke any cigarettes and used to drink  some wine prior to pregnancy.  She is currently employed as a Radiation protection practitioner for [employer].  Otherwise she currently denies any fevers, chills, sweats, nausea, vomiting or diarrhea.  Full 10 point ROS is listed below. ? ?MEDICAL HISTORY:  ?Past Medical History:  ?Diagnosis Date  ? Allergic rhinitis   ? Allergy to cats   ? Allergy to dogs   ? Anemia   ? Anxiety   ? Asthma   ? BMI 37.0-37.9, adult   ? Breast cyst   ? Chronic pain syndrome   ? Deep vein thrombosis (DVT) (HCC)   ? Endometriosis, uterus   ? Environmental allergies   ? Fibromyalgia syndrome   ? Generalized osteoarthrosis   ? GERD (gastroesophageal reflux disease)   ? Headache   ? Hip discomfort   ? IBS (irritable bowel syndrome)   ? with diarrhea  ? Obesity   ? Ovarian cyst   ? Paroxysmal dyspnea   ? PTSD (post-traumatic stress disorder)   ? Patient reported  ? Uterine fibroid   ? ? ?SURGICAL HISTORY: ?Past Surgical History:  ?Procedure Laterality Date  ? APPENDECTOMY  2003  ? CESAREAN SECTION    ? DILATION AND CURETTAGE OF UTERUS    ? 1 WEEK AGO  ? DILATION AND EVACUATION Bilateral 05/18/2016  ? Procedure: DILATATION AND EVACUATION;  Surgeon: Bobbye Charleston, MD;  Location: [facility];  Service: Gynecology;  Laterality: Bilateral;  ? ECTOPIC PREGNANCY SURGERY    ? etopi    ? GASTRIC BYPASS  03/14/2012  ? INDUCED ABORTION    ? MYOMECTOMY  06/23/2020  ? OVARIAN CYST SURGERY    ? ? ?SOCIAL HISTORY: ?Social History  ? ?Socioeconomic History  ? Marital status: Single  ?  Spouse name: Not on file  ? Number of children: 3  ? Years of education: Not on file  ? Highest education level: Bachelor's degree (e.g., BA, AB, BS)  ?Occupational History  ? Not on file  ?Tobacco Use  ? Smoking status: Never  ? Smokeless tobacco: Never  ?Vaping Use  ? Vaping Use: Never used  ?Substance and Sexual Activity  ? Alcohol use: Not Currently  ? Drug use: Not Currently  ?  Types: Marijuana  ?  Comment: 09/23/20-states "about a week ago"  ? Sexual activity: Yes  ?Other  Topics Concern  ? Not on file  ?Social History Narrative  ? Lives at home with her children  ? Right handed  ? Caffeine:  At least 4 venti size coffees per day, doesn't drink tea much, drinks 48 oz water daily  ? ?Social Determinants of Health  ? ?Financial Resource Strain: Not on file  ?Food Insecurity: Not on file  ?Transportation Needs: Not on file  ?Physical Activity: Not on file  ?Stress: Not on file  ?Social Connections: Not on file  ?Intimate Partner Violence: Not on file  ? ? ?FAMILY HISTORY: ?Family History  ?Problem Relation Age of Onset  ? Hypertension Mother   ? Breast cancer Mother   ? Prostate cancer Father   ? Hypertension Maternal Uncle   ? Diabetes Paternal Aunt   ? Cancer Paternal Aunt   ? Cancer Maternal Grandmother   ? Diabetes Sister   ? Neuropathy Neg Hx   ?     none pt is aware of  ? ? ?ALLERGIES:  is allergic to fish allergy, other, penicillins, shellfish allergy, peanut butter flavor, histamine, and voltaren [diclofenac]. ? ?MEDICATIONS:  ?Current Outpatient Medications  ?Medication Sig Dispense Refill  ? Albuterol Sulfate, sensor, (PROAIR DIGIHALER) 108 (90 Base) MCG/ACT AEPB Proair Digihaler 90 mcg/actuation aerosol powder breath act, sensor ? INHALE 2 PUFFS EVERY 4 HOURS AS NEEDED.    ? cetirizine (ZYRTEC) 10 MG tablet Take 20-30 mg by mouth at bedtime.     ? Doxylamine-Pyridoxine 10-10 MG TBEC doxylamine 10 mg-pyridoxine (vit B6) 10 mg tablet,delayed release ? TAKE 2 TABLETS BY MOUTH EVERY DAY    ? enoxaparin (LOVENOX) 40 MG/0.4ML injection enoxaparin 40 mg/0.4 mL subcutaneous syringe ? Inject 0.4 mL twice a day by subcutaneous route.    ? EPINEPHRINE 0.3 mg/0.3 mL IJ SOAJ injection USE AS DIRECTED FOR LIFE THREATENING ALLERGIC REACTIONS 4 each 0  ? Fremanezumab-vfrm (AJOVY) 225 MG/1.5ML SOSY INJECT 225 MG UNDER THE SKIN EVERY 30 DAYS 1.5 mL 11  ? Hyprom-Naphaz-Polysorb-Zn Sulf (CLEAR EYES COMPLETE OP) Place 1 drop into both eyes 2 (two) times daily as needed (dry eyes/itching).     ?  metroNIDAZOLE (METROGEL) 0.75 % vaginal gel metronidazole 0.75 % (37.5 mg/5 gram) vaginal gel ? INSERT 1 APPLICATORFUL VAGINALLY EVERY DAY AT BEDTIME FOR 5 DAYS    ? montelukast (SINGULAIR) 10 MG tablet TAKE 1 TABLET BY MOUTH EVERY DAY 30 tablet 11  ? Prenatal Vit-Fe Fumarate-FA (PRENATAL VITAMINS PO) Take by mouth.    ? triamcinolone cream (KENALOG) 0.5 % Apply 1 application topically every 4 (four) hours as needed (rash/allergies).     ? ?No current facility-administered medications for this visit.  ? ? ?REVIEW OF SYSTEMS:   ?  Constitutional: ( - ) fevers, ( - )  chills , ( - ) night sweats ?Eyes: ( - ) blurriness of vision, ( - ) double vision, ( - ) watery eyes ?Ears, nose, mouth, throat, and face: ( - ) mucositis, ( - ) sore throat ?Respiratory: ( - ) cough, ( - ) dyspnea, ( - ) wheezes ?Cardiovascular: ( - ) palpitation, ( - ) chest discomfort, ( - ) lower extremity swelling ?Gastrointestinal:  ( - ) nausea, ( - ) heartburn, ( - ) change in bowel habits ?Skin: ( - ) abnormal skin rashes ?Lymphatics: ( - ) new lymphadenopathy, ( - ) easy bruising ?Neurological: ( - ) numbness, ( - ) tingling, ( - ) new weaknesses ?Behavioral/Psych: ( - ) mood change, ( - ) new changes  ?All other systems were reviewed with the patient and are negative. ? ?PHYSICAL EXAMINATION: ? ?Vitals:  ? 03/08/22 0916  ?BP: 121/69  ?Pulse: 86  ?Resp: 20  ?Temp: 97.7 ?F (36.5 ?C)  ?SpO2: 100%  ? ?Filed Weights  ? 03/08/22 0916  ?Weight: 189 lb 8 oz (86 kg)  ? ? ?GENERAL: well appearing middle-aged African-American female in NAD  ?SKIN: skin color, texture, turgor are normal, no rashes or significant lesions ?EYES: conjunctiva are pink and non-injected, sclera clear ?LUNGS: clear to auscultation and percussion with normal breathing effort ?HEART: regular rate & rhythm and no murmurs and no lower extremity edema ?Musculoskeletal: no cyanosis of digits and no clubbing  ?PSYCH: alert & oriented x 3, fluent speech ?NEURO: no focal motor/sensory  deficits ? ?LABORATORY DATA:  ?I have reviewed the data as listed ? ?  Latest Ref Rng & Units 04/07/2017  ? 12:32 AM 02/24/2017  ?  2:24 PM 07/25/2016  ?  7:30 PM  ?CBC  ?WBC 4.0 - 10.5 K/uL 6.1   6.3   8.6

## 2022-03-13 ENCOUNTER — Telehealth: Payer: Self-pay | Admitting: Hematology and Oncology

## 2022-03-13 NOTE — Telephone Encounter (Signed)
Per 4/30 in basket called and left pt a message about appointment.  Call back number left if changes are needed ?

## 2022-03-14 ENCOUNTER — Telehealth: Payer: Self-pay | Admitting: *Deleted

## 2022-03-14 ENCOUNTER — Other Ambulatory Visit: Payer: Self-pay | Admitting: *Deleted

## 2022-03-14 ENCOUNTER — Encounter: Payer: Self-pay | Admitting: Hematology and Oncology

## 2022-03-14 DIAGNOSIS — Z86718 Personal history of other venous thrombosis and embolism: Secondary | ICD-10-CM

## 2022-03-14 NOTE — Telephone Encounter (Signed)
Attempted to contact patient via phone in response to earlier [employer]. Left voice mail with following information: ?Dr. Lorenso Courier has ordered a right lower extremity ultrasound to make sure there is no DVT.  ?[facility] will contact you to schedule this as soon as possible. ?Please contact the office if you have any questions or further concerns. ? ?Sent same info via MyChart ? ?

## 2022-03-15 ENCOUNTER — Other Ambulatory Visit: Payer: BC Managed Care – PPO

## 2022-03-15 ENCOUNTER — Encounter: Payer: BC Managed Care – PPO | Admitting: Hematology and Oncology

## 2022-03-15 ENCOUNTER — Ambulatory Visit (HOSPITAL_COMMUNITY)
Admission: RE | Admit: 2022-03-15 | Discharge: 2022-03-15 | Disposition: A | Discharge status: Home / Self Care | Payer: Managed Care, Other (non HMO) | Source: Ambulatory Visit | Attending: Hematology and Oncology | Admitting: Hematology and Oncology

## 2022-03-15 ENCOUNTER — Telehealth: Payer: Self-pay | Admitting: *Deleted

## 2022-03-15 DIAGNOSIS — Z8759 Personal history of other complications of pregnancy, childbirth and the puerperium: Secondary | ICD-10-CM | POA: Diagnosis not present

## 2022-03-15 DIAGNOSIS — Z86718 Personal history of other venous thrombosis and embolism: Secondary | ICD-10-CM | POA: Insufficient documentation

## 2022-03-15 NOTE — Telephone Encounter (Signed)
TCT patient per pt request via [place].  Spoke with pt . She wanted to make sure Dr. Lorenso Courier wanted to do the [place] of her leg when the spasm is in her upper leg. Advised that he did, as a clot could be in her upper leg -they are not only in lower extremities. Pt voiced understanding. She is aware of her appt today @ 4 pm and then her 1st iron infusion is scheduled for 5/12. ?No further questions or concerns. ?

## 2022-03-15 NOTE — Progress Notes (Signed)
Lower extremity venous duplex has been completed.  ? ?Preliminary results in CV Proc.  ? ?Nina Wood ?03/15/2022 4:33 PM    ?

## 2022-03-16 ENCOUNTER — Telehealth: Payer: Self-pay | Admitting: *Deleted

## 2022-03-16 NOTE — Telephone Encounter (Signed)
TCT patient regarding results of her right lower extremity US. ?Spoke with pt. Advised that the [place] was negative for DVT. ?Pt states she is still have muscle spasms in that leg. Advised that she should discuss with her OB provider. Advised tht as it was not a DVT or related to her Lovenox/anemia that perhaps her OB can address this.  Suggested heating pad to area involved or gentle massage..  Pt voiced understanding. ?

## 2022-03-16 NOTE — Telephone Encounter (Signed)
-----   Message from Orson Slick, MD sent at 03/16/2022  8:57 AM EDT ----- ?Please let Mrs. Jenniges know that her US showed no evidence of a DVT.  ?----- Message ----- ?From: [employer] [employer] ?Sent: 03/15/2022   4:34 PM EDT ?To: Orson Slick, MD ? ? ?

## 2022-03-24 ENCOUNTER — Encounter (HOSPITAL_COMMUNITY)
Admission: RE | Admit: 2022-03-24 | Discharge: 2022-03-24 | Disposition: A | Discharge status: Home / Self Care | Payer: Managed Care, Other (non HMO) | Source: Ambulatory Visit | Attending: Hematology and Oncology | Admitting: Hematology and Oncology

## 2022-03-24 ENCOUNTER — Other Ambulatory Visit (HOSPITAL_COMMUNITY): Payer: Self-pay | Admitting: *Deleted

## 2022-03-24 DIAGNOSIS — D500 Iron deficiency anemia secondary to blood loss (chronic): Secondary | ICD-10-CM | POA: Insufficient documentation

## 2022-03-24 MED ORDER — SODIUM CHLORIDE 0.9 % IV SOLN
510.0000 mg | INTRAVENOUS | Status: DC
  Administered 2022-03-24: 510 mg via INTRAVENOUS
  Filled 2022-03-24: qty 510

## 2022-03-31 ENCOUNTER — Encounter (HOSPITAL_COMMUNITY)
Admission: RE | Admit: 2022-03-31 | Discharge: 2022-03-31 | Disposition: A | Discharge status: Home / Self Care | Payer: Managed Care, Other (non HMO) | Source: Ambulatory Visit | Attending: Hematology and Oncology | Admitting: Hematology and Oncology

## 2022-03-31 ENCOUNTER — Encounter (HOSPITAL_COMMUNITY): Payer: Managed Care, Other (non HMO)

## 2022-03-31 DIAGNOSIS — D500 Iron deficiency anemia secondary to blood loss (chronic): Secondary | ICD-10-CM | POA: Diagnosis not present

## 2022-03-31 MED ORDER — FERUMOXYTOL INJECTION 510 MG/17 ML
510.0000 mg | Freq: Once | INTRAVENOUS | Status: AC
  Administered 2022-03-31: 510 mg via INTRAVENOUS
  Filled 2022-03-31: qty 510

## 2022-04-03 ENCOUNTER — Other Ambulatory Visit: Payer: Self-pay | Admitting: Obstetrics and Gynecology

## 2022-04-03 ENCOUNTER — Other Ambulatory Visit: Payer: Self-pay

## 2022-04-03 DIAGNOSIS — Z363 Encounter for antenatal screening for malformations: Secondary | ICD-10-CM

## 2022-04-06 ENCOUNTER — Ambulatory Visit: Payer: BLUE CROSS/BLUE SHIELD | Attending: Obstetrics

## 2022-04-06 ENCOUNTER — Encounter: Payer: Self-pay | Admitting: *Deleted

## 2022-04-06 ENCOUNTER — Ambulatory Visit (HOSPITAL_BASED_OUTPATIENT_CLINIC_OR_DEPARTMENT_OTHER): Payer: Managed Care, Other (non HMO) | Admitting: Obstetrics

## 2022-04-06 ENCOUNTER — Ambulatory Visit: Payer: Managed Care, Other (non HMO) | Admitting: Obstetrics

## 2022-04-06 ENCOUNTER — Ambulatory Visit: Payer: Managed Care, Other (non HMO) | Admitting: *Deleted

## 2022-04-06 ENCOUNTER — Other Ambulatory Visit: Payer: Self-pay | Admitting: *Deleted

## 2022-04-06 ENCOUNTER — Ambulatory Visit: Payer: Managed Care, Other (non HMO) | Attending: Obstetrics and Gynecology

## 2022-04-06 VITALS — BP 118/55 | HR 89

## 2022-04-06 DIAGNOSIS — O09522 Supervision of elderly multigravida, second trimester: Secondary | ICD-10-CM

## 2022-04-06 DIAGNOSIS — Z3A17 17 weeks gestation of pregnancy: Secondary | ICD-10-CM

## 2022-04-06 DIAGNOSIS — O30012 Twin pregnancy, monochorionic/monoamniotic, second trimester: Secondary | ICD-10-CM | POA: Diagnosis not present

## 2022-04-06 DIAGNOSIS — O365921 Maternal care for other known or suspected poor fetal growth, second trimester, fetus 1: Secondary | ICD-10-CM

## 2022-04-06 DIAGNOSIS — Z363 Encounter for antenatal screening for malformations: Secondary | ICD-10-CM | POA: Diagnosis present

## 2022-04-06 DIAGNOSIS — O09892 Supervision of other high risk pregnancies, second trimester: Secondary | ICD-10-CM | POA: Diagnosis not present

## 2022-04-06 DIAGNOSIS — O30009 Twin pregnancy, unspecified number of placenta and unspecified number of amniotic sacs, unspecified trimester: Secondary | ICD-10-CM

## 2022-04-06 DIAGNOSIS — Z86718 Personal history of other venous thrombosis and embolism: Secondary | ICD-10-CM

## 2022-04-11 NOTE — Progress Notes (Signed)
MFM Note  Nina Wood was seen due to a spontaneously conceived twin gestation and advanced maternal age.  During a recent ultrasound performed in your office, twin A was noted to be smaller than twin B.  Her pregnancy has also been complicated by a history of DVT in her left leg in 2017 and 2018.  Due to this history, she is currently treated with Lovenox 40 mg daily.  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  She also reports she that she had laparoscopic surgery to remove fibroids and to treat endometriosis.  On today's exam, twin A measured at less than the 1st percentile for her gestational age. Twin B measured at the 24th percentile.    A dividing membrane could not be visualized separating the two fetuses.  The patient was advised that she may have a set of monochorionic, monoamniotic twins or this could be a monochorionic twin gestation with selective IUGR of twin A with anhydramnios in twin A.  If anhydramnios is present in twin A, the dividing membrane may be stuck to twin A and therefore may not be visualized.    Doppler studies of the umbilical arteries performed today shows normal forward flow in both fetuses.  There were no signs of absent or reversed end-diastolic flow in either fetus.  The following were discussed during today's consultation:  Monochorionic, monoamniotic twins versus monochorionic twins with selective IUGR of twin A with anhydramnios   The management of a monoamniotic twin gestation was discussed today.  She was advised regarding the increased risk of fetal demise in a monoamniotic twin gestation due to cord entanglement.  She was advised that the usual management of a monoamniotic twin gestation will involve inpatient management with daily fetal testing starting at around 24 weeks with cesarean delivery at around 32 weeks.  She was advised that should this  be a monochorionic twin gestation with selective IUGR, that there may be an increased risk of fetal demise of the smaller fetus with subsequent adverse effects including brain damage in the surviving fetus.  She understands that should this be a case of selective IUGR with anhydramnios of twin A, that selective reduction of the smaller twin may be a treatment option.  I will make a referral to Dr. Leward Quan at the [facility] for a second opinion and to discuss further management options.  The patient and her partner have stated that selective reduction is probably not an option for them.  Advanced maternal age in pregnancy  Due to advanced maternal age, the patient was offered and declined an amniocentesis for definitive diagnosis of fetal aneuploidy.    The patient apparently had a cell free DNA test drawn earlier in her pregnancy.  However, as only a singleton gestation was reported, no results are available.  Our genetic counselor contacted the lab and is having them recalculate the Down syndrome risk based on the twin gestation.  History of prior DVTs   Due to her history of DVTs, she should treated with prophylactic anticoagulation for the duration of her pregnancy.    The patient reports that she weighs 192 pounds (approximately 80 kg). I would recommend that her Lovenox dose be increased to 80 mg daily.    As she will only be treated with a prophylactic dose of Lovenox, she will be eligible to receive regional anesthesia 12 hours  following the last dose.  A detailed fetal anatomy scan has been scheduled for her in 2 weeks.    The patient and her partner stated that all their questions were answered today.  A total of 45 minutes was spent counseling and coordinating the care for this patient. Greater than 50% of the time was spent in direct face-to-face contact.

## 2022-04-13 ENCOUNTER — Other Ambulatory Visit: Payer: Self-pay | Admitting: *Deleted

## 2022-04-13 DIAGNOSIS — O30009 Twin pregnancy, unspecified number of placenta and unspecified number of amniotic sacs, unspecified trimester: Secondary | ICD-10-CM

## 2022-04-13 DIAGNOSIS — O365921 Maternal care for other known or suspected poor fetal growth, second trimester, fetus 1: Secondary | ICD-10-CM

## 2022-04-14 ENCOUNTER — Other Ambulatory Visit: Payer: Self-pay | Admitting: *Deleted

## 2022-04-14 DIAGNOSIS — O365921 Maternal care for other known or suspected poor fetal growth, second trimester, fetus 1: Secondary | ICD-10-CM

## 2022-04-14 DIAGNOSIS — O30002 Twin pregnancy, unspecified number of placenta and unspecified number of amniotic sacs, second trimester: Secondary | ICD-10-CM

## 2022-04-19 ENCOUNTER — Ambulatory Visit: Payer: BLUE CROSS/BLUE SHIELD | Attending: Obstetrics

## 2022-04-19 ENCOUNTER — Ambulatory Visit: Payer: BLUE CROSS/BLUE SHIELD

## 2022-05-04 ENCOUNTER — Encounter: Payer: Self-pay | Admitting: Hematology and Oncology

## 2022-05-04 ENCOUNTER — Encounter: Payer: Self-pay | Admitting: Internal Medicine

## 2022-05-04 ENCOUNTER — Telehealth: Payer: Self-pay

## 2022-05-05 ENCOUNTER — Inpatient Hospital Stay: Payer: Managed Care, Other (non HMO) | Admitting: Hematology and Oncology

## 2022-05-05 ENCOUNTER — Telehealth: Payer: Self-pay | Admitting: Hematology and Oncology

## 2022-05-05 ENCOUNTER — Inpatient Hospital Stay: Payer: Managed Care, Other (non HMO)

## 2022-05-05 ENCOUNTER — Other Ambulatory Visit: Payer: Self-pay | Admitting: Hematology and Oncology

## 2022-05-05 DIAGNOSIS — Z86718 Personal history of other venous thrombosis and embolism: Secondary | ICD-10-CM

## 2022-05-09 ENCOUNTER — Ambulatory Visit: Payer: BLUE CROSS/BLUE SHIELD | Admitting: Hematology and Oncology

## 2022-05-09 ENCOUNTER — Inpatient Hospital Stay: Payer: Managed Care, Other (non HMO) | Admitting: Physician Assistant

## 2022-05-09 ENCOUNTER — Other Ambulatory Visit: Payer: BLUE CROSS/BLUE SHIELD

## 2022-05-09 ENCOUNTER — Inpatient Hospital Stay: Payer: Managed Care, Other (non HMO)

## 2022-05-22 ENCOUNTER — Encounter: Payer: Self-pay | Admitting: Physician Assistant

## 2022-05-23 ENCOUNTER — Inpatient Hospital Stay: Payer: Managed Care, Other (non HMO) | Admitting: Physician Assistant

## 2022-05-23 ENCOUNTER — Inpatient Hospital Stay: Payer: Managed Care, Other (non HMO)

## 2022-05-24 ENCOUNTER — Telehealth: Payer: Self-pay | Admitting: Hematology and Oncology

## 2022-05-24 NOTE — Telephone Encounter (Signed)
.  Called pt per 7/11 inbasket , Patient was unavailable, a message with appt time and date was left with number on file.

## 2022-06-11 ENCOUNTER — Other Ambulatory Visit: Payer: Self-pay

## 2022-06-11 ENCOUNTER — Encounter (HOSPITAL_COMMUNITY): Payer: Self-pay | Admitting: Obstetrics and Gynecology

## 2022-06-11 ENCOUNTER — Inpatient Hospital Stay (HOSPITAL_COMMUNITY)
Admission: AD | Admit: 2022-06-11 | Discharge: 2022-06-11 | Disposition: A | Discharge status: Home / Self Care | Payer: Managed Care, Other (non HMO) | Attending: Obstetrics and Gynecology | Admitting: Obstetrics and Gynecology

## 2022-06-11 ENCOUNTER — Inpatient Hospital Stay (HOSPITAL_COMMUNITY): Payer: Managed Care, Other (non HMO)

## 2022-06-11 DIAGNOSIS — O30032 Twin pregnancy, monochorionic/diamniotic, second trimester: Secondary | ICD-10-CM | POA: Insufficient documentation

## 2022-06-11 DIAGNOSIS — Z86718 Personal history of other venous thrombosis and embolism: Secondary | ICD-10-CM | POA: Insufficient documentation

## 2022-06-11 DIAGNOSIS — Z3A26 26 weeks gestation of pregnancy: Secondary | ICD-10-CM | POA: Diagnosis not present

## 2022-06-11 DIAGNOSIS — O26892 Other specified pregnancy related conditions, second trimester: Secondary | ICD-10-CM | POA: Diagnosis present

## 2022-06-11 DIAGNOSIS — M797 Fibromyalgia: Secondary | ICD-10-CM | POA: Insufficient documentation

## 2022-06-11 DIAGNOSIS — Z3689 Encounter for other specified antenatal screening: Secondary | ICD-10-CM

## 2022-06-11 DIAGNOSIS — G5792 Unspecified mononeuropathy of left lower limb: Secondary | ICD-10-CM | POA: Insufficient documentation

## 2022-06-11 DIAGNOSIS — M25572 Pain in left ankle and joints of left foot: Secondary | ICD-10-CM

## 2022-06-11 DIAGNOSIS — O99352 Diseases of the nervous system complicating pregnancy, second trimester: Secondary | ICD-10-CM | POA: Diagnosis not present

## 2022-06-11 HISTORY — DX: Benign neoplasm of brain, unspecified: D33.2

## 2022-06-11 HISTORY — DX: Acute embolism and thrombosis of unspecified deep veins of unspecified lower extremity: I82.409

## 2022-06-11 MED ORDER — ACETAMINOPHEN 500 MG PO TABS
1000.0000 mg | ORAL_TABLET | Freq: Once | ORAL | Status: AC
  Administered 2022-06-11: 1000 mg via ORAL
  Filled 2022-06-11: qty 2

## 2022-06-11 MED ORDER — CAPSAICIN 0.025 % EX CREA
TOPICAL_CREAM | Freq: Two times a day (BID) | CUTANEOUS | Status: DC
  Filled 2022-06-11: qty 60

## 2022-06-11 NOTE — Progress Notes (Signed)
VASCULAR LAB    Left lower extremity venous duplex has been performed.  See CV proc for preliminary results.   Esaiah Wanless, RVT 06/11/2022, 4:04 PM

## 2022-06-11 NOTE — Discharge Instructions (Signed)
Discuss Lyrica, Gabapentin, Cymbalta for nerve pain with OB/MFM

## 2022-06-11 NOTE — MAU Provider Note (Signed)
History     CSN: [id number]  Arrival date and time: 06/11/22 1144   Event Date/Time   First Provider Initiated Contact with Patient 06/11/22 1320      Chief Complaint  Patient presents with   Leg Pain   Nina Wood is a 36 y.o. [id number] at 75w3dwith a history of fibromyalgia and neuropthy who presents to MAU for leg pain. Patient states between the 17th/18th in the middle of the night, she felt like a "pile of bricks" was laying on her left leg and she could not move it. She reports she had been off of her blood thinner for two weeks because of a pharmacy issue, and started taking it daily again on 7/20. Patient states she no longer has that "pile of bricks" feeling, but does have pain and a tingling sensation moving throughout her leg that started the 18th, went away, but then returned a couple of days ago. Patient states the pain hovers around her ankle, but as of yesterday has felt the pain shoot from her ankle, through her calf, up to her hip. Patient describes this as a tingling/fire sensation that is intermittent, sharp pain comes and goes also. Patient states she has been using heat/ice/Tylenol for symptoms which help, but do not take it all away. Walking makes everything worse and she feels like she is walking with a limp.  Patient states this does not feel like her previous blood clots. She states that she had a DVT in her left lower leg both times and has vein damage in that area from her initial blood clot. Patient developed neuropathy from the damage to her vein and was being treated with medication for this prior to pregnancy. Patient states she was on Lyrica for her neuropathy prior to pregnancy.  Patient states her OB and neurologist are aware of this pain and she was told to come to MAU if it worsened for evaluation of new DVT and blood flow issues.  Patient's partner present for entire visit.  Pt denies VB, LOF, ctx, decreased FM, vaginal discharge/odor/itching. Pt  denies N/V, abdominal pain, constipation, diarrhea, or urinary problems. Pt denies fever, chills, fatigue, sweating or changes in appetite. Pt denies SOB or chest pain. Pt denies dizziness, HA, light-headedness, weakness.  Problems this pregnancy include: mono-di twins, twin-twin transfusion, hx DVT, anemia, low-grade glioma. Allergies? Mayonnaise, PCN, shellfish, peanut butter, histamine, Voltaren Current medications/supplements? Lovenox, Tylenol, lidocaine gel, cetirizine, montelukast Prenatal care provider? [facility], next appt 06/16/2022    OB History     Gravida  11   Para  2   Term  2   Preterm  0   AB  8   Living  2      SAB  6   IAB  1   Ectopic  1   Multiple  0   Live Births  2           Past Medical History:  Diagnosis Date   Allergic rhinitis    Allergy to cats    Allergy to dogs    Anemia    Anxiety    Asthma    BMI 37.0-37.9, adult    Brain tumor (benign) (HCC)    Breast cyst    Chronic pain syndrome    Deep vein thrombosis (DVT) (HCC)    DVT (deep venous thrombosis) (HCC)    Endometriosis, uterus    Environmental allergies    Fibromyalgia syndrome    Generalized osteoarthrosis  GERD (gastroesophageal reflux disease)    Headache    Hip discomfort    IBS (irritable bowel syndrome)    with diarrhea   Obesity    Ovarian cyst    Paroxysmal dyspnea    PTSD (post-traumatic stress disorder)    Patient reported   Uterine fibroid     Past Surgical History:  Procedure Laterality Date   APPENDECTOMY  2003   CESAREAN SECTION     DILATION AND CURETTAGE OF UTERUS     1 WEEK AGO   DILATION AND EVACUATION Bilateral 05/18/2016   Procedure: DILATATION AND EVACUATION;  Surgeon: Bobbye Charleston, MD;  Location: [facility];  Service: Gynecology;  Laterality: Bilateral;   ECTOPIC PREGNANCY SURGERY     etopi     GASTRIC BYPASS  03/14/2012   INDUCED ABORTION     MYOMECTOMY  06/23/2020   OTHER SURGICAL HISTORY     surgical removal of nexplanon    OVARIAN CYST SURGERY      Family History  Problem Relation Age of Onset   Hypertension Mother    Breast cancer Mother    Prostate cancer Father    Hypertension Maternal Uncle    Diabetes Paternal Aunt    Cancer Paternal Aunt    Cancer Maternal Grandmother    Diabetes Sister    Neuropathy Neg Hx        none pt is aware of    Social History   Tobacco Use   Smoking status: Never   Smokeless tobacco: Never  Vaping Use   Vaping Use: Never used  Substance Use Topics   Alcohol use: Not Currently   Drug use: Not Currently    Types: Marijuana    Comment: 09/23/20-states "about a week ago"    Allergies:  Allergies  Allergen Reactions   Fish Allergy Anaphylaxis   Other Swelling    Mayonnaise causes swelling of the tongue.   Penicillins Hives    Has patient had a PCN reaction causing immediate rash, facial/tongue/throat swelling, SOB or lightheadedness with hypotension: Yes Has patient had a PCN reaction causing severe rash involving mucus membranes or skin necrosis: No Has patient had a PCN reaction that required hospitalization No Has patient had a PCN reaction occurring within the last 10 years: Yes If all of the above answers are "NO", then may proceed with Cephalosporin use.     Shellfish Allergy Anaphylaxis    Patient is allergic to all seafood.  She states she can take the dye that is given in CT scans even though she has some itching    Peanut Butter Flavor Hives   Histamine Other (See Comments)    Patient  tested positive in allergy testing to Control-histamine 1.   Voltaren [Diclofenac] Rash and Other (See Comments)    Volatren gel causes blisters.    Medications Prior to Admission  Medication Sig Dispense Refill Last Dose   Albuterol Sulfate, sensor, (PROAIR DIGIHALER) 108 (90 Base) MCG/ACT AEPB Proair Digihaler 90 mcg/actuation aerosol powder breath act, sensor  INHALE 2 PUFFS EVERY 4 HOURS AS NEEDED.      cetirizine (ZYRTEC) 10 MG tablet Take 20-30 mg by  mouth at bedtime.       Doxylamine-Pyridoxine 10-10 MG TBEC doxylamine 10 mg-pyridoxine (vit B6) 10 mg tablet,delayed release  TAKE 2 TABLETS BY MOUTH EVERY DAY      enoxaparin (LOVENOX) 40 MG/0.4ML injection enoxaparin 40 mg/0.4 mL subcutaneous syringe  Inject 0.4 mL twice a day by subcutaneous route.  EPINEPHRINE 0.3 mg/0.3 mL IJ SOAJ injection USE AS DIRECTED FOR LIFE THREATENING ALLERGIC REACTIONS 4 each 0    Fremanezumab-vfrm (AJOVY) 225 MG/1.5ML SOSY INJECT 225 MG UNDER THE SKIN EVERY 30 DAYS 1.5 mL 11    Hyprom-Naphaz-Polysorb-Zn Sulf (CLEAR EYES COMPLETE OP) Place 1 drop into both eyes 2 (two) times daily as needed (dry eyes/itching).       metroNIDAZOLE (METROGEL) 0.75 % vaginal gel metronidazole 0.75 % (37.5 mg/5 gram) vaginal gel  INSERT 1 APPLICATORFUL VAGINALLY EVERY DAY AT BEDTIME FOR 5 DAYS (Patient not taking: Reported on 04/06/2022)      montelukast (SINGULAIR) 10 MG tablet TAKE 1 TABLET BY MOUTH EVERY DAY 30 tablet 11    Prenatal Vit-Fe Fumarate-FA (PRENATAL VITAMINS PO) Take by mouth.      triamcinolone cream (KENALOG) 0.5 % Apply 1 application topically every 4 (four) hours as needed (rash/allergies).        Review of Systems  Constitutional:  Negative for chills, diaphoresis, fatigue and fever.  Eyes:  Negative for visual disturbance.  Respiratory:  Negative for shortness of breath.   Cardiovascular:  Negative for chest pain.  Gastrointestinal:  Negative for abdominal pain, constipation, diarrhea, nausea and vomiting.  Genitourinary:  Negative for dysuria, flank pain, frequency, pelvic pain, urgency, vaginal bleeding and vaginal discharge.  Musculoskeletal:        Left leg pain.  Neurological:  Negative for dizziness, weakness, light-headedness and headaches.   Physical Exam   Blood pressure 112/71, pulse 83, temperature 98.3 F (36.8 C), temperature source Oral, resp. rate 16, height '4\' 7"'$  (1.397 m), weight 92.5 kg, last menstrual period 12/17/2021, SpO2 99  %.  Patient Vitals for the past 24 hrs:  BP Temp Temp src Pulse Resp SpO2 Height Weight  06/11/22 1302 112/71 98.3 F (36.8 C) Oral 83 16 99 % -- --  06/11/22 1251 -- -- -- -- -- -- '4\' 7"'$  (1.397 m) 92.5 kg   Physical Exam Vitals and nursing note reviewed.  Constitutional:      General: She is not in acute distress.    Appearance: Normal appearance. She is not ill-appearing, toxic-appearing or diaphoretic.  HENT:     Head: Normocephalic and atraumatic.  Pulmonary:     Effort: Pulmonary effort is normal.  Musculoskeletal:     Right upper leg: Normal.     Left upper leg: Normal.     Right lower leg: Normal.     Left lower leg: Normal.     Comments: Calf measurements 43cm, each side, equal.  Skin:    General: Skin is warm and dry.  Neurological:     Mental Status: She is alert and oriented to person, place, and time.  Psychiatric:        Mood and Affect: Mood normal.        Behavior: Behavior normal.        Thought Content: Thought content normal.        Judgment: Judgment normal.    MAU Course  Procedures  MDM -DVT vs. Neuropathy -verbal report from vascular technician shows no DVT and normal arterial blood flow -EFM (Twin A): reactive       -baseline: 130       -variability: moderate       -accels: present. 10x10       -decels: absent       -TOCO: few, irregular ctx -EFM (Twin B): reactive       -baseline: 130       -variability:  moderate       -accels: present, 10x10       -decels: absent       -TOCO: few, irregular ctx -Tylenol '1000mg'$  given per pt request -Capsaicin cream ordered -pt to f/u with OB and neurology -pt discharged to home in stable condition  Assessment and Plan   1. Neuropathy of left lower extremity   2. Monochorionic diamniotic twin gestation in second trimester   3. NST (non-stress test) reactive    Allergies as of 06/11/2022       Reactions   Fish Allergy Anaphylaxis   Other Swelling   Mayonnaise causes swelling of the tongue.    Penicillins Hives   Has patient had a PCN reaction causing immediate rash, facial/tongue/throat swelling, SOB or lightheadedness with hypotension: Yes Has patient had a PCN reaction causing severe rash involving mucus membranes or skin necrosis: No Has patient had a PCN reaction that required hospitalization No Has patient had a PCN reaction occurring within the last 10 years: Yes If all of the above answers are "NO", then may proceed with Cephalosporin use.   Shellfish Allergy Anaphylaxis   Patient is allergic to all seafood. She states she can take the dye that is given in CT scans even though she has some itching    Peanut Butter Flavor Hives   Histamine Other (See Comments)   Patient  tested positive in allergy testing to Control-histamine 1.   Voltaren [diclofenac] Rash, Other (See Comments)   Volatren gel causes blisters.        Medication List     TAKE these medications    Ajovy 225 MG/1.5ML Sosy Generic drug: Fremanezumab-vfrm INJECT 225 MG UNDER THE SKIN EVERY 30 DAYS   cetirizine 10 MG tablet Commonly known as: ZYRTEC Take 20-30 mg by mouth at bedtime.   CLEAR EYES COMPLETE OP Place 1 drop into both eyes 2 (two) times daily as needed (dry eyes/itching).   Doxylamine-Pyridoxine 10-10 MG Tbec doxylamine 10 mg-pyridoxine (vit B6) 10 mg tablet,delayed release  TAKE 2 TABLETS BY MOUTH EVERY DAY   enoxaparin 40 MG/0.4ML injection Commonly known as: LOVENOX enoxaparin 40 mg/0.4 mL subcutaneous syringe  Inject 0.4 mL twice a day by subcutaneous route.   EPINEPHrine 0.3 mg/0.3 mL Soaj injection Commonly known as: EPI-PEN USE AS DIRECTED FOR LIFE THREATENING ALLERGIC REACTIONS   metroNIDAZOLE 0.75 % vaginal gel Commonly known as: METROGEL metronidazole 0.75 % (37.5 mg/5 gram) vaginal gel  INSERT 1 APPLICATORFUL VAGINALLY EVERY DAY AT BEDTIME FOR 5 DAYS   montelukast 10 MG tablet Commonly known as: SINGULAIR TAKE 1 TABLET BY MOUTH EVERY DAY   PRENATAL VITAMINS  PO Take by mouth.   ProAir Digihaler 108 (90 Base) MCG/ACT Aepb Generic drug: Albuterol Sulfate (sensor) Proair Digihaler 90 mcg/actuation aerosol powder breath act, sensor  INHALE 2 PUFFS EVERY 4 HOURS AS NEEDED.   triamcinolone cream 0.5 % Commonly known as: KENALOG Apply 1 application topically every 4 (four) hours as needed (rash/allergies).       -pt to discuss nerve pain with neurologist and MFM for treatment -pt to f/u with neurologist to discuss symptoms -return MAU precautions given -pt discharged to home in stable condition  Elmyra Ricks E Roper Tolson 06/11/2022, 1:24 PM

## 2022-06-11 NOTE — MAU Note (Signed)
Nina Wood is a 36 y.o. at 50w3dhere in MAU reporting: since July 18/19 felt like a pile of bricks was on her left leg. Pain went away with heat. Was advised to come in by Dr DCharlesetta Garibaldiif it gets worse. Now when she is standing her left leg starts to hurt. Was off of lovenox but states did not take it for 2 weeks. Now she is back on it. Does not think she has a clot. +FM x2.   Onset of complaint: ongoing  Pain score: 7/10  Vitals:   06/11/22 1302  BP: 112/71  Pulse: 83  Resp: 16  Temp: 98.3 F (36.8 C)  SpO2: 99%     FHT:EFM applied in room  Lab orders placed from triage: none

## 2022-06-15 ENCOUNTER — Encounter: Payer: Self-pay | Admitting: Hematology and Oncology

## 2022-06-15 ENCOUNTER — Encounter: Payer: Self-pay | Admitting: Internal Medicine

## 2022-06-19 ENCOUNTER — Other Ambulatory Visit: Payer: Self-pay

## 2022-06-19 ENCOUNTER — Inpatient Hospital Stay (HOSPITAL_COMMUNITY): Payer: Managed Care, Other (non HMO)

## 2022-06-19 ENCOUNTER — Encounter (HOSPITAL_COMMUNITY): Payer: Self-pay | Admitting: Obstetrics & Gynecology

## 2022-06-19 ENCOUNTER — Inpatient Hospital Stay (HOSPITAL_BASED_OUTPATIENT_CLINIC_OR_DEPARTMENT_OTHER): Payer: Managed Care, Other (non HMO)

## 2022-06-19 ENCOUNTER — Inpatient Hospital Stay (HOSPITAL_COMMUNITY)
Admission: AD | Admit: 2022-06-19 | Discharge: 2022-06-19 | Disposition: A | Discharge status: Home / Self Care | Payer: Managed Care, Other (non HMO) | Attending: Obstetrics & Gynecology | Admitting: Obstetrics & Gynecology

## 2022-06-19 DIAGNOSIS — R1011 Right upper quadrant pain: Secondary | ICD-10-CM | POA: Diagnosis not present

## 2022-06-19 DIAGNOSIS — O99322 Drug use complicating pregnancy, second trimester: Secondary | ICD-10-CM | POA: Diagnosis not present

## 2022-06-19 DIAGNOSIS — Z87442 Personal history of urinary calculi: Secondary | ICD-10-CM | POA: Insufficient documentation

## 2022-06-19 DIAGNOSIS — O26892 Other specified pregnancy related conditions, second trimester: Secondary | ICD-10-CM | POA: Insufficient documentation

## 2022-06-19 DIAGNOSIS — O99352 Diseases of the nervous system complicating pregnancy, second trimester: Secondary | ICD-10-CM | POA: Insufficient documentation

## 2022-06-19 DIAGNOSIS — O99891 Other specified diseases and conditions complicating pregnancy: Secondary | ICD-10-CM | POA: Diagnosis not present

## 2022-06-19 DIAGNOSIS — O365931 Maternal care for other known or suspected poor fetal growth, third trimester, fetus 1: Secondary | ICD-10-CM

## 2022-06-19 DIAGNOSIS — O99212 Obesity complicating pregnancy, second trimester: Secondary | ICD-10-CM | POA: Diagnosis not present

## 2022-06-19 DIAGNOSIS — K219 Gastro-esophageal reflux disease without esophagitis: Secondary | ICD-10-CM | POA: Diagnosis not present

## 2022-06-19 DIAGNOSIS — O365921 Maternal care for other known or suspected poor fetal growth, second trimester, fetus 1: Secondary | ICD-10-CM

## 2022-06-19 DIAGNOSIS — O99842 Bariatric surgery status complicating pregnancy, second trimester: Secondary | ICD-10-CM | POA: Insufficient documentation

## 2022-06-19 DIAGNOSIS — O4700 False labor before 37 completed weeks of gestation, unspecified trimester: Secondary | ICD-10-CM

## 2022-06-19 DIAGNOSIS — O30032 Twin pregnancy, monochorionic/diamniotic, second trimester: Secondary | ICD-10-CM

## 2022-06-19 DIAGNOSIS — Z86718 Personal history of other venous thrombosis and embolism: Secondary | ICD-10-CM | POA: Diagnosis not present

## 2022-06-19 DIAGNOSIS — R1031 Right lower quadrant pain: Secondary | ICD-10-CM | POA: Diagnosis present

## 2022-06-19 DIAGNOSIS — Z3A27 27 weeks gestation of pregnancy: Secondary | ICD-10-CM

## 2022-06-19 DIAGNOSIS — O402XX2 Polyhydramnios, second trimester, fetus 2: Secondary | ICD-10-CM

## 2022-06-19 DIAGNOSIS — O99512 Diseases of the respiratory system complicating pregnancy, second trimester: Secondary | ICD-10-CM | POA: Diagnosis not present

## 2022-06-19 DIAGNOSIS — R1084 Generalized abdominal pain: Secondary | ICD-10-CM

## 2022-06-19 DIAGNOSIS — R079 Chest pain, unspecified: Secondary | ICD-10-CM | POA: Diagnosis not present

## 2022-06-19 DIAGNOSIS — K589 Irritable bowel syndrome without diarrhea: Secondary | ICD-10-CM | POA: Insufficient documentation

## 2022-06-19 DIAGNOSIS — Z3689 Encounter for other specified antenatal screening: Secondary | ICD-10-CM

## 2022-06-19 DIAGNOSIS — Z9049 Acquired absence of other specified parts of digestive tract: Secondary | ICD-10-CM | POA: Diagnosis not present

## 2022-06-19 DIAGNOSIS — O99612 Diseases of the digestive system complicating pregnancy, second trimester: Secondary | ICD-10-CM | POA: Diagnosis not present

## 2022-06-19 DIAGNOSIS — M797 Fibromyalgia: Secondary | ICD-10-CM | POA: Diagnosis not present

## 2022-06-19 DIAGNOSIS — O09292 Supervision of pregnancy with other poor reproductive or obstetric history, second trimester: Secondary | ICD-10-CM | POA: Diagnosis not present

## 2022-06-19 LAB — CBC WITH DIFFERENTIAL/PLATELET
Abs Immature Granulocytes: 0.05 10*3/uL (ref 0.00–0.07)
Basophils Absolute: 0 10*3/uL (ref 0.0–0.1)
Basophils Relative: 0 %
Eosinophils Absolute: 0.1 10*3/uL (ref 0.0–0.5)
Eosinophils Relative: 1 %
HCT: 33.6 % — ABNORMAL LOW (ref 36.0–46.0)
Hemoglobin: 11.1 g/dL — ABNORMAL LOW (ref 12.0–15.0)
Immature Granulocytes: 0 %
Lymphocytes Relative: 15 %
Lymphs Abs: 2.1 10*3/uL (ref 0.7–4.0)
MCH: 26.3 pg (ref 26.0–34.0)
MCHC: 33 g/dL (ref 30.0–36.0)
MCV: 79.6 fL — ABNORMAL LOW (ref 80.0–100.0)
Monocytes Absolute: 1.2 10*3/uL — ABNORMAL HIGH (ref 0.1–1.0)
Monocytes Relative: 9 %
Neutro Abs: 10.9 10*3/uL — ABNORMAL HIGH (ref 1.7–7.7)
Neutrophils Relative %: 75 %
Platelets: 383 10*3/uL (ref 150–400)
RBC: 4.22 MIL/uL (ref 3.87–5.11)
RDW: 19.3 % — ABNORMAL HIGH (ref 11.5–15.5)
WBC: 14.4 10*3/uL — ABNORMAL HIGH (ref 4.0–10.5)
nRBC: 0 % (ref 0.0–0.2)

## 2022-06-19 LAB — COMPREHENSIVE METABOLIC PANEL
ALT: 17 U/L (ref 0–44)
AST: 19 U/L (ref 15–41)
Albumin: 2.6 g/dL — ABNORMAL LOW (ref 3.5–5.0)
Alkaline Phosphatase: 78 U/L (ref 38–126)
Anion gap: 8 (ref 5–15)
BUN: 12 mg/dL (ref 6–20)
CO2: 21 mmol/L — ABNORMAL LOW (ref 22–32)
Calcium: 8.9 mg/dL (ref 8.9–10.3)
Chloride: 106 mmol/L (ref 98–111)
Creatinine, Ser: 0.51 mg/dL (ref 0.44–1.00)
GFR, Estimated: >60 mL/min (ref >60)
Glucose, Bld: 80 mg/dL (ref 70–99)
Potassium: 4.2 mmol/L (ref 3.5–5.1)
Sodium: 135 mmol/L (ref 135–145)
Total Bilirubin: 0.4 mg/dL (ref 0.3–1.2)
Total Protein: 6.3 g/dL — ABNORMAL LOW (ref 6.5–8.1)

## 2022-06-19 LAB — URINALYSIS, ROUTINE W REFLEX MICROSCOPIC
Bilirubin Urine: NEGATIVE
Glucose, UA: NEGATIVE mg/dL
Hgb urine dipstick: NEGATIVE
Ketones, ur: NEGATIVE mg/dL
Nitrite: NEGATIVE
Protein, ur: NEGATIVE mg/dL
Specific Gravity, Urine: 1.021 (ref 1.005–1.030)
pH: 6 (ref 5.0–8.0)

## 2022-06-19 LAB — WET PREP, GENITAL
Clue Cells Wet Prep HPF POC: NONE SEEN
Sperm: NONE SEEN
Trich, Wet Prep: NONE SEEN
WBC, Wet Prep HPF POC: >=10 — AB (ref <10)
Yeast Wet Prep HPF POC: NONE SEEN

## 2022-06-19 LAB — LIPASE, BLOOD: Lipase: 31 U/L (ref 11–51)

## 2022-06-19 LAB — TROPONIN I (HIGH SENSITIVITY): Troponin I (High Sensitivity): 10 ng/L (ref <18)

## 2022-06-19 LAB — FETAL FIBRONECTIN: Fetal Fibronectin: NEGATIVE

## 2022-06-19 LAB — LACTIC ACID, PLASMA: Lactic Acid, Venous: 1.2 mmol/L (ref 0.5–1.9)

## 2022-06-19 MED ORDER — HYDROMORPHONE HCL 1 MG/ML IJ SOLN
1.0000 mg | Freq: Once | INTRAMUSCULAR | Status: AC
  Administered 2022-06-19: 1 mg via INTRAVENOUS
  Filled 2022-06-19: qty 1

## 2022-06-19 MED ORDER — MORPHINE SULFATE (PF) 4 MG/ML IV SOLN
2.0000 mg | Freq: Once | INTRAVENOUS | Status: AC
  Administered 2022-06-19: 2 mg via INTRAVENOUS
  Filled 2022-06-19: qty 1

## 2022-06-19 MED ORDER — LACTATED RINGERS IV BOLUS
1000.0000 mL | Freq: Once | INTRAVENOUS | Status: AC
Start: 2022-06-19 — End: 2022-06-19
  Administered 2022-06-19: 1000 mL via INTRAVENOUS

## 2022-06-19 MED ORDER — LACTATED RINGERS IV BOLUS
1000.0000 mL | Freq: Once | INTRAVENOUS | Status: AC
  Administered 2022-06-19: 1000 mL via INTRAVENOUS

## 2022-06-19 MED ORDER — OMEPRAZOLE 20 MG PO CPDR
20.0000 mg | DELAYED_RELEASE_CAPSULE | Freq: Every day | ORAL | 6 refills | Status: AC
Start: 2022-06-19 — End: ?

## 2022-06-19 MED ORDER — NIFEDIPINE 10 MG PO CAPS
10.0000 mg | ORAL_CAPSULE | Freq: Once | ORAL | Status: AC
  Administered 2022-06-19: 10 mg via ORAL
  Filled 2022-06-19: qty 1

## 2022-06-19 MED ORDER — LACTATED RINGERS IV BOLUS
250.0000 mL | Freq: Once | INTRAVENOUS | Status: AC
  Administered 2022-06-19: 250 mL via INTRAVENOUS

## 2022-06-19 MED ORDER — FAMOTIDINE IN NACL 20-0.9 MG/50ML-% IV SOLN
20.0000 mg | Freq: Once | INTRAVENOUS | Status: AC
  Administered 2022-06-19: 20 mg via INTRAVENOUS
  Filled 2022-06-19: qty 50

## 2022-06-19 MED ORDER — LORATADINE 10 MG PO TABS
10.0000 mg | ORAL_TABLET | Freq: Every day | ORAL | Status: DC
  Administered 2022-06-19: 10 mg via ORAL
  Filled 2022-06-19: qty 1

## 2022-06-19 NOTE — MAU Note (Signed)
Pt is in MRI  

## 2022-06-19 NOTE — MAU Note (Signed)
Nina Wood is a 36 y.o. at 4w4dhere in MAU reporting: intermittent chest pain located in the center of her chest that occurs with activity.  States pain feels like tightening & burning sensation during inspiration.  Also c/o intermittent pain in mid right abdomen.  Reports pain is sharp & sensitive to touch.  Denies VB or LOF.  Endorses +FM x2  Onset of complaint: last night Pain score: 8/10 chest & 10/10 abdomen There were no vitals filed for this visit.   FHT: deferred  Lab orders placed from triage:   UA

## 2022-06-19 NOTE — MAU Note (Signed)
Pt is back from MRI.

## 2022-06-19 NOTE — MAU Provider Note (Signed)
History    CSN: [id number]  Arrival date and time: 06/19/22 1459  Chief Complaint  Patient presents with   Abdominal Pain   Chest Pain   HPI This is a 36 year old G11 P2082 at 27 weeks and 4 days with a pregnancy that is complicated with mono Di twins with fetal growth restriction of twin A with increased umbilical artery Dopplers, IBS, history of DVT, fibromyalgia.  She presents with right lower abdominal pain that radiates up to her abdomen and into the chest.  The pain is coming and going and severe when it recurs.  The pain initially started on Friday, but has been increasing since that time.  When she has the pain, it lasts for a minute and a half or so.  The pain has been coming about every 5 to 6 minutes.  No abnormal discharge.  No nausea, vomiting, fevers, chills.  She has a remote history of kidney stones but has not had any recently.  No diarrhea or severe constipation. She has a history of an appendectomy and a Roux-en-y gastric bypass.  She is followed by Dr. Normand Sloop as a general obstetrician, but sees [facility].    OB History     Gravida  11   Para  2   Term  2   Preterm  0   AB  8   Living  2      SAB  6   IAB  1   Ectopic  1   Multiple  0   Live Births  2          Past Medical History:  Diagnosis Date   Allergic rhinitis    Allergy to cats    Allergy to dogs    Anemia    Anxiety    Asthma    BMI 37.0-37.9, adult    Brain tumor (benign) (HCC)    Breast cyst    Chronic pain syndrome    Deep vein thrombosis (DVT) (HCC)    DVT (deep venous thrombosis) (HCC)    Endometriosis, uterus    Environmental allergies    Fibromyalgia syndrome    Generalized osteoarthrosis    GERD (gastroesophageal reflux disease)    Headache    Hip discomfort    IBS (irritable bowel syndrome)    with diarrhea   Obesity    Ovarian cyst    Paroxysmal dyspnea    PTSD (post-traumatic stress disorder)    Patient reported   Uterine fibroid    Past Surgical History:   Procedure Laterality Date   APPENDECTOMY  2003   CESAREAN SECTION     DILATION AND CURETTAGE OF UTERUS     1 WEEK AGO   DILATION AND EVACUATION Bilateral 05/18/2016   Procedure: DILATATION AND EVACUATION;  Surgeon: Carrington Clamp, MD;  Location: [facility];  Service: Gynecology;  Laterality: Bilateral;   ECTOPIC PREGNANCY SURGERY     etopi     GASTRIC BYPASS  03/14/2012   INDUCED ABORTION     MYOMECTOMY  06/23/2020   OTHER SURGICAL HISTORY     surgical removal of nexplanon   OVARIAN CYST SURGERY     Family History  Problem Relation Age of Onset   Hypertension Mother    Breast cancer Mother    Prostate cancer Father    Hypertension Maternal Uncle    Diabetes Paternal Aunt    Cancer Paternal Aunt    Cancer Maternal Grandmother    Diabetes Sister    Neuropathy Neg Hx  none pt is aware of    Social History   Tobacco Use   Smoking status: Never   Smokeless tobacco: Never  Vaping Use   Vaping Use: Never used  Substance Use Topics   Alcohol use: Not Currently   Drug use: Not Currently    Types: Marijuana    Comment: 09/23/20-states "about a week ago"   Allergies:  Allergies  Allergen Reactions   Fish Allergy Anaphylaxis   Other Swelling    Mayonnaise causes swelling of the tongue.   Penicillins Hives    Has patient had a PCN reaction causing immediate rash, facial/tongue/throat swelling, SOB or lightheadedness with hypotension: Yes Has patient had a PCN reaction causing severe rash involving mucus membranes or skin necrosis: No Has patient had a PCN reaction that required hospitalization No Has patient had a PCN reaction occurring within the last 10 years: Yes If all of the above answers are "NO", then may proceed with Cephalosporin use.     Shellfish Allergy Anaphylaxis    Patient is allergic to all seafood.  She states she can take the dye that is given in CT scans even though she has some itching    Peanut Butter Flavor Hives   Histamine Other (See  Comments)    Patient  tested positive in allergy testing to Control-histamine 1.   Voltaren [Diclofenac] Rash and Other (See Comments)    Volatren gel causes blisters.   Medications Prior to Admission  Medication Sig Dispense Refill Last Dose   enoxaparin (LOVENOX) 40 MG/0.4ML injection enoxaparin 40 mg/0.4 mL subcutaneous syringe  Inject 0.4 mL twice a day by subcutaneous route.   06/18/2022   Albuterol Sulfate, sensor, (PROAIR DIGIHALER) 108 (90 Base) MCG/ACT AEPB Proair Digihaler 90 mcg/actuation aerosol powder breath act, sensor  INHALE 2 PUFFS EVERY 4 HOURS AS NEEDED.      cetirizine (ZYRTEC) 10 MG tablet Take 20-30 mg by mouth at bedtime.       Doxylamine-Pyridoxine 10-10 MG TBEC doxylamine 10 mg-pyridoxine (vit B6) 10 mg tablet,delayed release  TAKE 2 TABLETS BY MOUTH EVERY DAY      EPINEPHRINE 0.3 mg/0.3 mL IJ SOAJ injection USE AS DIRECTED FOR LIFE THREATENING ALLERGIC REACTIONS 4 each 0    Fremanezumab-vfrm (AJOVY) 225 MG/1.5ML SOSY INJECT 225 MG UNDER THE SKIN EVERY 30 DAYS 1.5 mL 11    Hyprom-Naphaz-Polysorb-Zn Sulf (CLEAR EYES COMPLETE OP) Place 1 drop into both eyes 2 (two) times daily as needed (dry eyes/itching).       metroNIDAZOLE (METROGEL) 0.75 % vaginal gel metronidazole 0.75 % (37.5 mg/5 gram) vaginal gel  INSERT 1 APPLICATORFUL VAGINALLY EVERY DAY AT BEDTIME FOR 5 DAYS (Patient not taking: Reported on 04/06/2022)      montelukast (SINGULAIR) 10 MG tablet TAKE 1 TABLET BY MOUTH EVERY DAY 30 tablet 11    Prenatal Vit-Fe Fumarate-FA (PRENATAL VITAMINS PO) Take by mouth.      triamcinolone cream (KENALOG) 0.5 % Apply 1 application topically every 4 (four) hours as needed (rash/allergies).       Review of Systems Pertinent items noted in HPI and remainder of comprehensive ROS otherwise negative.   Physical Exam   Blood pressure 121/71, pulse 82, temperature 98.3 F (36.8 C), temperature source Oral, resp. rate 18, height 4' 7.5" (1.41 m), weight 92.4 kg, last menstrual  period 12/17/2021, SpO2 97 %.  Patient Vitals for the past 24 hrs:  BP Temp Temp src Pulse Resp SpO2 Height Weight  06/19/22 1735 121/71 -- -- 82 -- -- -- --  06/19/22 1539 119/77 98.3 F (36.8 C) Oral 94 18 97 % -- --  06/19/22 1530 -- -- -- -- -- -- 4' 7.5" (1.41 m) 92.4 kg   Physical Exam Vitals and nursing note reviewed.  Constitutional:      Appearance: She is well-developed.  HENT:     Head: Normocephalic and atraumatic.  Cardiovascular:     Rate and Rhythm: Normal rate and regular rhythm.  Pulmonary:     Effort: Pulmonary effort is normal.     Breath sounds: Normal breath sounds.  Abdominal:     Palpations: Abdomen is soft.     Tenderness: There is abdominal tenderness in the right upper quadrant and right lower quadrant. There is no guarding or rebound.  Skin:    General: Skin is warm and dry.     Capillary Refill: Capillary refill takes less than 2 seconds.  Neurological:     General: No focal deficit present.     Mental Status: She is alert and oriented to person, place, and time.   NST: Baby A Baseline: 120s  Variability: moderate Accelerations: none  Decelerations: none Contractions: every 3-4 minutes  NST: baby B Baseline: 120s  Variability: moderate Accelerations: none  Decelerations: none Contractions: every 3-4 minutes  Dilation: Closed Effacement (%): Thick Exam by:: dr Adrian Blackwater   Results for orders placed or performed during the hospital encounter of 06/19/22 (from the past 24 hour(s))  Urinalysis, Routine w reflex microscopic Urine, Clean Catch     Status: Abnormal   Collection Time: 06/19/22  4:03 PM  Result Value Ref Range   Color, Urine YELLOW YELLOW   APPearance HAZY (A) CLEAR   Specific Gravity, Urine 1.021 1.005 - 1.030   pH 6.0 5.0 - 8.0   Glucose, UA NEGATIVE NEGATIVE mg/dL   Hgb urine dipstick NEGATIVE NEGATIVE   Bilirubin Urine NEGATIVE NEGATIVE   Ketones, ur NEGATIVE NEGATIVE mg/dL   Protein, ur NEGATIVE NEGATIVE mg/dL    Nitrite NEGATIVE NEGATIVE   Leukocytes,Ua LARGE (A) NEGATIVE   RBC / HPF 0-5 0 - 5 RBC/hpf   WBC, UA 11-20 0 - 5 WBC/hpf   Bacteria, UA FEW (A) NONE SEEN   Squamous Epithelial / LPF 6-10 0 - 5   Mucus PRESENT   CBC with Differential/Platelet     Status: Abnormal   Collection Time: 06/19/22  4:51 PM  Result Value Ref Range   WBC 14.4 (H) 4.0 - 10.5 K/uL   RBC 4.22 3.87 - 5.11 MIL/uL   Hemoglobin 11.1 (L) 12.0 - 15.0 g/dL   HCT 40.9 (L) 81.1 - 91.4 %   MCV 79.6 (L) 80.0 - 100.0 fL   MCH 26.3 26.0 - 34.0 pg   MCHC 33.0 30.0 - 36.0 g/dL   RDW 78.2 (H) 95.6 - 21.3 %   Platelets 383 150 - 400 K/uL   nRBC 0.0 0.0 - 0.2 %   Neutrophils Relative % 75 %   Neutro Abs 10.9 (H) 1.7 - 7.7 K/uL   Lymphocytes Relative 15 %   Lymphs Abs 2.1 0.7 - 4.0 K/uL   Monocytes Relative 9 %   Monocytes Absolute 1.2 (H) 0.1 - 1.0 K/uL   Eosinophils Relative 1 %   Eosinophils Absolute 0.1 0.0 - 0.5 K/uL   Basophils Relative 0 %   Basophils Absolute 0.0 0.0 - 0.1 K/uL   Immature Granulocytes 0 %   Abs Immature Granulocytes 0.05 0.00 - 0.07 K/uL  Comprehensive metabolic panel     Status: Abnormal  Collection Time: 06/19/22  4:51 PM  Result Value Ref Range   Sodium 135 135 - 145 mmol/L   Potassium 4.2 3.5 - 5.1 mmol/L   Chloride 106 98 - 111 mmol/L   CO2 21 (L) 22 - 32 mmol/L   Glucose, Bld 80 70 - 99 mg/dL   BUN 12 6 - 20 mg/dL   Creatinine, Ser 0.98 0.44 - 1.00 mg/dL   Calcium 8.9 8.9 - 11.9 mg/dL   Total Protein 6.3 (L) 6.5 - 8.1 g/dL   Albumin 2.6 (L) 3.5 - 5.0 g/dL   AST 19 15 - 41 U/L   ALT 17 0 - 44 U/L   Alkaline Phosphatase 78 38 - 126 U/L   Total Bilirubin 0.4 0.3 - 1.2 mg/dL   GFR, Estimated >14 >78 mL/min   Anion gap 8 5 - 15  Lipase, blood     Status: None   Collection Time: 06/19/22  4:51 PM  Result Value Ref Range   Lipase 31 11 - 51 U/L  Troponin I (High Sensitivity)     Status: None   Collection Time: 06/19/22  4:51 PM  Result Value Ref Range   Troponin I (High Sensitivity)  10 <18 ng/L  Wet prep, genital     Status: Abnormal   Collection Time: 06/19/22  4:59 PM  Result Value Ref Range   Yeast Wet Prep HPF POC NONE SEEN NONE SEEN   Trich, Wet Prep NONE SEEN NONE SEEN   Clue Cells Wet Prep HPF POC NONE SEEN NONE SEEN   WBC, Wet Prep HPF POC >=10 (A) <10   Sperm NONE SEEN   Fetal fibronectin     Status: None   Collection Time: 06/19/22  4:59 PM  Result Value Ref Range   Fetal Fibronectin NEGATIVE NEGATIVE  Lactic acid, plasma     Status: None   Collection Time: 06/19/22  6:41 PM  Result Value Ref Range   Lactic Acid, Venous 1.2 0.5 - 1.9 mmol/L   No results found.  MAU Course  MDM Labs drawn. Will start IV and give dilaudid 1mg .  Patient contracting - checked cervix: see above  FFN neg. [place] complete - relatively unchanged from 8/1.  Has two fibroids, but normal in appearance and not where the abdominal pain is. Contractions spaced out some.  1830 Labs all returned. Has slightly elevated WBC. She does have h/o Roux-en-y bypass. Will get MRI to r/o internal hernia. Check lactic acid.  Patient turned over to Edd Arbour, CNM.  Levie Heritage, DO 06/19/2022 8:17 PM  Assumed care of patient, introduced myself and reviewed plan of care to this point. Dilaudid worn off, pain back to 7/10 with contractions. Of note, epigastric and chest pain only with contractions. Pt does not feel the procardia helped but the dilaudid did. Offered pepcid and morphine, pt accepted.  MRI resulted showing no acute process or hernia of any kind. Discussed results with the patient who strongly desires to go home, has follow up with [facility] MFM tomorrow morning. Pain reduced with morphine and pepcid to 4/10, offered omeprazole in case this pain is related to pregnancy related reflux, pt accepted.  Assessment and Plan  Reflux in pregnancy Mono/di twin pregnancy NST reactive [redacted] weeks gestation of pregnancy  Discharge home in stable condition with strong return  precautions Follow up at [facility] as scheduled for ongoing prenatal care Allergies as of 06/19/2022       Reactions   Fish Allergy Anaphylaxis  Other Swelling   Mayonnaise causes swelling of the tongue.   Penicillins Hives   Has patient had a PCN reaction causing immediate rash, facial/tongue/throat swelling, SOB or lightheadedness with hypotension: Yes Has patient had a PCN reaction causing severe rash involving mucus membranes or skin necrosis: No Has patient had a PCN reaction that required hospitalization No Has patient had a PCN reaction occurring within the last 10 years: Yes If all of the above answers are "NO", then may proceed with Cephalosporin use.   Shellfish Allergy Anaphylaxis   Patient is allergic to all seafood. She states she can take the dye that is given in CT scans even though she has some itching    Peanut Butter Flavor Hives   Histamine Other (See Comments)   Patient  tested positive in allergy testing to Control-histamine 1.   Voltaren [diclofenac] Rash, Other (See Comments)   Volatren gel causes blisters.        Medication List     TAKE these medications    Ajovy 225 MG/1.5ML Sosy Generic drug: Fremanezumab-vfrm INJECT 225 MG UNDER THE SKIN EVERY 30 DAYS   cetirizine 10 MG tablet Commonly known as: ZYRTEC Take 20-30 mg by mouth at bedtime.   CLEAR EYES COMPLETE OP Place 1 drop into both eyes 2 (two) times daily as needed (dry eyes/itching).   Doxylamine-Pyridoxine 10-10 MG Tbec doxylamine 10 mg-pyridoxine (vit B6) 10 mg tablet,delayed release  TAKE 2 TABLETS BY MOUTH EVERY DAY   enoxaparin 40 MG/0.4ML injection Commonly known as: LOVENOX enoxaparin 40 mg/0.4 mL subcutaneous syringe  Inject 0.4 mL twice a day by subcutaneous route.   EPINEPHrine 0.3 mg/0.3 mL Soaj injection Commonly known as: EPI-PEN USE AS DIRECTED FOR LIFE THREATENING ALLERGIC REACTIONS   metroNIDAZOLE 0.75 % vaginal gel Commonly known as:  METROGEL metronidazole 0.75 % (37.5 mg/5 gram) vaginal gel  INSERT 1 APPLICATORFUL VAGINALLY EVERY DAY AT BEDTIME FOR 5 DAYS   montelukast 10 MG tablet Commonly known as: SINGULAIR TAKE 1 TABLET BY MOUTH EVERY DAY   omeprazole 20 MG capsule Commonly known as: PRILOSEC Take 1 capsule (20 mg total) by mouth daily.   PRENATAL VITAMINS PO Take by mouth.   ProAir Digihaler 108 (90 Base) MCG/ACT Aepb Generic drug: Albuterol Sulfate (sensor) Proair Digihaler 90 mcg/actuation aerosol powder breath act, sensor  INHALE 2 PUFFS EVERY 4 HOURS AS NEEDED.   triamcinolone cream 0.5 % Commonly known as: KENALOG Apply 1 application topically every 4 (four) hours as needed (rash/allergies).        Edd Arbour, CNM, MSN, IBCLC Certified Nurse Midwife, [facility]

## 2022-06-20 LAB — GC/CHLAMYDIA PROBE AMP (~~LOC~~) NOT AT ARMC
Chlamydia: NEGATIVE
Comment: NEGATIVE
Comment: NORMAL
Neisseria Gonorrhea: NEGATIVE

## 2022-07-04 ENCOUNTER — Other Ambulatory Visit: Payer: Self-pay | Admitting: Obstetrics & Gynecology

## 2022-07-05 ENCOUNTER — Inpatient Hospital Stay: Payer: Managed Care, Other (non HMO) | Attending: Physician Assistant

## 2022-07-05 ENCOUNTER — Other Ambulatory Visit: Payer: Self-pay | Admitting: Obstetrics & Gynecology

## 2022-07-05 ENCOUNTER — Other Ambulatory Visit: Payer: Self-pay

## 2022-07-05 ENCOUNTER — Encounter: Payer: Self-pay | Admitting: Physician Assistant

## 2022-07-05 ENCOUNTER — Ambulatory Visit (INDEPENDENT_AMBULATORY_CARE_PROVIDER_SITE_OTHER): Payer: Managed Care, Other (non HMO)

## 2022-07-05 ENCOUNTER — Other Ambulatory Visit: Payer: Self-pay | Admitting: Obstetrics and Gynecology

## 2022-07-05 ENCOUNTER — Telehealth: Payer: Self-pay

## 2022-07-05 VITALS — BP 114/54 | HR 86 | Wt 204.9 lb

## 2022-07-05 DIAGNOSIS — Z3A30 30 weeks gestation of pregnancy: Secondary | ICD-10-CM | POA: Insufficient documentation

## 2022-07-05 DIAGNOSIS — D72829 Elevated white blood cell count, unspecified: Secondary | ICD-10-CM | POA: Diagnosis not present

## 2022-07-05 DIAGNOSIS — O4700 False labor before 37 completed weeks of gestation, unspecified trimester: Secondary | ICD-10-CM | POA: Diagnosis not present

## 2022-07-05 DIAGNOSIS — O99013 Anemia complicating pregnancy, third trimester: Secondary | ICD-10-CM | POA: Insufficient documentation

## 2022-07-05 DIAGNOSIS — D509 Iron deficiency anemia, unspecified: Secondary | ICD-10-CM | POA: Diagnosis not present

## 2022-07-05 DIAGNOSIS — O99113 Other diseases of the blood and blood-forming organs and certain disorders involving the immune mechanism complicating pregnancy, third trimester: Secondary | ICD-10-CM | POA: Insufficient documentation

## 2022-07-05 DIAGNOSIS — Z86718 Personal history of other venous thrombosis and embolism: Secondary | ICD-10-CM | POA: Diagnosis not present

## 2022-07-05 LAB — CMP (CANCER CENTER ONLY)
ALT: 12 U/L (ref 0–44)
AST: 12 U/L — ABNORMAL LOW (ref 15–41)
Albumin: 3.4 g/dL — ABNORMAL LOW (ref 3.5–5.0)
Alkaline Phosphatase: 98 U/L (ref 38–126)
Anion gap: 7 (ref 5–15)
BUN: 11 mg/dL (ref 6–20)
CO2: 22 mmol/L (ref 22–32)
Calcium: 9.4 mg/dL (ref 8.9–10.3)
Chloride: 107 mmol/L (ref 98–111)
Creatinine: 0.35 mg/dL — ABNORMAL LOW (ref 0.44–1.00)
GFR, Estimated: >60 mL/min (ref >60)
Glucose, Bld: 107 mg/dL — ABNORMAL HIGH (ref 70–99)
Potassium: 3.6 mmol/L (ref 3.5–5.1)
Sodium: 136 mmol/L (ref 135–145)
Total Bilirubin: 0.3 mg/dL (ref 0.3–1.2)
Total Protein: 6.6 g/dL (ref 6.5–8.1)

## 2022-07-05 LAB — IRON AND IRON BINDING CAPACITY (CC-WL,HP ONLY)
Iron: 10 ug/dL — ABNORMAL LOW (ref 28–170)
Saturation Ratios: 2 % — ABNORMAL LOW (ref 10.4–31.8)
TIBC: 622 ug/dL — ABNORMAL HIGH (ref 250–450)
UIBC: 612 ug/dL — ABNORMAL HIGH (ref 148–442)

## 2022-07-05 LAB — CBC WITH DIFFERENTIAL (CANCER CENTER ONLY)
Abs Immature Granulocytes: 0.1 10*3/uL — ABNORMAL HIGH (ref 0.00–0.07)
Basophils Absolute: 0 10*3/uL (ref 0.0–0.1)
Basophils Relative: 0 %
Eosinophils Absolute: 0 10*3/uL (ref 0.0–0.5)
Eosinophils Relative: 0 %
HCT: 30.4 % — ABNORMAL LOW (ref 36.0–46.0)
Hemoglobin: 10.3 g/dL — ABNORMAL LOW (ref 12.0–15.0)
Immature Granulocytes: 1 %
Lymphocytes Relative: 8 %
Lymphs Abs: 1.6 10*3/uL (ref 0.7–4.0)
MCH: 25.7 pg — ABNORMAL LOW (ref 26.0–34.0)
MCHC: 33.9 g/dL (ref 30.0–36.0)
MCV: 75.8 fL — ABNORMAL LOW (ref 80.0–100.0)
Monocytes Absolute: 1.1 10*3/uL — ABNORMAL HIGH (ref 0.1–1.0)
Monocytes Relative: 6 %
Neutro Abs: 16.7 10*3/uL — ABNORMAL HIGH (ref 1.7–7.7)
Neutrophils Relative %: 85 %
Platelet Count: 433 10*3/uL — ABNORMAL HIGH (ref 150–400)
RBC: 4.01 MIL/uL (ref 3.87–5.11)
RDW: 17.2 % — ABNORMAL HIGH (ref 11.5–15.5)
WBC Count: 19.5 10*3/uL — ABNORMAL HIGH (ref 4.0–10.5)
nRBC: 0 % (ref 0.0–0.2)

## 2022-07-05 LAB — FERRITIN: Ferritin: 4 ng/mL — ABNORMAL LOW (ref 11–307)

## 2022-07-05 LAB — RETIC PANEL
Immature Retic Fract: 38.1 % — ABNORMAL HIGH (ref 2.3–15.9)
RBC.: 3.91 MIL/uL (ref 3.87–5.11)
Retic Count, Absolute: 47.7 10*3/uL (ref 19.0–186.0)
Retic Ct Pct: 1.2 % (ref 0.4–3.1)
Reticulocyte Hemoglobin: 25.7 pg — ABNORMAL LOW (ref >27.9)

## 2022-07-05 NOTE — Progress Notes (Signed)
[facility] patient Nina Wood here for 2nd betamethasone injection. 1st injection given yesterday at [place] office. Injection administered without complication. Patient will follow up with [facility] for continued care.  Annabell Howells, RN 07/05/2022  11:16 AM

## 2022-07-05 NOTE — Telephone Encounter (Addendum)
-----   Message from Deloris Ping, [place] sent at 07/04/2022  7:48 PM EDT ----- Regarding: BMZ Injection Hi,   This patient is a [facility] patient who needs a 2nd dose of BMZ. She was given the first dose at MFM in [place] today 07/04/22 at 11:00am today. Dr. Alesia Richards is is placing orders for this patient to receive the second dose at [facility] on 07/05/22 @ 11am.   Thank you!   -Shay, CNM   ---------------------------------------------------------------------------------  Called pt to confirm 11 AM appt this morning.

## 2022-07-06 MED ORDER — BETAMETHASONE SOD PHOS & ACET 6 (3-3) MG/ML IJ SUSP
12.0000 mg | Freq: Once | INTRAMUSCULAR | Status: AC
  Administered 2022-07-05: 12 mg via INTRAMUSCULAR

## 2022-07-07 ENCOUNTER — Inpatient Hospital Stay: Payer: Managed Care, Other (non HMO)

## 2022-07-07 ENCOUNTER — Inpatient Hospital Stay (HOSPITAL_BASED_OUTPATIENT_CLINIC_OR_DEPARTMENT_OTHER): Payer: Managed Care, Other (non HMO) | Admitting: Physician Assistant

## 2022-07-07 DIAGNOSIS — Z8042 Family history of malignant neoplasm of prostate: Secondary | ICD-10-CM

## 2022-07-07 DIAGNOSIS — O99013 Anemia complicating pregnancy, third trimester: Secondary | ICD-10-CM

## 2022-07-07 DIAGNOSIS — D509 Iron deficiency anemia, unspecified: Secondary | ICD-10-CM

## 2022-07-07 DIAGNOSIS — D500 Iron deficiency anemia secondary to blood loss (chronic): Secondary | ICD-10-CM

## 2022-07-07 DIAGNOSIS — Z803 Family history of malignant neoplasm of breast: Secondary | ICD-10-CM

## 2022-07-07 DIAGNOSIS — I82402 Acute embolism and thrombosis of unspecified deep veins of left lower extremity: Secondary | ICD-10-CM | POA: Diagnosis not present

## 2022-07-07 DIAGNOSIS — D72829 Elevated white blood cell count, unspecified: Secondary | ICD-10-CM | POA: Diagnosis not present

## 2022-07-07 DIAGNOSIS — Z3A30 30 weeks gestation of pregnancy: Secondary | ICD-10-CM

## 2022-07-08 NOTE — Progress Notes (Signed)
[place] [phone]   [phone]  PROGRESS NOTE  Patient Care Team: Nina Nakai, MD as PCP - General (Internal Medicine) Nina Givens, MD as PCP - OBGYN (Obstetrics and Gynecology)  I connected with Nina Wood  on 07/07/2022 by telephone visit and verified that I am speaking with the correct person using two identifiers.   I discussed the limitations, risks, security and privacy concerns of performing an evaluation and management service by telemedicine and the availability of in-person appointments. I also discussed with the patient that there may be a patient responsible charge related to this service. The patient expressed understanding and agreed to proceed.  Patient's location: Home Provider's location: Office   Hematological/Oncological History # Iron Deficiency Anemia in Pregnancy # Provoked Left Lower Extremity DVT, in setting of estrogen containing OCPs.  1) 03/08/2022: establish care with Nina Wood  2) 03/24/2022-03/31/2022: Received IV Feraheme 510 mg x 2 doses  CHIEF COMPLAINTS/PURPOSE OF CONSULTATION:  "Iron Deficiency Anemia  "  HISTORY OF PRESENTING ILLNESS:  Nina Wood 36 y.o. female for a follow up for iron deficiency in pregnancy. Nina Wood is [redacted] weeks pregnant and report progressive fatigue. She is has to rest frequently. She reports shortness of breath mainly with exertion but also now at rest. She denies nausea, vomiting, diarrhea or constipation. She is has noticed some gum bleeding with lovenox injections but otherwise no other signs of bleeding. She reports intermittent episodes of low grade fevers which has been present for some time during her pregnancy. She has no other complaints.  Full 10 point ROS is listed below.  MEDICAL HISTORY:  Past Medical History:  Diagnosis Date   Allergic rhinitis    Allergy to cats    Allergy to dogs    Anemia    Anxiety    Asthma    BMI 37.0-37.9, adult    Brain tumor (benign)  (HCC)    Breast cyst    Chronic pain syndrome    Deep vein thrombosis (DVT) (HCC)    DVT (deep venous thrombosis) (HCC)    Endometriosis, uterus    Environmental allergies    Fibromyalgia syndrome    Generalized osteoarthrosis    GERD (gastroesophageal reflux disease)    Headache    Hip discomfort    IBS (irritable bowel syndrome)    with diarrhea   Obesity    Ovarian cyst    Paroxysmal dyspnea    PTSD (post-traumatic stress disorder)    Patient reported   Uterine fibroid     SURGICAL HISTORY: Past Surgical History:  Procedure Laterality Date   APPENDECTOMY  2003   CESAREAN SECTION     DILATION AND CURETTAGE OF UTERUS     1 WEEK AGO   DILATION AND EVACUATION Bilateral 05/18/2016   Procedure: DILATATION AND EVACUATION;  Surgeon: Nina Charleston, MD;  Location: [facility];  Service: Gynecology;  Laterality: Bilateral;   ECTOPIC PREGNANCY SURGERY     etopi     GASTRIC BYPASS  03/14/2012   INDUCED ABORTION     MYOMECTOMY  06/23/2020   OTHER SURGICAL HISTORY     surgical removal of nexplanon   OVARIAN CYST SURGERY      SOCIAL HISTORY: Social History   Socioeconomic History   Marital status: Single    Spouse name: Not on file   Number of children: 3   Years of education: Not on file   Highest education level: Bachelor's degree (e.g., BA, AB, BS)  Occupational  History   Not on file  Tobacco Use   Smoking status: Never   Smokeless tobacco: Never  Vaping Use   Vaping Use: Never used  Substance and Sexual Activity   Alcohol use: Not Currently   Drug use: Not Currently    Types: Marijuana    Comment: 09/23/20-states "about a week ago"   Sexual activity: Yes  Other Topics Concern   Not on file  Social History Narrative   Lives at home with her children   Right handed   Caffeine:  At least 4 venti size coffees per day, doesn't drink tea much, drinks 48 oz water daily   Social Determinants of Health   Financial Resource Strain: Not on file  Food Insecurity: Not  on file  Transportation Needs: Not on file  Physical Activity: Not on file  Stress: Not on file  Social Connections: Not on file  Intimate Partner Violence: Not on file    FAMILY HISTORY: Family History  Problem Relation Age of Onset   Hypertension Mother    Breast cancer Mother    Prostate cancer Father    Hypertension Maternal Uncle    Diabetes Paternal Aunt    Cancer Paternal Aunt    Cancer Maternal Grandmother    Diabetes Sister    Neuropathy Neg Hx        none pt is aware of    ALLERGIES:  is allergic to fish allergy, other, penicillins, shellfish allergy, peanut butter flavor, histamine, and voltaren [diclofenac].  MEDICATIONS:  Current Outpatient Medications  Medication Sig Dispense Refill   Albuterol Sulfate, sensor, (PROAIR DIGIHALER) 108 (90 Base) MCG/ACT AEPB Proair Digihaler 90 mcg/actuation aerosol powder breath act, sensor  INHALE 2 PUFFS EVERY 4 HOURS AS NEEDED.     cetirizine (ZYRTEC) 10 MG tablet Take 20-30 mg by mouth at bedtime.      Doxylamine-Pyridoxine 10-10 MG TBEC doxylamine 10 mg-pyridoxine (vit B6) 10 mg tablet,delayed release  TAKE 2 TABLETS BY MOUTH EVERY DAY     enoxaparin (LOVENOX) 40 MG/0.4ML injection enoxaparin 40 mg/0.4 mL subcutaneous syringe  Inject 0.4 mL twice a day by subcutaneous route.     EPINEPHRINE 0.3 mg/0.3 mL IJ SOAJ injection USE AS DIRECTED FOR LIFE THREATENING ALLERGIC REACTIONS 4 each 0   Fremanezumab-vfrm (AJOVY) 225 MG/1.5ML SOSY INJECT 225 MG UNDER THE SKIN EVERY 30 DAYS 1.5 mL 11   Hyprom-Naphaz-Polysorb-Zn Sulf (CLEAR EYES COMPLETE OP) Place 1 drop into both eyes 2 (two) times daily as needed (dry eyes/itching).      metroNIDAZOLE (METROGEL) 0.75 % vaginal gel metronidazole 0.75 % (37.5 mg/5 gram) vaginal gel  INSERT 1 APPLICATORFUL VAGINALLY EVERY DAY AT BEDTIME FOR 5 DAYS (Patient not taking: Reported on 04/06/2022)     montelukast (SINGULAIR) 10 MG tablet TAKE 1 TABLET BY MOUTH EVERY DAY 30 tablet 11   omeprazole  (PRILOSEC) 20 MG capsule Take 1 capsule (20 mg total) by mouth daily. 30 capsule 6   Prenatal Vit-Fe Fumarate-FA (PRENATAL VITAMINS PO) Take by mouth.     triamcinolone cream (KENALOG) 0.5 % Apply 1 application topically every 4 (four) hours as needed (rash/allergies).      No current facility-administered medications for this visit.    REVIEW OF SYSTEMS:   Constitutional: ( + ) fevers, ( - )  chills , ( - ) night sweats Eyes: ( - ) blurriness of vision, ( - ) double vision, ( - ) watery eyes Ears, nose, mouth, throat, and face: ( - ) mucositis, ( - )  sore throat Respiratory: ( - ) cough, ( + ) dyspnea, ( - ) wheezes Cardiovascular: ( - ) palpitation, ( - ) chest discomfort, ( - ) lower extremity swelling Gastrointestinal:  ( - ) nausea, ( - ) heartburn, ( - ) change in bowel habits Skin: ( - ) abnormal skin rashes Lymphatics: ( - ) new lymphadenopathy, ( - ) easy bruising Neurological: ( - ) numbness, ( - ) tingling, ( - ) new weaknesses Behavioral/Psych: ( - ) mood change, ( - ) new changes  All other systems were reviewed with the patient and are negative.  PHYSICAL EXAMINATION: Not performed due to virtual visit.   LABORATORY DATA:  I have reviewed the data as listed    Latest Ref Rng & Units 07/05/2022   12:03 PM 06/19/2022    4:51 PM 03/08/2022   10:34 AM  CBC  WBC 4.0 - 10.5 K/uL 19.5  14.4  8.6   Hemoglobin 12.0 - 15.0 g/dL 10.3  11.1  8.6   Hematocrit 36.0 - 46.0 % 30.4  33.6  28.8   Platelets 150 - 400 K/uL 433  383  518        Latest Ref Rng & Units 07/05/2022   12:03 PM 06/19/2022    4:51 PM 03/08/2022   10:34 AM  CMP  Glucose 70 - 99 mg/dL 107  80  90   BUN 6 - 20 mg/dL '11  12  12   '$ Creatinine 0.44 - 1.00 mg/dL 0.35  0.51  0.48   Sodium 135 - 145 mmol/L 136  135  134   Potassium 3.5 - 5.1 mmol/L 3.6  4.2  3.8   Chloride 98 - 111 mmol/L 107  106  106   CO2 22 - 32 mmol/L '22  21  21   '$ Calcium 8.9 - 10.3 mg/dL 9.4  8.9  9.8   Total Protein 6.5 - 8.1 g/dL 6.6  6.3   7.4   Total Bilirubin 0.3 - 1.2 mg/dL 0.3  0.4  0.3   Alkaline Phos 38 - 126 U/L 98  78  41   AST 15 - 41 U/L '12  19  14   '$ ALT 0 - 44 U/L '12  17  7      '$ ASSESSMENT & PLAN Nina Wood 36 y.o. female with medical history significant for asthma, allergies, GERD, fibromyalgia, who presents for evaluation of DVT prophylaxis in pregnancy and iron deficiency anemia.  After review of the labs, review of the records, and discussion with the patient the patients findings are most consistent with chronic iron deficiency anemia now complicated by pregnancy and DVT provoked in the setting of OCP use.  # Iron Deficiency Anemia in Pregnancy --Received 2 doses of IV Feraheme 510 mg from 03/24/2022 and 5/19/223 --Labs from 07/05/2022 show anemia with Hgb 10.3, serum iron 10, TIBc 622, saturation 2%, ferritin 4. --Recommend another round of IV Feraheme 510 mg starting next week.  --RTC 4-6 weeks after delivery as patient plans to get admitted on 07/20/2022 and plan to likely delivery between 30-34 weeks.   # DVT of LLE in setting  -- Currently on prophylaxis dose enoxaparin 40 mg daily subq -- As patient has reached her third-trimester, would recommend consideration of twice daily dosing at 40 mg --DVT appears to be provoked in the setting of estrogen-based OCPs.  In that case would recommend continuation of blood thinner for at least 6 weeks after delivery --No need for long-term anticoagulation after  delivery and completion of 6 weeks after delivery --Continue to monitor  #Leukocytosis: --WBC is 19.5, ANC 16.7.  --Discussed with OB, Dr. Leward Quan, who said likely cause is steroid induced.  --Monitor fevers and follow up if temperature is greater than 100.   No orders of the defined types were placed in this encounter.   All questions were answered. The patient knows to call the clinic with any problems, questions or concerns.  I have spent a total of 30 minutes minutes of face-to-face and  non-face-to-face time, preparing to see the patient, performing a medically appropriate examination, counseling and educating the patient, ordering medications/tests, documenting clinical information in the electronic health record, and care coordination.    Dede Query PA-C Dept of Hematology and [place] at [facility] Phone: [phone]   07/08/2022 2:32 PM

## 2022-07-10 ENCOUNTER — Telehealth: Payer: Self-pay | Admitting: Physician Assistant

## 2022-07-10 NOTE — Telephone Encounter (Signed)
Unable to leave message with follow-up appointment per 8/25 los. Mailed calendar.

## 2022-07-11 ENCOUNTER — Encounter (HOSPITAL_COMMUNITY): Payer: Self-pay

## 2022-07-12 ENCOUNTER — Ambulatory Visit (HOSPITAL_COMMUNITY)
Admission: RE | Admit: 2022-07-12 | Discharge: 2022-07-12 | Disposition: A | Discharge status: Home / Self Care | Payer: Managed Care, Other (non HMO) | Source: Ambulatory Visit | Attending: Physician Assistant | Admitting: Physician Assistant

## 2022-07-12 DIAGNOSIS — D500 Iron deficiency anemia secondary to blood loss (chronic): Secondary | ICD-10-CM | POA: Diagnosis present

## 2022-07-12 MED ORDER — SODIUM CHLORIDE 0.9 % IV SOLN
510.0000 mg | INTRAVENOUS | Status: DC
  Administered 2022-07-12: 510 mg via INTRAVENOUS
  Filled 2022-07-12: qty 510

## 2022-07-12 NOTE — Consult Note (Signed)
Neonatology Consult to Antenatal Patient:  I was asked by patient to see her during her IV infusion today in order to provide antenatal counseling due to complex pregnancy  Nina Wood is pregnancy with 30 6/7 weeks mono-di twins with significant weight discrepancy.  She is being followed by [facility], including a recent [facility] NICU consultation, for suspected sIUGR vs TTTS.  I spoke with the patient and her husband. They made it very clear delivery is planned at [facility].  Her primary question for me was can babies be transferred to [facility] later on once stable and appropriate.  I affirmed yes.  I then spoke about general criteria for dc from NICUs, achievement of developmental maturity for safe dc home.  They expressed understanding (it reinforced what [facility] NICU communicated) and appreciation.   Thank you for asking me to see this patient.  Monia Sabal Katherina Mires, MD Neonatologist  The total length of face-to-face or floor/unit time for this encounter was 25 minutes. Counseling and/or coordination of care was 40 minutes of the above.

## 2022-07-18 ENCOUNTER — Encounter (HOSPITAL_COMMUNITY)
Admission: RE | Admit: 2022-07-18 | Discharge: 2022-07-18 | Disposition: A | Discharge status: Home / Self Care | Payer: Managed Care, Other (non HMO) | Source: Ambulatory Visit | Attending: Obstetrics and Gynecology | Admitting: Obstetrics and Gynecology

## 2022-07-18 HISTORY — DX: Polyneuropathy, unspecified: G62.9

## 2022-07-20 ENCOUNTER — Inpatient Hospital Stay (HOSPITAL_COMMUNITY)
Admission: RE | Admit: 2022-07-20 | Payer: Managed Care, Other (non HMO) | Source: Home / Self Care | Admitting: Obstetrics and Gynecology

## 2022-07-20 ENCOUNTER — Encounter (HOSPITAL_COMMUNITY): Admission: RE | Payer: Self-pay | Source: Home / Self Care

## 2022-07-20 SURGERY — Surgical Case
Anesthesia: Regional

## 2022-07-25 ENCOUNTER — Encounter: Payer: Self-pay | Admitting: Neurology

## 2022-07-25 ENCOUNTER — Encounter: Payer: Self-pay | Admitting: Physician Assistant

## 2022-08-08 ENCOUNTER — Encounter: Payer: Self-pay | Admitting: Internal Medicine

## 2022-08-10 ENCOUNTER — Other Ambulatory Visit: Payer: Self-pay | Admitting: Physician Assistant

## 2022-08-10 ENCOUNTER — Other Ambulatory Visit: Payer: Self-pay | Admitting: Internal Medicine

## 2022-08-10 DIAGNOSIS — D500 Iron deficiency anemia secondary to blood loss (chronic): Secondary | ICD-10-CM

## 2022-08-22 ENCOUNTER — Other Ambulatory Visit: Payer: Self-pay | Admitting: *Deleted

## 2022-08-22 DIAGNOSIS — R9089 Other abnormal findings on diagnostic imaging of central nervous system: Secondary | ICD-10-CM

## 2022-08-23 ENCOUNTER — Inpatient Hospital Stay: Payer: Managed Care, Other (non HMO) | Attending: Physician Assistant

## 2022-08-31 ENCOUNTER — Ambulatory Visit: Payer: BC Managed Care – PPO | Admitting: Internal Medicine

## 2022-09-11 ENCOUNTER — Telehealth (HOSPITAL_COMMUNITY): Payer: Self-pay

## 2022-09-29 ENCOUNTER — Inpatient Hospital Stay: Payer: Managed Care, Other (non HMO)

## 2022-09-29 ENCOUNTER — Inpatient Hospital Stay: Payer: Managed Care, Other (non HMO) | Attending: Physician Assistant | Admitting: Hematology and Oncology

## 2022-10-10 ENCOUNTER — Other Ambulatory Visit: Payer: Self-pay | Admitting: Radiation Therapy

## 2022-10-11 ENCOUNTER — Encounter: Payer: Self-pay | Admitting: Internal Medicine

## 2022-10-12 ENCOUNTER — Ambulatory Visit (HOSPITAL_COMMUNITY)
Admission: RE | Admit: 2022-10-12 | Discharge: 2022-10-12 | Disposition: A | Discharge status: Home / Self Care | Payer: Managed Care, Other (non HMO) | Source: Ambulatory Visit | Attending: Internal Medicine | Admitting: Internal Medicine

## 2022-10-12 DIAGNOSIS — R9089 Other abnormal findings on diagnostic imaging of central nervous system: Secondary | ICD-10-CM | POA: Insufficient documentation

## 2022-10-12 MED ORDER — GADOBUTROL 1 MMOL/ML IV SOLN
9.0000 mL | Freq: Once | INTRAVENOUS | Status: AC | PRN
  Administered 2022-10-12: 9 mL via INTRAVENOUS

## 2022-10-23 ENCOUNTER — Ambulatory Visit: Payer: Managed Care, Other (non HMO) | Admitting: Internal Medicine

## 2022-10-23 ENCOUNTER — Telehealth: Payer: Managed Care, Other (non HMO) | Admitting: Internal Medicine

## 2022-10-23 ENCOUNTER — Inpatient Hospital Stay: Payer: Managed Care, Other (non HMO) | Attending: Physician Assistant

## 2022-10-26 ENCOUNTER — Inpatient Hospital Stay: Payer: Managed Care, Other (non HMO) | Admitting: Internal Medicine

## 2022-10-26 DIAGNOSIS — R9089 Other abnormal findings on diagnostic imaging of central nervous system: Secondary | ICD-10-CM | POA: Diagnosis not present

## 2022-10-26 DIAGNOSIS — M5417 Radiculopathy, lumbosacral region: Secondary | ICD-10-CM | POA: Diagnosis not present

## 2022-10-26 NOTE — Progress Notes (Signed)
I connected with Nina Wood on 10/26/22 at  2:00 PM EST by telephone visit and verified that I am speaking with the correct person using two identifiers.  I discussed the limitations, risks, security and privacy concerns of performing an evaluation and management service by telemedicine and the availability of in-person appointments. I also discussed with the patient that there may be a patient responsible charge related to this service. The patient expressed understanding and agreed to proceed.  Other persons participating in the visit and their role in the encounter:  n/a  Patient's location:  Home Provider's location:  Office Chief Complaint:  Lumbosacral radiculopathy  Abnormal finding on MRI of brain  History of Present Ilness: Nina Wood reports new symptom- pain and numbness running down her legs, particularly on the right.  This has not improved with rest, rehab, oral medications.  Onset was following her high risk pregnancy, cesarean delivery.  Otherwise no new or progressive deficits, no exacerbation of headaches.  Denies seizures.  Observations: Language and cognition at baseline  Imaging:  [facility] Clinician Interpretation: I have personally reviewed the CNS images as listed.  My interpretation, in the context of the patient's clinical presentation, is stable disease  MR Brain W Wo Contrast  Result Date: 10/14/2022 CLINICAL DATA:  Abnormal brain MRI EXAM: MRI HEAD WITHOUT AND WITH CONTRAST TECHNIQUE: Multiplanar, multiecho pulse sequences of the brain and surrounding structures were obtained without and with intravenous contrast. CONTRAST:  16m GADAVIST GADOBUTROL 1 MMOL/ML IV SOLN COMPARISON:  08/25/2021 and before FINDINGS: Brain: No acute infarct, mass effect or extra-axial collection. No acute or chronic hemorrhage. Normal white matter signal, parenchymal volume and CSF spaces. Unchanged 12 mm focus of hyperintense T2-weighted signal in the right parietal white matter. This is  stable since 06/19/2018. There is no abnormal contrast enhancement. Vascular: Major flow voids are preserved. Skull and upper cervical spine: Normal calvarium and skull base. Visualized upper cervical spine and soft tissues are normal. Sinuses/Orbits:No paranasal sinus fluid levels or advanced mucosal thickening. No mastoid or middle ear effusion. Normal orbits. IMPRESSION: Unchanged 12 mm focus of hyperintense T2-weighted signal in the right parietal white matter (as far back as 06/19/2018). Low-grade glioma versus nonspecific gliosis remain the primary considerations. Next follow-up examination in 1 year is reasonable. Electronically Signed   By: KUlyses JarredM.D.   On: 10/14/2022 02:01    Assessment and Plan: Lumbosacral radiculopathy  Abnormal finding on MRI of brain  Follow Up Instructions:  Nina ELLITHORPEis stable from neuro-onc standpoint.  We would recommend re-imaging brain in 18 months, she is agreeable with this.  For back/leg symptoms, this could represent structural lumbosacral radiculopathy.     We will recommend obtaining lumbar spine MRI for further evaluation.  We'll give her a call after the spine study is done.  I discussed the assessment and treatment plan with the patient.  The patient was provided an opportunity to ask questions and all were answered.  The patient agreed with the plan and demonstrated understanding of the instructions.    The patient was advised to call back or seek an in-person evaluation if the symptoms worsen or if the condition fails to improve as anticipated.    [name] MD   I provided 22 minutes of non face-to-face telephone visit time during this encounter, and > 50% was spent counseling as documented under my assessment & plan.

## 2022-11-09 ENCOUNTER — Ambulatory Visit (HOSPITAL_COMMUNITY): Admission: RE | Admit: 2022-11-09 | Payer: Managed Care, Other (non HMO) | Source: Ambulatory Visit

## 2022-11-09 ENCOUNTER — Encounter: Payer: Self-pay | Admitting: Internal Medicine

## 2022-11-10 ENCOUNTER — Other Ambulatory Visit: Payer: Self-pay | Admitting: *Deleted

## 2022-11-12 ENCOUNTER — Ambulatory Visit (HOSPITAL_COMMUNITY): Admission: RE | Admit: 2022-11-12 | Payer: Managed Care, Other (non HMO) | Source: Ambulatory Visit

## 2022-11-14 ENCOUNTER — Inpatient Hospital Stay: Payer: Managed Care, Other (non HMO) | Admitting: Internal Medicine

## 2022-11-14 ENCOUNTER — Encounter: Payer: Self-pay | Admitting: *Deleted

## 2022-11-28 ENCOUNTER — Telehealth: Payer: Self-pay

## 2022-11-28 ENCOUNTER — Other Ambulatory Visit: Payer: Self-pay | Admitting: Internal Medicine

## 2022-11-28 ENCOUNTER — Other Ambulatory Visit: Payer: Self-pay | Admitting: Medical Oncology

## 2022-11-28 DIAGNOSIS — M5417 Radiculopathy, lumbosacral region: Secondary | ICD-10-CM

## 2022-11-28 DIAGNOSIS — R9089 Other abnormal findings on diagnostic imaging of central nervous system: Secondary | ICD-10-CM

## 2022-11-28 NOTE — Telephone Encounter (Signed)
Patient called to request that MRI of the lumbar spine be scheduled from [facility] to [facility], as patient is not able to travel to [place] to have scan done.  Confirmed NPI and fax number with [facility] and Dr. Mickeal Skinner modified the order to reflect new location.  Order requisition signed and faxed sent successfully.  RN called to notify patient of altered order. Patient stated that she had already been scheduled for scan at new location tomorrow, 1/17.  Patient verbalized appreciation for call.

## 2022-11-30 ENCOUNTER — Encounter: Payer: Self-pay | Admitting: *Deleted

## 2022-11-30 NOTE — Progress Notes (Signed)
Received report from [facility] for MRI Lumbar Spine.  Impression: Essentially normal lumbar spine MRI with minimal facet arthropathy at L4-L5 and L5-S1 with no evidence of nerve root impingement or other finding to explain the patient's radiculopathy.  Routed to Dr Mickeal Skinner.

## 2022-12-05 ENCOUNTER — Telehealth: Payer: Self-pay | Admitting: *Deleted

## 2022-12-05 NOTE — Telephone Encounter (Signed)
This nurse working to complete [employer] form for Nina Wood.  Placed in collaborative bin for provider review, signature and return to this nurse.  Message for collaborative to assist with questions of ADL's, limitations and restrictions.

## 2022-12-07 NOTE — Telephone Encounter (Signed)
Paperwork returned and received by this nurse today from collaborative signed by provider.  Successfully returned to [facility] via fax with 10/14/2022 MRI and recent lab results.  Copy mailed to address on file. [address] [id number] Process completed with form to bin designated for items to be scanned.  No further instructions received or activity performed by this nurse.

## 2022-12-25 ENCOUNTER — Telehealth: Payer: Self-pay

## 2022-12-25 NOTE — Telephone Encounter (Signed)
T/C from pt requesting an office/virtual visit today.  She is having increased back pain, bruising and she hit her head on her car trunk and has a dent in her head and having pain in her head.  She is seeing white lines that come and go.  She also wants to discuss her next step in treatment.  Please advise.

## 2022-12-25 NOTE — Telephone Encounter (Signed)
Pt advised and agreed to recommendations.  Referred to scheduling for appt this week for virtual visit

## 2022-12-26 ENCOUNTER — Telehealth: Payer: Self-pay | Admitting: Internal Medicine

## 2022-12-26 NOTE — Telephone Encounter (Signed)
Called patient regarding upcoming appointments, left a voicemail. 

## 2022-12-28 ENCOUNTER — Inpatient Hospital Stay: Payer: Managed Care, Other (non HMO) | Attending: Physician Assistant | Admitting: Internal Medicine

## 2022-12-28 DIAGNOSIS — G5793 Unspecified mononeuropathy of bilateral lower limbs: Secondary | ICD-10-CM

## 2022-12-28 MED ORDER — PREGABALIN 75 MG PO CAPS: 75.0000 mg | ORAL_CAPSULE | Freq: Two times a day (BID) | ORAL | 3 refills | Status: AC

## 2022-12-28 NOTE — Progress Notes (Signed)
I connected with Nina Wood on 12/28/22 at  3:30 PM EST by telephone visit and verified that I am speaking with the correct person using two identifiers.  I discussed the limitations, risks, security and privacy concerns of performing an evaluation and management service by telemedicine and the availability of in-person appointments. I also discussed with the patient that there may be a patient responsible charge related to this service. The patient expressed understanding and agreed to proceed.  Other persons participating in the visit and their role in the encounter:  n/a  Patient's location:  Home Provider's location:  Office Chief Complaint:  No diagnosis found.  History of Present Ilness: Nina Wood reports ongoing pain and numbness running down her legs, particularly on the right.  Not improved from prior.  Onset was again reported following her high risk pregnancy, cesarean delivery.  Otherwise no new or progressive deficits, no exacerbation of headaches.  Denies seizures.  Observations: Language and cognition at baseline  Imaging: MRI Lumbar spine from 12/06/22, [facility] was reviewed, is normal  [facility] Clinician Interpretation: I have personally reviewed the CNS images as listed.  My interpretation, in the context of the patient's clinical presentation, is stable disease  Assessment and Plan: Neuropathy involving both lower extremities  Clinically not improved.  MRI L-spine was normal.  Recommended trial of Lyrica 49m BID which she had tolerated well previously for neuropathic pain.  Follow Up Instructions:  Nina DENZLERis otherwise stable from neuro-onc standpoint.  We would recommend re-imaging brain in 18 months, she is agreeable with this.  I discussed the assessment and treatment plan with the patient.  The patient was provided an opportunity to ask questions and all were answered.  The patient agreed with the plan and demonstrated understanding of the instructions.     The patient was advised to call back or seek an in-person evaluation if the symptoms worsen or if the condition fails to improve as anticipated.    [name] MD   I provided 22 minutes of non face-to-face telephone visit time during this encounter, and > 50% was spent counseling as documented under my assessment & plan.

## 2023-03-05 ENCOUNTER — Encounter: Payer: Self-pay | Admitting: Internal Medicine

## 2023-04-25 ENCOUNTER — Telehealth: Payer: Self-pay | Admitting: Internal Medicine

## 2023-04-25 NOTE — Telephone Encounter (Signed)
Patient is aware of upcoming appointments, patient stated they would call back if they were unable to make it.

## 2023-05-03 ENCOUNTER — Inpatient Hospital Stay: Payer: Medicaid Other | Attending: Internal Medicine | Admitting: Internal Medicine

## 2023-05-03 ENCOUNTER — Inpatient Hospital Stay: Payer: Medicaid Other

## 2023-05-03 ENCOUNTER — Other Ambulatory Visit: Payer: Self-pay

## 2023-05-03 VITALS — BP 113/72 | HR 70 | Temp 98.3°F | Resp 20 | Wt 199.8 lb

## 2023-05-03 DIAGNOSIS — E611 Iron deficiency: Secondary | ICD-10-CM | POA: Insufficient documentation

## 2023-05-03 DIAGNOSIS — G43909 Migraine, unspecified, not intractable, without status migrainosus: Secondary | ICD-10-CM | POA: Diagnosis not present

## 2023-05-03 DIAGNOSIS — Z803 Family history of malignant neoplasm of breast: Secondary | ICD-10-CM | POA: Insufficient documentation

## 2023-05-03 DIAGNOSIS — M5481 Occipital neuralgia: Secondary | ICD-10-CM | POA: Insufficient documentation

## 2023-05-03 DIAGNOSIS — Z8042 Family history of malignant neoplasm of prostate: Secondary | ICD-10-CM | POA: Diagnosis not present

## 2023-05-03 DIAGNOSIS — M79606 Pain in leg, unspecified: Secondary | ICD-10-CM | POA: Diagnosis not present

## 2023-05-03 DIAGNOSIS — G5793 Unspecified mononeuropathy of bilateral lower limbs: Secondary | ICD-10-CM | POA: Diagnosis not present

## 2023-05-03 DIAGNOSIS — G43709 Chronic migraine without aura, not intractable, without status migrainosus: Secondary | ICD-10-CM

## 2023-05-03 DIAGNOSIS — G540 Brachial plexus disorders: Secondary | ICD-10-CM | POA: Diagnosis present

## 2023-05-03 DIAGNOSIS — D500 Iron deficiency anemia secondary to blood loss (chronic): Secondary | ICD-10-CM

## 2023-05-03 LAB — CMP (CANCER CENTER ONLY)
ALT: 7 U/L (ref 0–44)
AST: 12 U/L — ABNORMAL LOW (ref 15–41)
Albumin: 4 g/dL (ref 3.5–5.0)
Alkaline Phosphatase: 58 U/L (ref 38–126)
Anion gap: 7 (ref 5–15)
BUN: 16 mg/dL (ref 6–20)
CO2: 26 mmol/L (ref 22–32)
Calcium: 9.6 mg/dL (ref 8.9–10.3)
Chloride: 103 mmol/L (ref 98–111)
Creatinine: 0.6 mg/dL (ref 0.44–1.00)
GFR, Estimated: >60 mL/min (ref >60)
Glucose, Bld: 83 mg/dL (ref 70–99)
Potassium: 3.9 mmol/L (ref 3.5–5.1)
Sodium: 136 mmol/L (ref 135–145)
Total Bilirubin: 0.4 mg/dL (ref 0.3–1.2)
Total Protein: 7.1 g/dL (ref 6.5–8.1)

## 2023-05-03 LAB — CBC WITH DIFFERENTIAL (CANCER CENTER ONLY)
Abs Immature Granulocytes: 0.01 10*3/uL (ref 0.00–0.07)
Basophils Absolute: 0.1 10*3/uL (ref 0.0–0.1)
Basophils Relative: 1 %
Eosinophils Absolute: 0.2 10*3/uL (ref 0.0–0.5)
Eosinophils Relative: 2 %
HCT: 42.2 % (ref 36.0–46.0)
Hemoglobin: 14.2 g/dL (ref 12.0–15.0)
Immature Granulocytes: 0 %
Lymphocytes Relative: 33 %
Lymphs Abs: 2.4 10*3/uL (ref 0.7–4.0)
MCH: 28 pg (ref 26.0–34.0)
MCHC: 33.6 g/dL (ref 30.0–36.0)
MCV: 83.1 fL (ref 80.0–100.0)
Monocytes Absolute: 0.6 10*3/uL (ref 0.1–1.0)
Monocytes Relative: 8 %
Neutro Abs: 4.1 10*3/uL (ref 1.7–7.7)
Neutrophils Relative %: 56 %
Platelet Count: 396 10*3/uL (ref 150–400)
RBC: 5.08 MIL/uL (ref 3.87–5.11)
RDW: 12.7 % (ref 11.5–15.5)
WBC Count: 7.2 10*3/uL (ref 4.0–10.5)
nRBC: 0 % (ref 0.0–0.2)

## 2023-05-03 LAB — FERRITIN: Ferritin: 5 ng/mL — ABNORMAL LOW (ref 11–307)

## 2023-05-03 LAB — IRON AND IRON BINDING CAPACITY (CC-WL,HP ONLY)
Iron: 111 ug/dL (ref 28–170)
Saturation Ratios: 24 % (ref 10.4–31.8)
TIBC: 456 ug/dL — ABNORMAL HIGH (ref 250–450)
UIBC: 345 ug/dL (ref 148–442)
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
## 2023-05-08 ENCOUNTER — Telehealth: Payer: Self-pay

## 2023-05-08 NOTE — Telephone Encounter (Signed)
Pt advised of lab results regarding her anemia and elevated WBC.  She is not taking oral iron and agreed to medication being prescribed. Please send ro [facility] on [address] in [place]

## 2023-05-08 NOTE — Telephone Encounter (Signed)
-----   Message from Briant Cedar, PA-C sent at 05/08/2023 10:41 AM EDT ----- Please notify patient that anemia has resolved along with elevated WBC count. Her iron level is still low. Can you confirm if she is taking iron pills? If no, she needs to start taking one and I can send her a prescription. If yes, we can arrange for 1 round of IV iron.   Please let me know.  ----- Message ----- From: Interface, Lab In [place] Sent: 05/03/2023   9:11 AM EDT To: Briant Cedar, PA-C

## 2023-05-09 ENCOUNTER — Other Ambulatory Visit: Payer: Self-pay | Admitting: Physician Assistant

## 2023-05-09 ENCOUNTER — Telehealth: Payer: Self-pay | Admitting: Hematology and Oncology

## 2023-05-09 MED ORDER — FERROUS SULFATE 325 (65 FE) MG PO TBEC: 325.0000 mg | DELAYED_RELEASE_TABLET | Freq: Every day | ORAL | 3 refills | Status: AC

## 2023-07-18 ENCOUNTER — Encounter: Payer: Self-pay | Admitting: Internal Medicine

## 2023-07-26 ENCOUNTER — Other Ambulatory Visit: Payer: Self-pay | Admitting: Hematology and Oncology

## 2023-07-26 ENCOUNTER — Inpatient Hospital Stay: Payer: Medicaid Other | Admitting: Hematology and Oncology

## 2023-07-26 ENCOUNTER — Inpatient Hospital Stay: Payer: Medicaid Other | Attending: Internal Medicine

## 2023-07-26 DIAGNOSIS — Z86718 Personal history of other venous thrombosis and embolism: Secondary | ICD-10-CM

## 2023-07-26 NOTE — Progress Notes (Signed)
No show

## 2023-07-27 IMAGING — MR MR HEAD WO/W CM
13 series · 48 of 48 positions shown · IV contrast (gadavist)
Comparison: 02/08/2021.

CLINICAL DATA: Headaches, prior brain imaging with possible lesion.
The do

EXAM:
MRI HEAD WITHOUT AND WITH CONTRAST
TECHNIQUE: Multiplanar, multiecho pulse sequences of the brain and surrounding
structures were obtained without and with intravenous contrast.
CONTRAST:  8mL GADAVIST GADOBUTROL 1 MMOL/ML IV SOLN

[Series 5: DWI · axial · 3.0mm · 1.36mm/px · z∈[-10,+137]mm · 6 of 100 slices shown (1 of 2)]
[im 1/100]
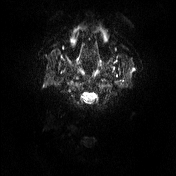
[im 20/100]
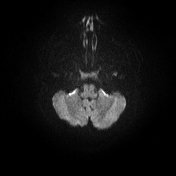
[im 40/100]
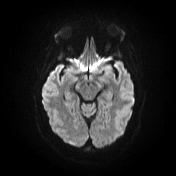
[im 60/100]
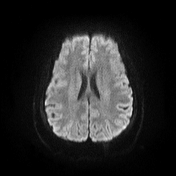
[im 80/100]
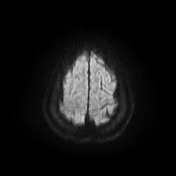
[im 100/100]
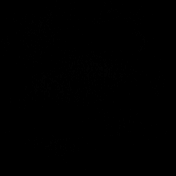

[Series 6: DWI · axial · 3.0mm · 1.36mm/px · z∈[-10,+131]mm · 3 of 48 slices shown (2 of 2)]
[im 1/48]
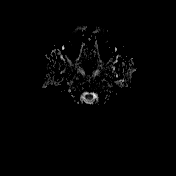
[im 24/48]
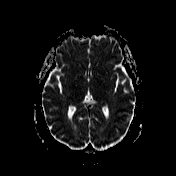
[im 48/48]
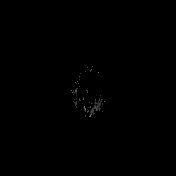

[Series 7: T1 · sagittal · 5.0mm · 0.75mm/px · 1 of 24 slices shown (1 of 2)]
[im 1/24]
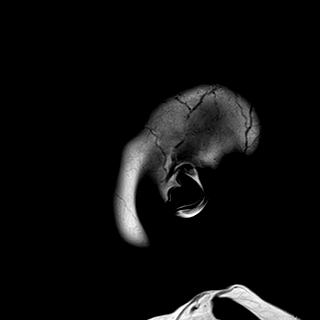

[Series 8: T2 · axial · 5.0mm · 0.62mm/px · z∈[-24,+138]mm · 2 of 26 slices shown]
[im 1/26]
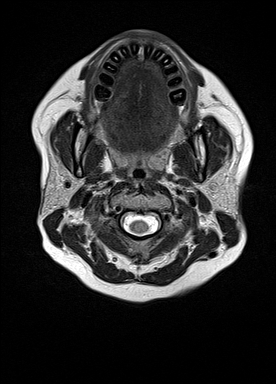
[im 26/26]
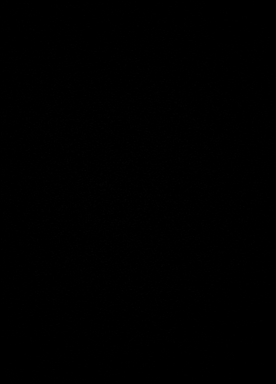

[Series 9: swi_images · axial · 3.0mm · 0.75mm/px · z∈[-19,+134]mm · 3 of 52 slices shown]
[im 1/52]
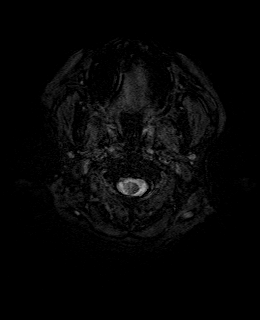
[im 26/52]
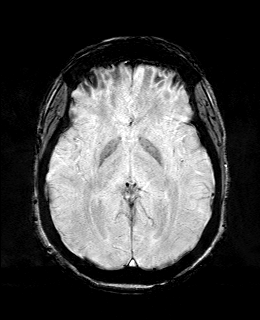
[im 52/52]
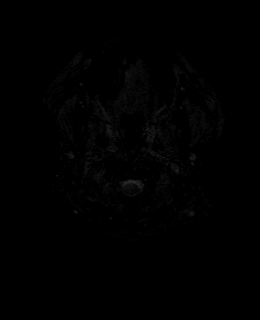

[Series 11: FLAIR · axial · 3.0mm · 0.75mm/px · z∈[-19,+134]mm · 3 of 52 slices shown]
[im 1/52]
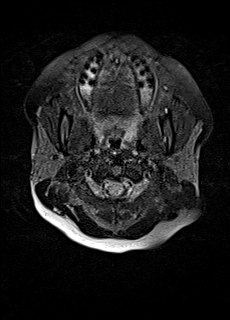
[im 26/52]
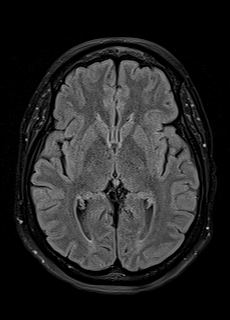
[im 52/52]
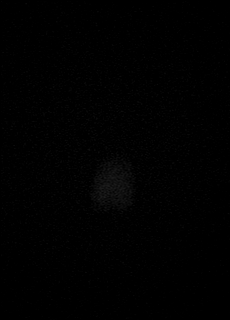

[Series 12: T1 · axial · 1.0mm · 0.94mm/px · z∈[-16,+143]mm · 10 of 160 slices shown (2 of 2)]
[im 1/160]
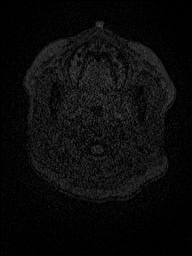
[im 18/160]
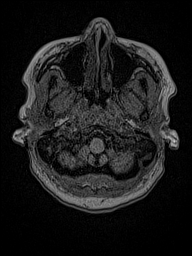
[im 36/160]
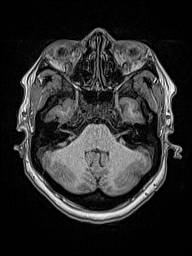
[im 54/160]
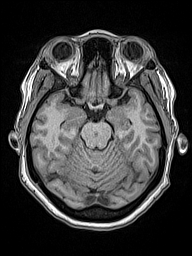
[im 71/160]
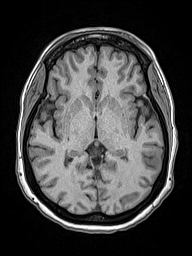
[im 89/160]
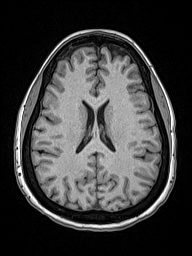
[im 107/160]
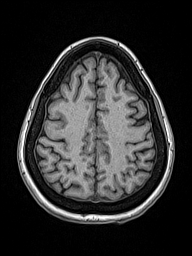
[im 124/160]
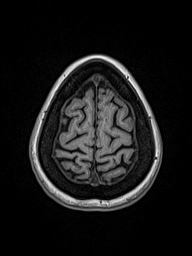
[im 142/160]
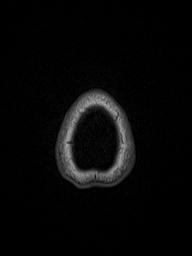
[im 160/160]
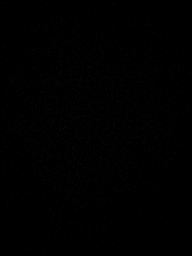

[Series 13: cor dwi_tracew · coronal · 5.0mm · 1.53mm/px · 3 of 54 slices shown]
[im 1/54]
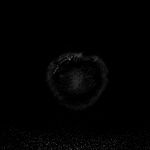
[im 27/54]
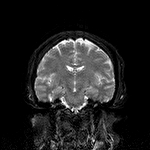
[im 54/54]
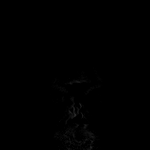

[Series 14: cor dwi_adc · coronal · 5.0mm · 1.53mm/px · 2 of 27 slices shown]
[im 1/27]
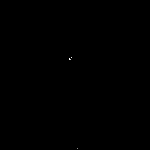
[im 27/27]
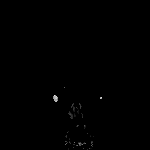

[Series 15: T2 post-contrast · coronal · 5.0mm · 0.57mm/px · 2 of 30 slices shown]
[im 1/30]
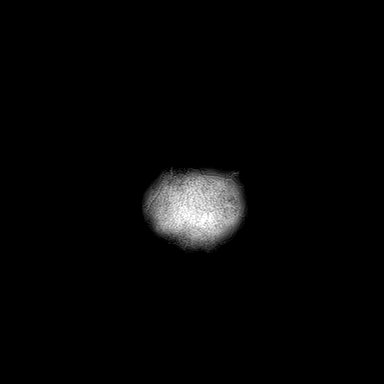
[im 30/30]
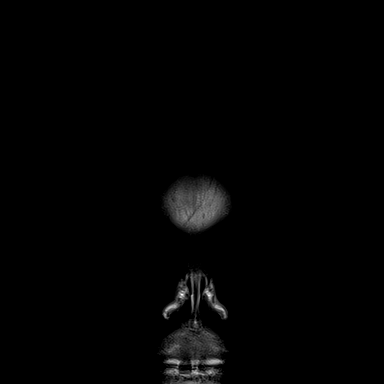

[Series 16: T1 post-contrast · axial · 1.0mm · 0.94mm/px · z∈[-16,+143]mm · 10 of 159 slices shown (1 of 3)]
[im 1/159]
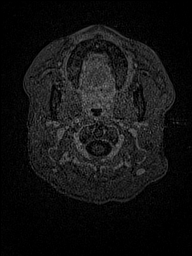
[im 18/159]
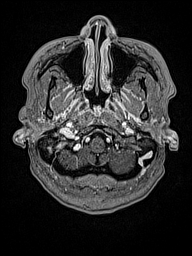
[im 36/159]
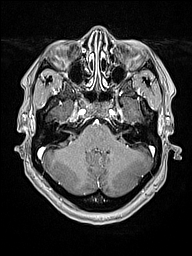
[im 53/159]
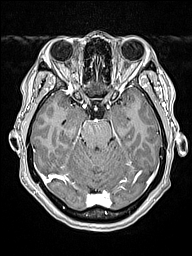
[im 71/159]
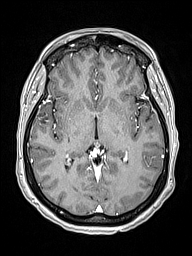
[im 88/159]
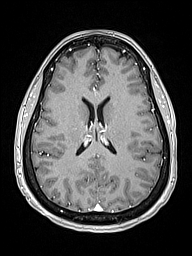
[im 106/159]
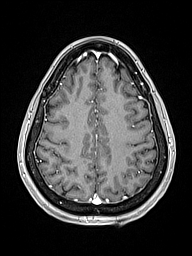
[im 123/159]
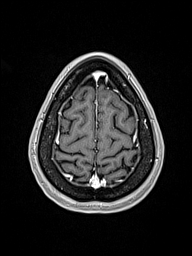
[im 141/159]
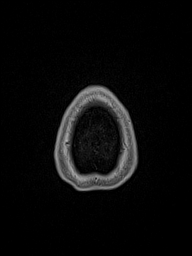
[im 159/159]
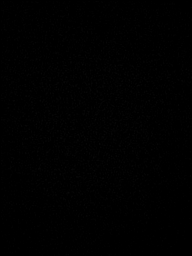

[Series 17: T1 post-contrast · coronal · 5.0mm · 0.43mm/px · 2 of 30 slices shown (2 of 3)]
[im 1/30]
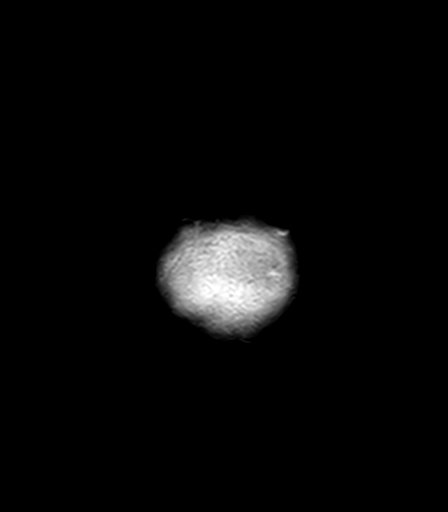
[im 30/30]
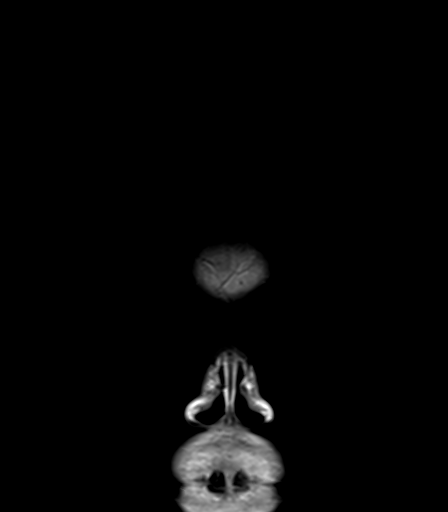

[Series 18: T1 post-contrast · sagittal · 5.0mm · 0.75mm/px · 1 of 24 slices shown (3 of 3)]
[im 1/24]
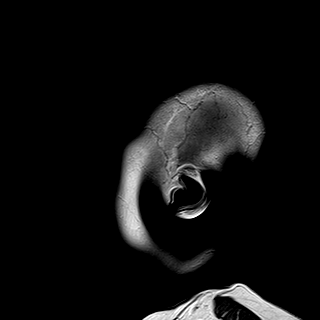

[48 of 48 positions shown; findings below may reference images not displayed]

FINDINGS: Brain: Redemonstrated area of increased T2 signal in the right
frontoparietal subcortical white matter (series 11, image 36), which
11 x 5 mm, previously 11 x 5 mm when remeasured similarly. On the
present study, previously mentioned intrinsic T1 signal and/or
contrast enhancement are not seen. No restricted diffusion is
associated with this lesion. No new areas of T2 hyperintensity.

No acute hemorrhage, infarct, mass, mass effect, or midline shift.
No abnormal enhancement.

Vascular: Normal flow voids.

Skull and upper cervical spine: Normal marrow signal.

Sinuses/Orbits: Negative.

Other: None.
IMPRESSION: 1. Redemonstrated small nonenhancing area of increased T2 signal in
the right frontoparietal will white matter, unchanged in size since
5146. This may be represent low-grade gliosis from a remote insult
or a low-grade glioma.
2. No acute intracranial process.

## 2023-10-01 ENCOUNTER — Ambulatory Visit: Payer: Medicaid Other | Admitting: Podiatry

## 2023-10-02 ENCOUNTER — Ambulatory Visit (INDEPENDENT_AMBULATORY_CARE_PROVIDER_SITE_OTHER): Payer: Medicaid Other | Admitting: Podiatry

## 2023-10-02 ENCOUNTER — Encounter: Payer: Self-pay | Admitting: Internal Medicine

## 2023-10-02 DIAGNOSIS — Z91199 Patient's noncompliance with other medical treatment and regimen due to unspecified reason: Secondary | ICD-10-CM

## 2023-10-02 NOTE — Progress Notes (Signed)
No show

## 2023-10-04 ENCOUNTER — Other Ambulatory Visit: Payer: Self-pay | Admitting: Internal Medicine

## 2023-10-04 DIAGNOSIS — R9089 Other abnormal findings on diagnostic imaging of central nervous system: Secondary | ICD-10-CM

## 2023-10-05 ENCOUNTER — Emergency Department (HOSPITAL_COMMUNITY)
Admission: EM | Admit: 2023-10-05 | Discharge: 2023-10-05 | Disposition: A | Discharge status: Home / Self Care | Payer: Medicaid Other | Attending: Emergency Medicine | Admitting: Emergency Medicine

## 2023-10-05 ENCOUNTER — Emergency Department (HOSPITAL_COMMUNITY): Payer: Medicaid Other

## 2023-10-05 ENCOUNTER — Emergency Department (HOSPITAL_BASED_OUTPATIENT_CLINIC_OR_DEPARTMENT_OTHER)
Admit: 2023-10-05 | Discharge: 2023-10-05 | Disposition: A | Discharge status: Home / Self Care | Payer: Medicaid Other | Attending: Emergency Medicine | Admitting: Emergency Medicine

## 2023-10-05 ENCOUNTER — Other Ambulatory Visit: Payer: Self-pay

## 2023-10-05 ENCOUNTER — Encounter (HOSPITAL_COMMUNITY): Payer: Self-pay

## 2023-10-05 DIAGNOSIS — M79662 Pain in left lower leg: Secondary | ICD-10-CM

## 2023-10-05 DIAGNOSIS — S6991XA Unspecified injury of right wrist, hand and finger(s), initial encounter: Secondary | ICD-10-CM | POA: Diagnosis present

## 2023-10-05 DIAGNOSIS — S61210A Laceration without foreign body of right index finger without damage to nail, initial encounter: Secondary | ICD-10-CM | POA: Insufficient documentation

## 2023-10-05 DIAGNOSIS — W269XXA Contact with unspecified sharp object(s), initial encounter: Secondary | ICD-10-CM | POA: Diagnosis not present

## 2023-10-05 DIAGNOSIS — N939 Abnormal uterine and vaginal bleeding, unspecified: Secondary | ICD-10-CM | POA: Insufficient documentation

## 2023-10-05 DIAGNOSIS — Z91010 Allergy to peanuts: Secondary | ICD-10-CM | POA: Insufficient documentation

## 2023-10-05 DIAGNOSIS — R079 Chest pain, unspecified: Secondary | ICD-10-CM | POA: Insufficient documentation

## 2023-10-05 DIAGNOSIS — M79672 Pain in left foot: Secondary | ICD-10-CM | POA: Diagnosis not present

## 2023-10-05 LAB — HCG, SERUM, QUALITATIVE: Preg, Serum: NEGATIVE

## 2023-10-05 LAB — CBC WITH DIFFERENTIAL/PLATELET
Abs Immature Granulocytes: 0.05 10*3/uL (ref 0.00–0.07)
Basophils Absolute: 0 10*3/uL (ref 0.0–0.1)
Basophils Relative: 0 %
Eosinophils Absolute: 0.1 10*3/uL (ref 0.0–0.5)
Eosinophils Relative: 1 %
HCT: 40.5 % (ref 36.0–46.0)
Hemoglobin: 13.1 g/dL (ref 12.0–15.0)
Immature Granulocytes: 1 %
Lymphocytes Relative: 29 %
Lymphs Abs: 3.1 10*3/uL (ref 0.7–4.0)
MCH: 27.5 pg (ref 26.0–34.0)
MCHC: 32.3 g/dL (ref 30.0–36.0)
MCV: 85.1 fL (ref 80.0–100.0)
Monocytes Absolute: 0.8 10*3/uL (ref 0.1–1.0)
Monocytes Relative: 8 %
Neutro Abs: 6.5 10*3/uL (ref 1.7–7.7)
Neutrophils Relative %: 61 %
Platelets: 408 10*3/uL — ABNORMAL HIGH (ref 150–400)
RBC: 4.76 MIL/uL (ref 3.87–5.11)
RDW: 14.1 % (ref 11.5–15.5)
WBC: 10.5 10*3/uL (ref 4.0–10.5)
nRBC: 0 % (ref 0.0–0.2)

## 2023-10-05 LAB — COMPREHENSIVE METABOLIC PANEL
ALT: 14 U/L (ref 0–44)
AST: 14 U/L — ABNORMAL LOW (ref 15–41)
Albumin: 3.3 g/dL — ABNORMAL LOW (ref 3.5–5.0)
Alkaline Phosphatase: 37 U/L — ABNORMAL LOW (ref 38–126)
Anion gap: 8 (ref 5–15)
BUN: 18 mg/dL (ref 6–20)
CO2: 23 mmol/L (ref 22–32)
Calcium: 8.7 mg/dL — ABNORMAL LOW (ref 8.9–10.3)
Chloride: 106 mmol/L (ref 98–111)
Creatinine, Ser: 0.62 mg/dL (ref 0.44–1.00)
GFR, Estimated: >60 mL/min (ref >60)
Glucose, Bld: 106 mg/dL — ABNORMAL HIGH (ref 70–99)
Potassium: 3.9 mmol/L (ref 3.5–5.1)
Sodium: 137 mmol/L (ref 135–145)
Total Bilirubin: 0.6 mg/dL (ref <1.2)
Total Protein: 6.8 g/dL (ref 6.5–8.1)

## 2023-10-05 LAB — TROPONIN I (HIGH SENSITIVITY)
Troponin I (High Sensitivity): <2 ng/L (ref <18)
Troponin I (High Sensitivity): <2 ng/L (ref <18)

## 2023-10-05 LAB — URINALYSIS, W/ REFLEX TO CULTURE (INFECTION SUSPECTED)
Bacteria, UA: NONE SEEN
Bilirubin Urine: NEGATIVE
Glucose, UA: NEGATIVE mg/dL
Ketones, ur: NEGATIVE mg/dL
Leukocytes,Ua: NEGATIVE
Nitrite: NEGATIVE
Protein, ur: NEGATIVE mg/dL
Specific Gravity, Urine: 1.025 (ref 1.005–1.030)
pH: 5 (ref 5.0–8.0)

## 2023-10-05 LAB — PROTIME-INR
INR: 0.9 (ref 0.8–1.2)
Prothrombin Time: 12.3 s (ref 11.4–15.2)

## 2023-10-05 LAB — TYPE AND SCREEN
ABO/RH(D): A POS
Antibody Screen: NEGATIVE

## 2023-10-05 MED ORDER — IOHEXOL 350 MG/ML SOLN
75.0000 mL | Freq: Once | INTRAVENOUS | Status: AC | PRN
  Administered 2023-10-05: 75 mL via INTRAVENOUS

## 2023-10-05 NOTE — ED Notes (Addendum)
Patient requesting right first finger wound to be cleaned. Wound soaking in an iodine bath.

## 2023-10-05 NOTE — Discharge Instructions (Signed)
It was a pleasure taking care of you here in the emergency department.  Your workup today is reassuring.  Make sure to discuss with hematology and your OB/GYN  Return for new or worsening symptoms

## 2023-10-05 NOTE — ED Triage Notes (Signed)
Patient BIB EMS for abd pain, nausea, and vaginal bleeding x 2 weeks. Lac on finger from yesterday will not stop bleeding. Hx of dvt, Has not been on blood thinners in 4 months.

## 2023-10-05 NOTE — Progress Notes (Signed)
Left lower extremity venous duplex has been completed. Preliminary results can be found in CV Proc through chart review.  Results were given to [facility] PA.  10/05/23 4:11 PM Olen Cordial RVT

## 2023-10-05 NOTE — ED Provider Notes (Signed)
[place] [facility]ERG[facility] DEPARTMENT AT [facility] Provider Note   CSN: [id number] Arrival date & time: 10/05/23  1456    History  Chief Complaint  Patient presents with   Vaginal Bleeding    Nina Wood is a 37 y.o. female here for evaluation with complaints.  She has a history of unprovoked DVT not currently anticoagulated, paroxysmal shortness of breath, fibromyalgia, anemia, chronic pain syndrome, uterine fibroids here for evaluation multiple complaints.  She has had generalized lower abdominal cramping, worse on the right intermittently over the last year or so.  She has had vaginal bleeding for the last 2 weeks.  Changing pad or tampon every 3 hours.  She has had some nausea.  No changes in stools.  She also notes she cut her finger yesterday and has had slow persistent oozing.  Tetanus is up-to-date.  She also notes pain to her left leg.  Starts out at her plantar aspect of her foot and goes to the posterior aspect of her leg.  No swelling, redness.  This has been an ongoing issue for many months as well.  Waxes and wanes in intensity.  No fever, headache, numbness, weakness.  HPI     Home Medications Prior to Admission medications   Medication Sig Start Date End Date Taking? Authorizing Provider  Albuterol Sulfate, sensor, (PROAIR DIGIHALER) 108 (90 Base) MCG/ACT AEPB Proair Digihaler 90 mcg/actuation aerosol powder breath act, sensor  INHALE 2 PUFFS EVERY 4 HOURS AS NEEDED.    [provider]  cetirizine (ZYRTEC) 10 MG tablet Take 20-30 mg by mouth at bedtime.     [provider]  Doxylamine-Pyridoxine 10-10 MG TBEC Take 2 tablets by mouth daily.    [provider]  enoxaparin (LOVENOX) 40 MG/0.4ML injection enoxaparin 40 mg/0.4 mL subcutaneous syringe  Inject 0.4 mL twice a day by subcutaneous route.    [provider]  EPINEPHRINE 0.3 mg/0.3 mL IJ SOAJ injection USE AS DIRECTED FOR LIFE THREATENING ALLERGIC REACTIONS 07/18/19    Kozlow, Alvira Philips, MD  ferrous sulfate 325 (65 FE) MG EC tablet Take 1 tablet (325 mg total) by mouth daily with breakfast. 05/09/23   Thayil, Nelia Shi, PA-C  Fremanezumab-vfrm (AJOVY) 225 MG/1.5ML SOSY INJECT 225 MG UNDER THE SKIN EVERY 30 DAYS 07/07/21   Vaslow, Georgeanna Lea, MD  Hyprom-Naphaz-Polysorb-Zn Sulf (CLEAR EYES COMPLETE OP) Place 1 drop into both eyes 2 (two) times daily as needed (dry eyes/itching).     [provider]  montelukast (SINGULAIR) 10 MG tablet TAKE 1 TABLET BY MOUTH EVERY DAY 01/07/19   Kozlow, Alvira Philips, MD  omeprazole (PRILOSEC) 20 MG capsule Take 1 capsule (20 mg total) by mouth daily. 06/19/22   Bernerd Limbo, CNM  pregabalin (LYRICA) 75 MG capsule Take 1 capsule (75 mg total) by mouth 2 (two) times daily. 12/28/22   Vaslow, Georgeanna Lea, MD  Prenatal Vit-Fe Fumarate-FA (PRENATAL VITAMINS PO) Take by mouth.    [provider]  triamcinolone cream (KENALOG) 0.5 % Apply 1 application topically every 4 (four) hours as needed (rash/allergies).     [provider]      Allergies    Fish allergy, Other, Penicillins, Shellfish allergy, Peanut butter flavor, Histamine, and Voltaren [diclofenac]    Review of Systems   Review of Systems  Constitutional: Negative.   HENT: Negative.    Cardiovascular:  Positive for chest pain (x 1 year). Negative for palpitations and leg swelling.  Gastrointestinal:  Positive for abdominal pain (intermittent  x months) and nausea. Negative for constipation, diarrhea, rectal pain and vomiting.  Genitourinary:  Positive for vaginal bleeding (x 2 weeks).  Musculoskeletal: Negative.        Left lower extremity pain x months  Neurological: Negative.   All other systems reviewed and are negative.   Physical Exam Updated Vital Signs BP (!) 137/93   Pulse 69   Temp 97.8 F (36.6 C) (Oral)   Resp 15   Ht 4\' 7"  (1.397 m)   Wt 90.6 kg   LMP 09/21/2023   SpO2 98%   BMI 46.42 kg/m  Physical Exam Vitals and nursing note  reviewed.  Constitutional:      General: She is not in acute distress.    Appearance: She is well-developed. She is not ill-appearing, toxic-appearing or diaphoretic.  HENT:     Head: Normocephalic and atraumatic.     Nose: Nose normal.     Mouth/Throat:     Mouth: Mucous membranes are moist.  Eyes:     Pupils: Pupils are equal, round, and reactive to light.  Cardiovascular:     Rate and Rhythm: Normal rate.     Pulses: Normal pulses.     Heart sounds: Normal heart sounds.  Pulmonary:     Effort: Pulmonary effort is normal. No respiratory distress.     Breath sounds: Normal breath sounds.  Abdominal:     General: Bowel sounds are normal. There is no distension.     Palpations: Abdomen is soft. There is no mass.     Tenderness: There is no abdominal tenderness. There is no right CVA tenderness, left CVA tenderness, guarding or rebound.     Hernia: No hernia is present.  Musculoskeletal:        General: Tenderness present. Normal range of motion.     Cervical back: Normal range of motion.     Comments: Tenderness to left calcaneous.  To plantarflex, dorsiflex, compartment soft, no overlying erythema or warmth.  5mm laceration right index finger, no active bleeding.  Superficial.  Skin:    General: Skin is warm and dry.     Capillary Refill: Capillary refill takes less than 2 seconds.     Findings: Laceration present.  Neurological:     General: No focal deficit present.     Mental Status: She is alert.     Cranial Nerves: Cranial nerves 2-12 are intact.     Sensory: Sensation is intact.     Motor: Motor function is intact.     Coordination: Coordination is intact.     Gait: Gait is intact.  Psychiatric:        Mood and Affect: Mood normal.    ED Results / Procedures / Treatments   Labs (all labs ordered are listed, but only abnormal results are displayed) Labs Reviewed  CBC WITH DIFFERENTIAL/PLATELET - Abnormal; Notable for the following components:      Result Value    Platelets 408 (*)    All other components within normal limits  URINALYSIS, W/ REFLEX TO CULTURE (INFECTION SUSPECTED) - Abnormal; Notable for the following components:   APPearance HAZY (*)    Hgb urine dipstick MODERATE (*)    All other components within normal limits  COMPREHENSIVE METABOLIC PANEL - Abnormal; Notable for the following components:   Glucose, Bld 106 (*)    Calcium 8.7 (*)    Albumin 3.3 (*)    AST 14 (*)    Alkaline Phosphatase 37 (*)    All other  components within normal limits  HCG, SERUM, QUALITATIVE  PROTIME-INR  TYPE AND SCREEN  TROPONIN I (HIGH SENSITIVITY)  TROPONIN I (HIGH SENSITIVITY)    EKG None  Radiology CT Angio Chest PE W and/or Wo Contrast  Result Date: 10/05/2023 CLINICAL DATA:  Pulmonary embolism (PE) suspected, high prob sob, hx of recurrent DVT, not on thinners EXAM: CT ANGIOGRAPHY CHEST WITH CONTRAST TECHNIQUE: Multidetector CT imaging of the chest was performed using the standard protocol during bolus administration of intravenous contrast. Multiplanar CT image reconstructions and MIPs were obtained to evaluate the vascular anatomy. RADIATION DOSE REDUCTION: This exam was performed according to the departmental dose-optimization program which includes automated exposure control, adjustment of the mA and/or kV according to patient size and/or use of iterative reconstruction technique. CONTRAST:  75mL OMNIPAQUE IOHEXOL 350 MG/ML SOLN COMPARISON:  Chest radiograph 06/29/2021 chest CT 02/24/2017 FINDINGS: Cardiovascular: There are no filling defects within the pulmonary arteries to suggest pulmonary embolus. The thoracic aorta is normal in caliber, no dissection. Mild cardiomegaly. No pericardial effusion. Mediastinum/Nodes: No mediastinal or hilar adenopathy. Unremarkable appearance of the esophagus. No thyroid nodule. Lungs/Pleura: Clear lungs. No focal airspace disease. No pleural effusion or pneumothorax. No features of pulmonary edema. Trachea  and central airways are clear. Upper Abdomen: Gastric bypass anatomy. No acute upper abdominal findings. Musculoskeletal: There are no acute or suspicious osseous abnormalities. No chest wall soft tissue abnormalities. Review of the MIP images confirms the above findings. IMPRESSION: 1. No pulmonary embolus or acute intrathoracic abnormality. 2. Mild cardiomegaly. Electronically Signed   By: Narda Rutherford M.D.   On: 10/05/2023 20:09   DG Foot Complete Left  Result Date: 10/05/2023 CLINICAL DATA:  Left foot pain. EXAM: LEFT FOOT - COMPLETE 3+ VIEW COMPARISON:  None Available. FINDINGS: No acute fracture or dislocation. No aggressive osseous lesion. Ankle mortise appears intact. No focal soft tissue swelling. No radiopaque foreign bodies. IMPRESSION: *No acute osseous abnormality of the left foot. Electronically Signed   By: Jules Schick M.D.   On: 10/05/2023 18:09   US PELVIS (TRANSABDOMINAL ONLY)  Result Date: 10/05/2023 CLINICAL DATA:  [id number] Pelvic pain [id number] EXAM: TRANSABDOMINAL ULTRASOUND OF PELVIS DOPPLER ULTRASOUND OF OVARIES TECHNIQUE: Transabdominal ultrasound examination of the pelvis was performed including evaluation of the uterus, ovaries, adnexal regions, and pelvic cul-de-sac. Color and duplex Doppler ultrasound was utilized to evaluate blood flow to the ovaries. Please note patient refused transvaginal exam. COMPARISON:  None Available. FINDINGS: The technologist noted suboptimal exam due to overlying bowel gas. Please note examination is also limited due to only transabdominal technique. Uterus Measurements: 3.9 x 5.9 x 9.9 cm = volume: 119.1 mL. There is probable leiomyoma in the right side measured approximately 1.8 x 2.0 x 2.1 cm. Endometrium Thickness: 4.6 mm. No focal abnormality visualized. Note is made of intrauterine device, which appears in satisfactory position. Right ovary Measurements: 2.2 x 3.8 x 4.4 cm = volume: 18.9 mL. Normal appearance/no adnexal mass. Left ovary  Measurements: 1.8 x 2.0 x 3.3 cm = volume: 6.4 mL. Normal appearance/no adnexal mass. Pulsed Doppler evaluation demonstrates normal low-resistance arterial and venous waveforms in both ovaries. Other: None. IMPRESSION: *Limited exam due to overlying bowel gas and transabdominal technique. *There is a probable leiomyoma in the right side of the uterus measuring up to 1.8 x 2.0 x 2.1 cm. * While demonstrable color flow and spectral Doppler in an ovary cannot completely exclude ovarian torsion, in combination with a normal grayscale appearance of the ovary, this makes torsion highly unlikely. *  Other observations, as described above. Electronically Signed   By: Jules Schick M.D.   On: 10/05/2023 17:37   US ABDOMINAL PELVIC ART/VENT FLOW DOPPLER  Result Date: 10/05/2023 CLINICAL DATA:  [id number] Pelvic pain [id number] EXAM: TRANSABDOMINAL ULTRASOUND OF PELVIS DOPPLER ULTRASOUND OF OVARIES TECHNIQUE: Transabdominal ultrasound examination of the pelvis was performed including evaluation of the uterus, ovaries, adnexal regions, and pelvic cul-de-sac. Color and duplex Doppler ultrasound was utilized to evaluate blood flow to the ovaries. Please note patient refused transvaginal exam. COMPARISON:  None Available. FINDINGS: The technologist noted suboptimal exam due to overlying bowel gas. Please note examination is also limited due to only transabdominal technique. Uterus Measurements: 3.9 x 5.9 x 9.9 cm = volume: 119.1 mL. There is probable leiomyoma in the right side measured approximately 1.8 x 2.0 x 2.1 cm. Endometrium Thickness: 4.6 mm. No focal abnormality visualized. Note is made of intrauterine device, which appears in satisfactory position. Right ovary Measurements: 2.2 x 3.8 x 4.4 cm = volume: 18.9 mL. Normal appearance/no adnexal mass. Left ovary Measurements: 1.8 x 2.0 x 3.3 cm = volume: 6.4 mL. Normal appearance/no adnexal mass. Pulsed Doppler evaluation demonstrates normal low-resistance arterial and venous  waveforms in both ovaries. Other: None. IMPRESSION: *Limited exam due to overlying bowel gas and transabdominal technique. *There is a probable leiomyoma in the right side of the uterus measuring up to 1.8 x 2.0 x 2.1 cm. * While demonstrable color flow and spectral Doppler in an ovary cannot completely exclude ovarian torsion, in combination with a normal grayscale appearance of the ovary, this makes torsion highly unlikely. *Other observations, as described above. Electronically Signed   By: Jules Schick M.D.   On: 10/05/2023 17:37   VAS [place] LOWER EXTREMITY VENOUS (DVT) (ONLY [facility] & [facility])  Result Date: 10/05/2023  Lower Venous DVT Study Patient Name:  EMMANUEL LUDLUM  Date of Exam:   10/05/2023 Medical Rec #: [id number]      Accession #:    [id number] Date of Birth: 04-09-1986      Patient Gender: F Patient Age:   86 years Exam Location:  [facility] Procedure:      VAS [place] LOWER EXTREMITY VENOUS (DVT) Referring Phys: Gill Delrossi --------------------------------------------------------------------------------  Indications: Pain.  Risk Factors: None identified. Limitations: Poor ultrasound/tissue interface. Comparison Study: No prior studies. Performing Technologist: Chanda Busing RVT  Examination Guidelines: A complete evaluation includes B-mode imaging, spectral Doppler, color Doppler, and power Doppler as needed of all accessible portions of each vessel. Bilateral testing is considered an integral part of a complete examination. Limited examinations for reoccurring indications may be performed as noted. The reflux portion of the exam is performed with the patient in reverse Trendelenburg.  +-----+---------------+---------+-----------+----------+--------------+ RIGHTCompressibilityPhasicitySpontaneityPropertiesThrombus Aging +-----+---------------+---------+-----------+----------+--------------+ CFV  Full           Yes      Yes                                  +-----+---------------+---------+-----------+----------+--------------+   +---------+---------------+---------+-----------+----------+--------------+ LEFT     CompressibilityPhasicitySpontaneityPropertiesThrombus Aging +---------+---------------+---------+-----------+----------+--------------+ CFV      Full           Yes      Yes                                 +---------+---------------+---------+-----------+----------+--------------+ SFJ      Full                                                        +---------+---------------+---------+-----------+----------+--------------+  FV Prox  Full                                                        +---------+---------------+---------+-----------+----------+--------------+ FV Mid   Full                                                        +---------+---------------+---------+-----------+----------+--------------+ FV DistalFull                                                        +---------+---------------+---------+-----------+----------+--------------+ PFV      Full                                                        +---------+---------------+---------+-----------+----------+--------------+ POP      Full           Yes      Yes                                 +---------+---------------+---------+-----------+----------+--------------+ PTV      Full                                                        +---------+---------------+---------+-----------+----------+--------------+ PERO     Full                                                        +---------+---------------+---------+-----------+----------+--------------+    Summary: RIGHT: - No evidence of common femoral vein obstruction.   LEFT: - There is no evidence of deep vein thrombosis in the lower extremity.  - No cystic structure found in the popliteal fossa.  *See table(s) above for measurements and observations.    Preliminary      Procedures Procedures    Medications Ordered in ED Medications  iohexol (OMNIPAQUE) 350 MG/ML injection 75 mL (75 mLs Intravenous Contrast Given 10/05/23 1908)    ED Course/ Medical Decision Making/ A&P   37 year old here for evaluation multiple complaints.  She is afebrile, nonseptic, not ill-appearing.  Patient neurovascularly intact.  Chest pain-nonexertional nature, does have pleuritic component has been intermittent over the last year.  No PND, orthopnea.  She does have history of unprovoked VTE's previously she is not currently anticoagulated.  She also admits to some left leg pain intermittently over the last year or so.  Will plan on imaging, labs  Laceration right index finger occurred yesterday.  Her tetanus is up-to-date.  She is neurovascular intact.  Wound is superficial, not active bleeding.  She has full range of motion low suspicion for acute bony, vascular, tendon or ligament injury.  Wound was cleaned.  Does not need suturing, also given timing risk outweighs benefit for closure.  No nailbed injury  Left leg pain starts at left calcaneus goes into the posterior aspect of her leg.  Does have history of plantar fasciitis.  Previously followed by podiatry.  Not currently following.  She is neurovascular intact, compartments soft she denies any recent falls or injuries will plan on ultrasound  Vaginal bleeding-over the last 2 weeks.  No lightheadedness or dizziness.  Changing pad or tampon every 3 hours.  Not currently followed by OB/GYN.  Plan on labs, ultrasound does have history of uterine fibroids.   Labs and imaging personally viewed and interpreted:   CBC without leukocytosis, hemoglobin 13.1 UA negative for infection INR 0.9 Metabolic panel without acute abnormality Troponin less than 2 Pregnancy test negative Ultrasound negative for DVT  ultrasound pelvic negative for torsion, does show fibroid uterus consistent with patient's history CTA negative for PE,  infection, edema X-ray left foot without acute abnormality EKG without ischemic changes   Patient reassessed.  We discussed labs and imaging  Shared decision making regarding vaginal bleeding, hemoglobin is stable.  I feel risk outweighs benefit at this time to start on hormonal therapy given her history of multiple unprovoked DVTs.  I encouraged her to follow-up with her hematologist as well as OB/GYN to have a shared decision making with them.  In the meantime I will have her follow-up outpatient, return for any worsening symptoms.  Patient is nontoxic, nonseptic appearing, in no apparent distress.  Patient's pain and other symptoms adequately managed in emergency department.  Fluid bolus given.  Labs, imaging and vitals reviewed.  Patient does not meet the SIRS or Sepsis criteria.  On repeat exam patient does not have a surgical abdomin and there are no peritoneal signs.  No indication of appendicitis, bowel obstruction, bowel perforation, cholecystitis, diverticulitis, PID, intermittent, persistent torsion, TOA or ectopic pregnancy, ACS, PE, dissection.  Patient discharged home with symptomatic treatment and given strict instructions for follow-up with their primary care physician.  I have also discussed reasons to return immediately to the ER.  Patient expresses understanding and agrees with plan.                                 Medical Decision Making Amount and/or Complexity of Data Reviewed External Data Reviewed: labs, radiology, ECG and notes. Labs: ordered. Decision-making details documented in ED Course. Radiology: ordered and independent interpretation performed. Decision-making details documented in ED Course. ECG/medicine tests: ordered and independent interpretation performed. Decision-making details documented in ED Course.  Risk OTC drugs. Prescription drug management. Parenteral controlled substances. Decision regarding hospitalization. Diagnosis or treatment significantly  limited by social determinants of health.          Final Clinical Impression(s) / ED Diagnoses Final diagnoses:  Vaginal bleeding  Chest pain, unspecified type  Left foot pain  Laceration of right index finger without foreign body without damage to nail, initial encounter    Rx / DC Orders ED Discharge Orders     None         Adalynd Donahoe A, PA-C 10/05/23 2129    Lorre Nick, MD 10/09/23 [phone]

## 2023-10-08 ENCOUNTER — Encounter: Payer: Self-pay | Admitting: *Deleted

## 2023-10-15 ENCOUNTER — Ambulatory Visit: Payer: Medicaid Other | Admitting: Podiatry

## 2023-10-15 ENCOUNTER — Encounter: Payer: Self-pay | Admitting: Podiatry

## 2023-10-15 DIAGNOSIS — M7672 Peroneal tendinitis, left leg: Secondary | ICD-10-CM

## 2023-10-15 DIAGNOSIS — M722 Plantar fascial fibromatosis: Secondary | ICD-10-CM | POA: Diagnosis not present

## 2023-10-15 DIAGNOSIS — M7662 Achilles tendinitis, left leg: Secondary | ICD-10-CM | POA: Diagnosis not present

## 2023-10-15 MED ORDER — METHYLPREDNISOLONE 4 MG PO TBPK: ORAL_TABLET | ORAL | 0 refills | Status: DC

## 2023-10-15 MED ORDER — MELOXICAM 15 MG PO TABS: 15.0000 mg | ORAL_TABLET | Freq: Every day | ORAL | 0 refills | Status: DC

## 2023-10-15 MED ORDER — TRIAMCINOLONE ACETONIDE 10 MG/ML IJ SUSP
10.0000 mg | Freq: Once | INTRAMUSCULAR | Status: AC
  Administered 2023-10-15: 10 mg via INTRAMUSCULAR

## 2023-10-15 NOTE — Progress Notes (Signed)
Chief Complaint  Patient presents with   Foot Pain    Tenosynovitis bilateral, left side worse, pain level about 9. Worse on outer side. Having issues since Sept 2023.    HPI: 37 y.o. female presents today with several bilateral musculoskeletal complaints.  She states that she has had bilateral heel pain dating back to September 2023 and treated with injections to the left heel for a period of time and did not experience full improvement.  Over the past couple months most of her pain has been over the left lateral ankle and lateral portion of foot.  She also complains of pain to the posterior heel.  She does still experience pain to the instep of the left and the right heels.  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  History of fibromyalgia. Patient states she cannot stay off her feet due to young twin children. Recently went to ED for chest pain and shortness of breath 11/22, workup was negative for any new PE or DVT.  Patient denies nausea, vomiting, fever, chills Past Medical History:  Diagnosis Date   Allergic rhinitis    Allergy to cats    Allergy to dogs    Anemia    Anxiety    Asthma    BMI 37.0-37.9, adult    Brain tumor (benign) (HCC)    Breast cyst    Chronic pain syndrome    Deep vein thrombosis (DVT) (HCC)    DVT (deep venous thrombosis) (HCC)    DVT (deep venous thrombosis) (HCC)    Endometriosis, uterus    Environmental allergies    Fibromyalgia syndrome    GERD (gastroesophageal reflux disease)    Headache    lesion on brain that causes migraines   Hip discomfort    IBS (irritable bowel syndrome)    with diarrhea   Neuropathy    in left leg lower due to vein problem   Obesity    Ovarian cyst    Paroxysmal dyspnea    PTSD (post-traumatic stress disorder)    Patient reported   Uterine fibroid     Past Surgical History:  Procedure Laterality Date   APPENDECTOMY  2003   CESAREAN SECTION      DILATION AND CURETTAGE OF UTERUS     1 WEEK AGO   DILATION AND EVACUATION Bilateral 05/18/2016   Procedure: DILATATION AND EVACUATION;  Surgeon: Carrington Clamp, MD;  Location: [facility];  Service: Gynecology;  Laterality: Bilateral;   ECTOPIC PREGNANCY SURGERY     etopi     GASTRIC BYPASS  03/14/2012   INDUCED ABORTION     MYOMECTOMY  06/23/2020   OTHER SURGICAL HISTORY     surgical removal of nexplanon   OVARIAN CYST SURGERY      Allergies  Allergen Reactions   Fish Allergy Anaphylaxis   Other Swelling    Mayonnaise causes swelling of the tongue.   Penicillins Hives    Has patient had a PCN reaction causing immediate rash, facial/tongue/throat swelling, SOB or lightheadedness with hypotension: Yes Has patient had a PCN reaction causing severe rash involving mucus membranes or skin necrosis: No Has patient had a PCN reaction that required hospitalization No Has patient had a PCN reaction occurring within the last 10 years: Yes If all of the above answers are "NO", then may proceed with Cephalosporin use.     Shellfish Allergy Anaphylaxis    Patient is allergic to all  seafood.  She states she can take the dye that is given in CT scans even though she has some itching    Peanut Butter Flavor Hives   Histamine Other (See Comments)    Patient  tested positive in allergy testing to Control-histamine 1.   Voltaren [Diclofenac] Rash and Other (See Comments)    Volatren gel causes blisters.    ROS negative except as stated in HPI   Physical Exam: There were no vitals filed for this visit.  General: The patient is alert and oriented x3 in no acute distress. Ambulates unassisted. Patient is overweight.  Dermatology: Skin is warm, dry and supple bilateral lower extremities. Interspaces are clear of maceration and debris.  No open wounds or lesions appreciated  Vascular: Palpable pedal pulses bilaterally. Capillary refill within normal limits.  Mild edema to bilateral lower  extremities.  No erythema or calor.  Neurological: Light touch sensation grossly intact bilateral feet.   Musculoskeletal Exam: Mild pes planus foot type appreciated bilaterally.  Muscle strength 5/5 in dorsiflexion, plantarflexion, inversion, eversion.  Reports some pain with resisted plantarflexion bilaterally, eversion left lower extremity.  Left: Tenderness on palpation of plantar medial calcaneal tubercle.  Localized edema present.  Localized edema present around the peroneal tendons at the level of the ankle.  Tenderness on palpation at this area and moving towards the fifth met base styloid process.  Tenderness on palpation of posterior calcaneus at the level of Achilles insertion  Right: Tenderness on palpation of plantar medial calcaneal tubercle.  Localized edema present. Some referred pain to anterior ankle.  Radiographic Exam:  Deferred today I independently reviewed nonweightbearing left foot radiographs 10/05/2023 from the emergency department.  No evidence of acute fractures or acute osseous abnormalities.  Small enthesophyte formation noted to posterior calcaneus with enlarged posterior calcaneal tubercle.  Assessment/Plan of Care: 1. Plantar fasciitis, bilateral   2. Peroneal tendinitis of lower leg, left   3. Achilles tendinitis, left leg      Meds ordered this encounter  Medications   methylPREDNISolone (MEDROL DOSEPAK) 4 MG TBPK tablet    Sig: 6 Day Tapering Dose    Dispense:  21 tablet    Refill:  0   meloxicam (MOBIC) 15 MG tablet    Sig: Take 1 tablet (15 mg total) by mouth daily.    Dispense:  30 tablet    Refill:  0   triamcinolone acetonide (KENALOG) 10 MG/ML injection 10 mg   None  Discussed clinical findings with patient today.  # Bilateral plantar fasciitis -Discussed with patient etiology and mechanism of this condition.  Discussed that essentially this is an overuse injury which in some cases require immobilization physical therapy especially on  chronic settings -Suspect left lateral ankle pain/tendinitis has resulted has compensation -Consent obtained to perform plantar fascial injection to right heel.  Alcohol skin prep.  Point maximal tenderness injected with 0.5 cc 0.5% Marcaine plain, 0.5 cc lidocaine plain, 1 cc of Kenalog 10.  Band-Aid applied.  Patient tolerated this well. -Cam boot immobilization for left foot with heel lifts as described below. To be worn when ambulatory -Prescribed Medrol Dosepak followed by 30 days meloxicam for pain inflammation for this and the accompanying tendinitis -Achilles and plantar fascial stretching exercise regimen discussed with patient, written instructions dispensed. -Instructed patient to purchase over-the-counter power step orthotics to be worn in the right foot and the left foot once advanced out of cam boot. -Avoid barefoot ambulation  # Left peroneal tendinitis -Consent obtained to perform  injection along peroneal sheath lateral ankle.  Alcohol skin prep.  Injection performed with 0.5 cc 0.5% Marcaine plain, 0.5 cc lidocaine plain, 0.5 cc of Kenalog 10 -Cam boot immobilization as described above Medication, stretching, and progression to inserts as described above  # Left Achilles insertional tendinitis - Cam boot immobilization with heel lifts - May begin passive range of motion exercises as described -Medication as described above  Follow-up in 2 to 3 weeks for further evaluation   Jessina Marse L. Marchia Bond, AACFAS [facility]     [address] [place], [place] [address]                Office [phone]  Fax [phone]

## 2023-10-15 NOTE — Patient Instructions (Signed)

## 2023-10-26 ENCOUNTER — Telehealth: Payer: Self-pay | Admitting: Internal Medicine

## 2023-11-01 ENCOUNTER — Ambulatory Visit (HOSPITAL_COMMUNITY): Admission: RE | Admit: 2023-11-01 | Payer: Medicaid Other | Source: Ambulatory Visit

## 2023-11-05 ENCOUNTER — Ambulatory Visit: Payer: Medicaid Other | Admitting: Podiatry

## 2023-11-06 ENCOUNTER — Ambulatory Visit (HOSPITAL_COMMUNITY): Admission: RE | Admit: 2023-11-06 | Payer: Medicaid Other | Source: Ambulatory Visit

## 2023-11-13 ENCOUNTER — Telehealth: Payer: Self-pay | Admitting: *Deleted

## 2023-11-13 ENCOUNTER — Inpatient Hospital Stay: Payer: Medicaid Other | Admitting: Internal Medicine

## 2023-11-13 NOTE — Telephone Encounter (Signed)
 PC to patient, informed her she has an appointment today at 11:30 but her MRI has not been done.  Dr Buckley states patient may come in to discuss her HA's if she wants but ideally he would like the MRI done first.  Patient states she will schedule her brain MRI & then call to schedule with F/U with Dr Buckley.  Informed patient today's appointment will be canceled.  She verbalizes understanding.

## 2023-11-16 ENCOUNTER — Other Ambulatory Visit: Payer: Self-pay | Admitting: Podiatry

## 2023-11-16 DIAGNOSIS — M7672 Peroneal tendinitis, left leg: Secondary | ICD-10-CM

## 2023-11-16 DIAGNOSIS — M7662 Achilles tendinitis, left leg: Secondary | ICD-10-CM

## 2023-11-16 DIAGNOSIS — M722 Plantar fascial fibromatosis: Secondary | ICD-10-CM

## 2023-11-19 ENCOUNTER — Ambulatory Visit: Payer: Medicaid Other | Admitting: Podiatry

## 2023-11-19 DIAGNOSIS — M722 Plantar fascial fibromatosis: Secondary | ICD-10-CM

## 2023-11-19 DIAGNOSIS — M7672 Peroneal tendinitis, left leg: Secondary | ICD-10-CM

## 2023-11-19 DIAGNOSIS — M7661 Achilles tendinitis, right leg: Secondary | ICD-10-CM

## 2023-11-19 DIAGNOSIS — M7662 Achilles tendinitis, left leg: Secondary | ICD-10-CM

## 2023-11-19 MED ORDER — MELOXICAM 15 MG PO TABS: 15.0000 mg | ORAL_TABLET | Freq: Every day | ORAL | 0 refills | Status: DC

## 2023-11-19 MED ORDER — PREDNISONE 10 MG PO TABS: ORAL_TABLET | ORAL | 0 refills | Status: AC

## 2023-11-19 NOTE — Progress Notes (Signed)
 Chief Complaint  Patient presents with   Plantar Fasciitis    Lt PF, She states that it is her fault that it is not as healed as it could be, she does not wear boot as much as she needs to. The boot does help, she just doesn't wear it as much.      HPI: 38 y.o. female presents today for bilateral plantar fasciitis, left achilles tendinitis and peroneal pain. States right posterior heel and outside of her foot are hurting today. Patient states that she has not been using the boot frequently and has not been able to keep up with the stretching regimen completely. States she has had only mild improvement to the original sites and has pain bilaterally. Left side seems to be more painful today than right. She does not want another injection today. Has history of DVT and PE.  Patient denies nausea, vomiting, fever, chills Past Medical History:  Diagnosis Date   Allergic rhinitis    Allergy to cats    Allergy to dogs    Anemia    Anxiety    Asthma    BMI 37.0-37.9, adult    Brain tumor (benign) (HCC)    Breast cyst    Chronic pain syndrome    Deep vein thrombosis (DVT) (HCC)    DVT (deep venous thrombosis) (HCC)    DVT (deep venous thrombosis) (HCC)    Endometriosis, uterus    Environmental allergies    Fibromyalgia syndrome    GERD (gastroesophageal reflux disease)    Headache    lesion on brain that causes migraines   Hip discomfort    IBS (irritable bowel syndrome)    with diarrhea   Neuropathy    in left leg lower due to vein problem   Obesity    Ovarian cyst    Paroxysmal dyspnea    PTSD (post-traumatic stress disorder)    Patient reported   Uterine fibroid     Past Surgical History:  Procedure Laterality Date   APPENDECTOMY  2003   CESAREAN SECTION     DILATION AND CURETTAGE OF UTERUS     1 WEEK AGO   DILATION AND EVACUATION Bilateral 05/18/2016   Procedure: DILATATION AND EVACUATION;  Surgeon: Rosaline Luna, MD;  Location: [facility];  Service: Gynecology;   Laterality: Bilateral;   ECTOPIC PREGNANCY SURGERY     etopi     GASTRIC BYPASS  03/14/2012   INDUCED ABORTION     MYOMECTOMY  06/23/2020   OTHER SURGICAL HISTORY     surgical removal of nexplanon   OVARIAN CYST SURGERY      Allergies  Allergen Reactions   Fish Allergy Anaphylaxis   Other Swelling    Mayonnaise causes swelling of the tongue.   Penicillins Hives    Has patient had a PCN reaction causing immediate rash, facial/tongue/throat swelling, SOB or lightheadedness with hypotension: Yes Has patient had a PCN reaction causing severe rash involving mucus membranes or skin necrosis: No Has patient had a PCN reaction that required hospitalization No Has patient had a PCN reaction occurring within the last 10 years: Yes If all of the above answers are NO, then may proceed with Cephalosporin use.     Shellfish Allergy Anaphylaxis    Patient is allergic to all seafood.  She states she can take the dye that is given in CT scans even though she has some itching    Peanut Butter Flavoring Agent (Non-Screening) Hives  Histamine Other (See Comments)    Patient  tested positive in allergy testing to Control-histamine 1.   Voltaren [Diclofenac] Rash and Other (See Comments)    Volatren gel causes blisters.    ROS negative except as stated in HPI   Physical Exam: There were no vitals filed for this visit.  General: The patient is alert and oriented x3 in no acute distress. Ambulates unassisted. Patient is overweight.  Dermatology: Skin is warm, dry and supple bilateral lower extremities. Interspaces are clear of maceration and debris.  No open wounds or lesions appreciated  Vascular: Palpable pedal pulses bilaterally. Capillary refill within normal limits.  Mild edema to bilateral lower extremities.  No erythema or calor.  Neurological: Light touch sensation grossly intact bilateral feet.   Musculoskeletal Exam: Mild pes planus foot type appreciated bilaterally.  Muscle  strength 5/5 in dorsiflexion, plantarflexion, inversion, eversion.  Reports some pain with resisted plantarflexion bilaterally, eversion left lower extremity.  Left: Tenderness on palpation of plantar medial calcaneal tubercle.  Localized edema present.  Localized edema present around the peroneal tendons at the level of the ankle.  Tenderness on palpation at this area and moving towards the fifth met base styloid process.  Tenderness on palpation of posterior calcaneus at the level of Achilles insertion  Right: Tenderness on palpation of plantar medial calcaneal tubercle.  There is now right achilles pain present and peroneal tendon pain present in similar distribution to the left side.  Radiographic Exam:  Deferred today I independently reviewed nonweightbearing left foot radiographs 10/05/2023 from the emergency department.  No evidence of acute fractures or acute osseous abnormalities.  Small enthesophyte formation noted to posterior calcaneus with enlarged posterior calcaneal tubercle.  Assessment/Plan of Care: 1. Plantar fasciitis, bilateral   2. Achilles tendinitis, right leg   3. Peroneal tendinitis of lower leg, left   4. Achilles tendinitis, left leg      Meds ordered this encounter  Medications   predniSONE  (DELTASONE ) 10 MG tablet    Sig: Take 6 tablets (60 mg total) by mouth daily with breakfast for 2 days, THEN 5 tablets (50 mg total) daily with breakfast for 2 days, THEN 4 tablets (40 mg total) daily with breakfast for 2 days, THEN 3 tablets (30 mg total) daily with breakfast for 2 days, THEN 2 tablets (20 mg total) daily with breakfast for 2 days, THEN 1 tablet (10 mg total) daily with breakfast for 2 days. 12 Day tapering Dose. Take in AM with breakfast.    Dispense:  42 tablet    Refill:  0   meloxicam  (MOBIC ) 15 MG tablet    Sig: Take 1 tablet (15 mg total) by mouth daily.    Dispense:  30 tablet    Refill:  0   None  Discussed clinical findings with patient  today.  # Bilateral plantar fasciitis, Bilateral Achilles tendinitis, Compensatory peroneal tenditis. -Discussed with patient etiology and mechanism of this condition.  Discussed that essentially this is an overuse injury which in some cases require immobilization physical therapy especially on chronic settings - Emphasized the importance of cam boot immobilization.  Will focus on the left foot for now with 2 felt heel lifts in place.  Dispensed heel lift for right foot -Instructed the patient to purchase an even up for the right foot as well to help with cam boot ambulation.  Did discuss that she should limit activities much as possible -Recommend the use of night splints bilaterally if possible, if she can only  tolerate 1 side, use on the right side as the left foot is in the cam boot all day -12-day steroid taper prescribed due to diffuse nature of the pain and inflammation - Follow-up with course of oral meloxicam  - Stretching and icing regimen discussed with patient, instructions dispensed as well - Did discuss the importance of good supportive shoes which can be helpful for the contralateral side. -Compressive anklets x 2 were dispensed for generalized edema around the ankles and plantar fascial arch pad sleeve for the right foot to be worn with regular shoe gear.  Follow-up in 3 weeks for further evaluation   Finnlee Silvernail L. Lamount MAUL, AACFAS [facility]     [address] [place], [place] [address]                Office [phone]  Fax [phone]

## 2023-11-19 NOTE — Patient Instructions (Addendum)
 Look for an EvenUp shoe attachment on [employer] or at [employer]. This will level out your hips while you are walking in the CAM boot. Wear this on the other foot around a supportive sneaker:     More silicone pads can be purchased from:  [email or web address].[email or web address]   Recommend [place], [place], [employer], New Balance for good supportive shoes.    Plantar Fasciitis (Heel Spur Syndrome) with Rehab The plantar fascia is a fibrous, ligament-like, soft-tissue structure that spans the bottom of the foot. Plantar fasciitis is a condition that causes pain in the foot due to inflammation of the tissue. SYMPTOMS  Pain and tenderness on the underneath side of the foot. Pain that worsens with standing or walking. CAUSES  Plantar fasciitis is caused by irritation and injury to the plantar fascia on the underneath side of the foot. Common mechanisms of injury include: Direct trauma to bottom of the foot. Damage to a small nerve that runs under the foot where the main fascia attaches to the heel bone. Stress placed on the plantar fascia due to bone spurs. RISK INCREASES WITH:  Activities that place stress on the plantar fascia (running, jumping, pivoting, or cutting). Poor strength and flexibility. Improperly fitted shoes. Tight calf muscles. Flat feet. Failure to warm-up properly before activity. Obesity. PREVENTION Warm up and stretch properly before activity. Allow for adequate recovery between workouts. Maintain physical fitness: Strength, flexibility, and endurance. Cardiovascular fitness. Maintain a health body weight. Avoid stress on the plantar fascia. Wear properly fitted shoes, including arch supports for individuals who have flat feet.  PROGNOSIS  If treated properly, then the symptoms of plantar fasciitis usually resolve without surgery. However, occasionally surgery is necessary.  RELATED COMPLICATIONS  Recurrent symptoms that may result in a chronic condition. Problems of  the lower back that are caused by compensating for the injury, such as limping. Pain or weakness of the foot during push-off following surgery. Chronic inflammation, scarring, and partial or complete fascia tear, occurring more often from repeated injections.  TREATMENT  Treatment initially involves the use of ice and medication to help reduce pain and inflammation. The use of strengthening and stretching exercises may help reduce pain with activity, especially stretches of the Achilles tendon. These exercises may be performed at home or with a therapist. Your caregiver may recommend that you use heel cups of arch supports to help reduce stress on the plantar fascia. Occasionally, corticosteroid injections are given to reduce inflammation. If symptoms persist for greater than 6 months despite non-surgical (conservative), then surgery may be recommended.   MEDICATION  If pain medication is necessary, then nonsteroidal anti-inflammatory medications, such as aspirin and ibuprofen , or other minor pain relievers, such as acetaminophen , are often recommended. Do not take pain medication within 7 days before surgery. Prescription pain relievers may be given if deemed necessary by your caregiver. Use only as directed and only as much as you need. Corticosteroid injections may be given by your caregiver. These injections should be reserved for the most serious cases, because they may only be given a certain number of times.  HEAT AND COLD Cold treatment (icing) relieves pain and reduces inflammation. Cold treatment should be applied for 10 to 15 minutes every 2 to 3 hours for inflammation and pain and immediately after any activity that aggravates your symptoms. Use ice packs or massage the area with a piece of ice (ice massage). Heat treatment may be used prior to performing the stretching and strengthening activities prescribed by your caregiver, physical therapist, or  event organiser. Use a heat pack or  soak the injury in warm water.  SEEK IMMEDIATE MEDICAL CARE IF: Treatment seems to offer no benefit, or the condition worsens. Any medications produce adverse side effects.  EXERCISES- RANGE OF MOTION (ROM) AND STRETCHING EXERCISES - Plantar Fasciitis (Heel Spur Syndrome) These exercises may help you when beginning to rehabilitate your injury. Your symptoms may resolve with or without further involvement from your physician, physical therapist or athletic trainer. While completing these exercises, remember:  Restoring tissue flexibility helps normal motion to return to the joints. This allows healthier, less painful movement and activity. An effective stretch should be held for at least 30 seconds. A stretch should never be painful. You should only feel a gentle lengthening or release in the stretched tissue.  RANGE OF MOTION - Toe Extension, Flexion Sit with your right / left leg crossed over your opposite knee. Grasp your toes and gently pull them back toward the top of your foot. You should feel a stretch on the bottom of your toes and/or foot. Hold this stretch for 10 seconds. Now, gently pull your toes toward the bottom of your foot. You should feel a stretch on the top of your toes and or foot. Hold this stretch for 10 seconds. Repeat  times. Complete this stretch 3 times per day.   RANGE OF MOTION - Ankle Dorsiflexion, Active Assisted Remove shoes and sit on a chair that is preferably not on a carpeted surface. Place right / left foot under knee. Extend your opposite leg for support. Keeping your heel down, slide your right / left foot back toward the chair until you feel a stretch at your ankle or calf. If you do not feel a stretch, slide your bottom forward to the edge of the chair, while still keeping your heel down. Hold this stretch for 10 seconds. Repeat 3 times. Complete this stretch 2 times per day.   STRETCH  Gastroc, Standing Place hands on wall. Extend right / left  leg, keeping the front knee somewhat bent. Slightly point your toes inward on your back foot. Keeping your right / left heel on the floor and your knee straight, shift your weight toward the wall, not allowing your back to arch. You should feel a gentle stretch in the right / left calf. Hold this position for 10 seconds. Repeat 3 times. Complete this stretch 2 times per day.  STRETCH  Soleus, Standing Place hands on wall. Extend right / left leg, keeping the other knee somewhat bent. Slightly point your toes inward on your back foot. Keep your right / left heel on the floor, bend your back knee, and slightly shift your weight over the back leg so that you feel a gentle stretch deep in your back calf. Hold this position for 10 seconds. Repeat 3 times. Complete this stretch 2 times per day.  STRETCH  Gastrocsoleus, Standing  Note: This exercise can place a lot of stress on your foot and ankle. Please complete this exercise only if specifically instructed by your caregiver.  Place the ball of your right / left foot on a step, keeping your other foot firmly on the same step. Hold on to the wall or a rail for balance. Slowly lift your other foot, allowing your body weight to press your heel down over the edge of the step. You should feel a stretch in your right / left calf. Hold this position for 10 seconds. Repeat this exercise with a slight bend in  your right / left knee. Repeat 3 times. Complete this stretch 2 times per day.   STRENGTHENING EXERCISES - Plantar Fasciitis (Heel Spur Syndrome)  These exercises may help you when beginning to rehabilitate your injury. They may resolve your symptoms with or without further involvement from your physician, physical therapist or athletic trainer. While completing these exercises, remember:  Muscles can gain both the endurance and the strength needed for everyday activities through controlled exercises. Complete these exercises as instructed by your  physician, physical therapist or athletic trainer. Progress the resistance and repetitions only as guided.  STRENGTH - Towel Curls Sit in a chair positioned on a non-carpeted surface. Place your foot on a towel, keeping your heel on the floor. Pull the towel toward your heel by only curling your toes. Keep your heel on the floor. Repeat 3 times. Complete this exercise 2 times per day.  STRENGTH - Ankle Inversion Secure one end of a rubber exercise band/tubing to a fixed object (table, pole). Loop the other end around your foot just before your toes. Place your fists between your knees. This will focus your strengthening at your ankle. Slowly, pull your big toe up and in, making sure the band/tubing is positioned to resist the entire motion. Hold this position for 10 seconds. Have your muscles resist the band/tubing as it slowly pulls your foot back to the starting position. Repeat 3 times. Complete this exercises 2 times per day.  Document Released: 10/30/2005 Document Revised: 01/22/2012 Document Reviewed: 02/11/2009 [facility] Patient Information 2014 [place], [place].

## 2023-11-21 ENCOUNTER — Encounter: Payer: Self-pay | Admitting: Podiatry

## 2023-11-27 ENCOUNTER — Ambulatory Visit (HOSPITAL_COMMUNITY)
Admission: RE | Admit: 2023-11-27 | Discharge: 2023-11-27 | Disposition: A | Discharge status: Home / Self Care | Payer: Medicaid Other | Source: Ambulatory Visit | Attending: Internal Medicine | Admitting: Internal Medicine

## 2023-11-27 DIAGNOSIS — R9089 Other abnormal findings on diagnostic imaging of central nervous system: Secondary | ICD-10-CM | POA: Diagnosis present

## 2023-11-27 MED ORDER — GADOBUTROL 1 MMOL/ML IV SOLN
9.0000 mL | Freq: Once | INTRAVENOUS | Status: AC | PRN
  Administered 2023-11-27: 9 mL via INTRAVENOUS

## 2023-12-10 ENCOUNTER — Encounter: Payer: Self-pay | Admitting: Podiatry

## 2023-12-10 ENCOUNTER — Ambulatory Visit: Payer: Medicaid Other | Admitting: Podiatry

## 2023-12-10 DIAGNOSIS — G5753 Tarsal tunnel syndrome, bilateral lower limbs: Secondary | ICD-10-CM

## 2023-12-10 DIAGNOSIS — M7989 Other specified soft tissue disorders: Secondary | ICD-10-CM | POA: Diagnosis not present

## 2023-12-10 DIAGNOSIS — M7672 Peroneal tendinitis, left leg: Secondary | ICD-10-CM

## 2023-12-10 DIAGNOSIS — M722 Plantar fascial fibromatosis: Secondary | ICD-10-CM | POA: Diagnosis not present

## 2023-12-10 DIAGNOSIS — M5432 Sciatica, left side: Secondary | ICD-10-CM

## 2023-12-10 NOTE — Progress Notes (Unsigned)
Chief Complaint  Patient presents with   Plantar Fasciitis    Bilat PF , Boot was on the left today. Left foot is a lot worse with swelling, discoloration at knee where swelling was bad too. Walking in a "night mare" . Pain is the worst on the medial- mid/hindfoot. It is burning as well as the pain. There is bruise like discolor on shin to knee with tingling in the toes.     HPI: 38 y.o. female presents today for bilateral plantar fasciitis, left achilles tendinitis and peroneal pain.  She states that she has been trying to use the boot to the left side more.  She states that her pain has worsened.  She complains of shooting burning tingling pain going all the way down her leg left greater than right.  She also reports worsening leg swelling left side.  States that a couple days ago she did notice worsening swelling and bruising along the left instep and ankle.  Has history of DVT and PE.  Patient denies nausea, vomiting, fever, chills, shortness of breath Past Medical History:  Diagnosis Date   Allergic rhinitis    Allergy to cats    Allergy to dogs    Anemia    Anxiety    Asthma    BMI 37.0-37.9, adult    Brain tumor (benign) (HCC)    Breast cyst    Chronic pain syndrome    Deep vein thrombosis (DVT) (HCC)    DVT (deep venous thrombosis) (HCC)    DVT (deep venous thrombosis) (HCC)    Endometriosis, uterus    Environmental allergies    Fibromyalgia syndrome    GERD (gastroesophageal reflux disease)    Headache    lesion on brain that causes migraines   Hip discomfort    IBS (irritable bowel syndrome)    with diarrhea   Neuropathy    in left leg lower due to vein problem   Obesity    Ovarian cyst    Paroxysmal dyspnea    PTSD (post-traumatic stress disorder)    Patient reported   Uterine fibroid     Past Surgical History:  Procedure Laterality Date   APPENDECTOMY  2003   CESAREAN SECTION     DILATION AND CURETTAGE OF UTERUS     1 WEEK AGO   DILATION AND  EVACUATION Bilateral 05/18/2016   Procedure: DILATATION AND EVACUATION;  Surgeon: Carrington Clamp, MD;  Location: [facility];  Service: Gynecology;  Laterality: Bilateral;   ECTOPIC PREGNANCY SURGERY     etopi     GASTRIC BYPASS  03/14/2012   INDUCED ABORTION     MYOMECTOMY  06/23/2020   OTHER SURGICAL HISTORY     surgical removal of nexplanon   OVARIAN CYST SURGERY      Allergies  Allergen Reactions   Fish Allergy Anaphylaxis   Other Swelling    Mayonnaise causes swelling of the tongue.   Penicillins Hives    Has patient had a PCN reaction causing immediate rash, facial/tongue/throat swelling, SOB or lightheadedness with hypotension: Yes Has patient had a PCN reaction causing severe rash involving mucus membranes or skin necrosis: No Has patient had a PCN reaction that required hospitalization No Has patient had a PCN reaction occurring within the last 10 years: Yes If all of the above answers are "NO", then may proceed with Cephalosporin use.     Shellfish Allergy Anaphylaxis    Patient is allergic to all seafood.  She states  she can take the dye that is given in CT scans even though she has some itching    Peanut Butter Flavoring Agent (Non-Screening) Hives   Histamine Other (See Comments)    Patient  tested positive in allergy testing to Control-histamine 1.   Voltaren [Diclofenac] Rash and Other (See Comments)    Volatren gel causes blisters.    ROS negative except as stated in HPI   Physical Exam: There were no vitals filed for this visit.  General: The patient is alert and oriented x3 in no acute distress. Ambulates unassisted. Patient is overweight.  Dermatology: Skin is warm, dry and supple bilateral lower extremities. Interspaces are clear of maceration and debris.  No open wounds or lesions appreciated  Vascular: Palpable pedal pulses bilaterally. Capillary refill within normal limits.  Mildly increased edema left greater than right.  No erythema or calor.   Positive Homans' sign and pain with posterior calf squeeze left side.  Neurological: Light touch sensation grossly intact bilateral feet.  Positive Tinel sign bilaterally left greater than right.  Musculoskeletal Exam: Mild pes planus foot type appreciated bilaterally.  Muscle strength 5/5 in dorsiflexion, plantarflexion, inversion, eversion.  Reports some pain with resisted plantarflexion bilaterally, eversion left lower extremity.  Left: Tenderness on palpation of plantar medial calcaneal tubercle.  Localized edema present.  Localized edema present around the peroneal tendons at the level of the ankle.  Tenderness on palpation at this area and moving towards the fifth met base styloid process.  Tenderness on palpation of posterior calcaneus at the level of Achilles insertion  Right: Tenderness on palpation of plantar medial calcaneal tubercle.  There is now right achilles pain present and peroneal tendon pain present in similar distribution to the left side.  Radiographic Exam:  Deferred today I independently reviewed nonweightbearing left foot radiographs 10/05/2023 from the emergency department.  No evidence of acute fractures or acute osseous abnormalities.  Small enthesophyte formation noted to posterior calcaneus with enlarged posterior calcaneal tubercle.  Assessment/Plan of Care: 1. Plantar fasciitis, bilateral   2. Peroneal tendinitis of lower leg, left   3. Tarsal tunnel syndrome of both lower extremities   4. Leg swelling   5. Sciatica of left side      No orders of the defined types were placed in this encounter.  AMB REFERRAL TO SPINE SURGERY US VENOUS IMG LOWER UNI LEFT (DVT) MR FOOT LEFT WO CONTRAST  Discussed clinical findings with patient today.  # Bilateral plantar fasciitis, Bilateral Achilles tendinitis, Compensatory peroneal tenditis. -Patient has not had significant improvement, and in fact reports that the left side has gotten noticeably worse - Deferring  further medicate patient or injections for now as she has emerging other issues that require further workup. -Ordering MRI of the left lower extremity to evaluate extent of peroneal and PT tendon pathology, plantar fasciitis.  Also concern for tarsal tunnel syndrome bilaterally. - Discontinue the use of the cam walker boot left side as it appears to have worsened her overall left foot pain.  Did discuss the importance of good supportive shoes which can be helpful for the contralateral side. -Continue the use of compressive anklets.  # Tarsal tunnel syndrome bilateral versus sciatica pain -MRI ordered to evaluate for tarsal tunnel in addition to above pathology -Patient does report history of low back pain and symptoms which do appear consistent with sciatica.  Placing referral to back specialist as this could be contributing to her overall presentation.  # Left lower extremity swelling -Patient has  history of DVT and PE.  I have urged patient to go to ED for venous ultrasound.  I have placed a stat order for venous ultrasound in the event that the patient cannot get this done. -Instructed patient that she needs to go to the emergency room immediately if she experiences fever, chills, shortness of breath, worsening leg swelling.  Follow up after MRI has been obtained.   Nina Wood, AACFAS [facility]     [address] [place], [place] [address]                Office [phone]  Fax [phone]

## 2023-12-11 ENCOUNTER — Encounter: Payer: Self-pay | Admitting: Internal Medicine

## 2023-12-11 ENCOUNTER — Telehealth: Payer: Self-pay | Admitting: Internal Medicine

## 2023-12-11 NOTE — Telephone Encounter (Signed)
Scheduled appointment per 1/28 scheduling message. Patient was made aware via voicemail and will be mailed an appointment reminder.

## 2023-12-13 ENCOUNTER — Encounter: Payer: Self-pay | Admitting: Podiatry

## 2023-12-17 ENCOUNTER — Other Ambulatory Visit: Payer: Self-pay | Admitting: Podiatry

## 2023-12-17 ENCOUNTER — Other Ambulatory Visit: Payer: Medicaid Other

## 2023-12-17 ENCOUNTER — Encounter: Payer: Self-pay | Admitting: Podiatry

## 2023-12-17 DIAGNOSIS — M7662 Achilles tendinitis, left leg: Secondary | ICD-10-CM

## 2023-12-17 DIAGNOSIS — M722 Plantar fascial fibromatosis: Secondary | ICD-10-CM

## 2023-12-17 DIAGNOSIS — M7661 Achilles tendinitis, right leg: Secondary | ICD-10-CM

## 2023-12-18 ENCOUNTER — Other Ambulatory Visit: Payer: Self-pay | Admitting: Podiatry

## 2023-12-18 DIAGNOSIS — M7661 Achilles tendinitis, right leg: Secondary | ICD-10-CM

## 2023-12-18 DIAGNOSIS — M722 Plantar fascial fibromatosis: Secondary | ICD-10-CM

## 2023-12-18 NOTE — Progress Notes (Signed)
MR ordered for Right lower extremity as well due to concomitant issues

## 2023-12-19 ENCOUNTER — Other Ambulatory Visit: Payer: Medicaid Other

## 2023-12-19 ENCOUNTER — Ambulatory Visit
Admission: RE | Admit: 2023-12-19 | Discharge: 2023-12-19 | Disposition: A | Discharge status: Home / Self Care | Payer: Medicaid Other | Source: Ambulatory Visit | Attending: Podiatry | Admitting: Podiatry

## 2023-12-19 DIAGNOSIS — M7989 Other specified soft tissue disorders: Secondary | ICD-10-CM

## 2023-12-20 ENCOUNTER — Inpatient Hospital Stay: Admission: RE | Admit: 2023-12-20 | Payer: Medicaid Other | Source: Ambulatory Visit

## 2023-12-21 ENCOUNTER — Other Ambulatory Visit: Payer: Self-pay | Admitting: Podiatry

## 2023-12-21 ENCOUNTER — Telehealth: Payer: Self-pay | Admitting: Podiatry

## 2023-12-21 NOTE — Telephone Encounter (Signed)
 [facility] called stating that they have submitted orders for the L/R foot, but have only received authorization from ins for the Left foot. They will have the imaging done for the Left and once it is complete, they will submit orders for the Right. When they tried to submit both, the second order would not go through since the first was still pending.

## 2023-12-22 ENCOUNTER — Other Ambulatory Visit: Payer: Medicaid Other

## 2023-12-25 NOTE — Progress Notes (Unsigned)
Referring Physician:  Barbaraann Share, DPM [address] [place],  [place] [address]  Primary Physician:  Simone Curia, MD  History of Present Illness: 12/26/2023 Ms. Nina Wood has a history of migraines, neuropathy in left leg, chronic pain, DVT, PE, FM, GERD, IBS, obesity, and PTSD.   She has history of gastric bypass.   She had left leg [place] on 12/19/23 that was negative for DVT. She is scheduled for MRIs of both feet. Seeing podiatry for bilateral plantar fasciitis, achilles and peroneal tendonitis.   Referred by podiatry for back and leg pain.   She has intermittent LBP with bilateral intermittent leg pain, left > right leg since having twins in September 2023. She has burning pain in her medial ankle that radiates up to her buttocks. She has numbness, tingling, and weakness in her legs. Pain is worse with any movement. Some improvement with rest, heat, and ice.   She thinks boot is making her pain worse. Has used walker and cane in the past.   She is taking mobic, prn lyrica, prn ultram, prn norco 7.5.    She does not smoke.   Bowel/Bladder Dysfunction: she has urinary urgency. No bowel/bladder incontinence.   Conservative measures:  Physical therapy:  has not participated in Multimodal medical therapy including regular antiinflammatories: meloxicam, lyrica, tramadol, hydrocodone Injections: no epidural steroid injections  Past Surgery: no spinal surgeries  MELODYE SWOR has no symptoms of cervical myelopathy.  The symptoms are causing a significant impact on the patient's life.   Review of Systems:  A 10 point review of systems is negative, except for the pertinent positives and negatives detailed in the HPI.  Past Medical History: Past Medical History:  Diagnosis Date   Allergic rhinitis    Allergy to cats    Allergy to dogs    Anemia    Anxiety    Asthma    BMI 37.0-37.9, adult    Brain tumor (benign) (HCC)    Breast cyst    Chronic pain syndrome     Deep vein thrombosis (DVT) (HCC)    DVT (deep venous thrombosis) (HCC)    DVT (deep venous thrombosis) (HCC)    Endometriosis, uterus    Environmental allergies    Fibromyalgia syndrome    GERD (gastroesophageal reflux disease)    Headache    lesion on brain that causes migraines   Hip discomfort    IBS (irritable bowel syndrome)    with diarrhea   Neuropathy    in left leg lower due to vein problem   Obesity    Ovarian cyst    Paroxysmal dyspnea    PTSD (post-traumatic stress disorder)    Patient reported   Uterine fibroid     Past Surgical History: Past Surgical History:  Procedure Laterality Date   APPENDECTOMY  2003   CESAREAN SECTION     DILATION AND CURETTAGE OF UTERUS     1 WEEK AGO   DILATION AND EVACUATION Bilateral 05/18/2016   Procedure: DILATATION AND EVACUATION;  Surgeon: Carrington Clamp, MD;  Location: [facility];  Service: Gynecology;  Laterality: Bilateral;   ECTOPIC PREGNANCY SURGERY     etopi     GASTRIC BYPASS  03/14/2012   INDUCED ABORTION     MYOMECTOMY  06/23/2020   OTHER SURGICAL HISTORY     surgical removal of nexplanon   OVARIAN CYST SURGERY      Allergies: Allergies as of 12/26/2023 - Review Complete 12/26/2023  Allergen  Reaction Noted   Fish allergy Anaphylaxis 02/24/2017   Other Swelling 04/27/2015   Penicillins Hives 04/27/2015   Shellfish allergy Anaphylaxis 04/27/2015   Peanut butter flavoring agent (non-screening) Hives 04/27/2015   Cat dander  04/12/2022   Dog epithelium (canis lupus familiaris)  04/12/2022   Histamine Other (See Comments) 04/27/2015   Voltaren [diclofenac] Rash and Other (See Comments) 04/27/2015    Medications: Outpatient Encounter Medications as of 12/26/2023  Medication Sig   Albuterol Sulfate, sensor, (PROAIR DIGIHALER) 108 (90 Base) MCG/ACT AEPB Proair Digihaler 90 mcg/actuation aerosol powder breath act, sensor  INHALE 2 PUFFS EVERY 4 HOURS AS NEEDED.   cetirizine (ZYRTEC) 10 MG tablet Take 20-30 mg by  mouth at bedtime.    Doxylamine-Pyridoxine 10-10 MG TBEC Take 2 tablets by mouth daily.   enoxaparin (LOVENOX) 40 MG/0.4ML injection    EPINEPHRINE 0.3 mg/0.3 mL IJ SOAJ injection USE AS DIRECTED FOR LIFE THREATENING ALLERGIC REACTIONS   ferrous sulfate 325 (65 FE) MG EC tablet Take 1 tablet (325 mg total) by mouth daily with breakfast.   Fremanezumab-vfrm (AJOVY) 225 MG/1.5ML SOSY INJECT 225 MG UNDER THE SKIN EVERY 30 DAYS   HYDROcodone-acetaminophen (NORCO) 7.5-325 MG tablet Take 1 tablet by mouth every 8 (eight) hours as needed.   Hyprom-Naphaz-Polysorb-Zn Sulf (CLEAR EYES COMPLETE OP) Place 1 drop into both eyes 2 (two) times daily as needed (dry eyes/itching).    meloxicam (MOBIC) 15 MG tablet Take 1 tablet (15 mg total) by mouth daily.   meloxicam (MOBIC) 15 MG tablet TAKE 1 TABLET(15 MG) BY MOUTH DAILY   montelukast (SINGULAIR) 10 MG tablet TAKE 1 TABLET BY MOUTH EVERY DAY   omeprazole (PRILOSEC) 20 MG capsule Take 1 capsule (20 mg total) by mouth daily.   pregabalin (LYRICA) 75 MG capsule Take 1 capsule (75 mg total) by mouth 2 (two) times daily.   Prenatal Vit-Fe Fumarate-FA (PRENATAL VITAMINS PO) Take by mouth.   traMADol (ULTRAM) 50 MG tablet Take 50 mg by mouth 2 (two) times daily as needed.   triamcinolone cream (KENALOG) 0.5 % Apply 1 application topically every 4 (four) hours as needed (rash/allergies).    No facility-administered encounter medications on file as of 12/26/2023.    Social History: Social History   Tobacco Use   Smoking status: Never   Smokeless tobacco: Never  Vaping Use   Vaping status: Never Used  Substance Use Topics   Alcohol use: Not Currently   Drug use: Not Currently    Types: Marijuana    Comment: 09/23/20-states "about a week ago"    Family Medical History: Family History  Problem Relation Age of Onset   Hypertension Mother    Breast cancer Mother    Prostate cancer Father    Hypertension Maternal Uncle    Diabetes Paternal Aunt     Cancer Paternal Aunt    Cancer Maternal Grandmother    Diabetes Sister    Neuropathy Neg Hx        none pt is aware of    Physical Examination: Vitals:   12/26/23 1014  BP: 128/78    General: Patient is well developed, well nourished, calm, collected, and in no apparent distress. Attention to examination is appropriate.  Respiratory: Patient is breathing without any difficulty.   NEUROLOGICAL:     Awake, alert, oriented to person, place, and time.  Speech is clear and fluent. Fund of knowledge is appropriate.   Cranial Nerves: Pupils equal round and reactive to light.  Facial tone is symmetric.  Mild lower posterior lumbar tenderness.   No abnormal lesions on exposed skin.   Strength: Side Biceps Triceps Deltoid Interossei Grip Wrist Ext. Wrist Flex.  R 5 5 5 5 5 5 5   L 5 5 5 5 5 5 5    Side Iliopsoas Quads Hamstring PF DF EHL  R 5 5 5 5 5 5   L 5 5 5 5  --- ---   Reflexes are 2+ and symmetric at the biceps, brachioradialis, patella and achilles. Unable to test achilles on left as she is wearing a boot.   Hoffman's is absent.  Clonus is not present right LE, unable to test in left LE due to boot.   Bilateral upper and lower extremity sensation is intact to light touch. Limited exam on left due to boot.   She has abnormal gait- she limps.   Medical Decision Making  Imaging: No lumbar imaging.   Assessment and Plan: Ms. Cookston has intermittent LBP with bilateral intermittent leg pain, left > right leg that started after having twins in September 2023. She has burning pain in her medial ankle that radiates up to her buttocks in both legs. She has numbness, tingling, and weakness in her legs.   She was on a walker after giving birth, then a cane. She is now in a boot on left foot from podiatry.   No lumbar imaging. LBP is likely lumbar mediated. Leg pain may be lumbar mediated.   Treatment options discussed with patient and following plan made:   - MRI of lumbar  spine to further evaluate lumbar radiculopathy. No improvement with time or medications (lyrica, mobic, ultram, norco).  - Depending on results of MRI, may consider PT and/or injections. May also consider EMG of lower extremities.  - One of her twins needs 24 hour care, so PT may be difficult for her to do.  - Will schedule phone visit to review MRI results once I get them back.   She also is scheduled for bilateral foot MRI scans as well.   I spent a total of 30 minutes in face-to-face and non-face-to-face activities related to this patient's care today including review of outside records, review of imaging, review of symptoms, physical exam, discussion of differential diagnosis, discussion of treatment options, and documentation.   Thank you for involving me in the care of this patient.   Drake Leach PA-C Dept. of Neurosurgery

## 2023-12-26 ENCOUNTER — Encounter: Payer: Self-pay | Admitting: Orthopedic Surgery

## 2023-12-26 ENCOUNTER — Ambulatory Visit: Payer: Medicaid Other | Admitting: Orthopedic Surgery

## 2023-12-26 VITALS — BP 128/78 | Ht <= 58 in | Wt 210.0 lb

## 2023-12-26 DIAGNOSIS — M5442 Lumbago with sciatica, left side: Secondary | ICD-10-CM | POA: Diagnosis not present

## 2023-12-26 DIAGNOSIS — G8929 Other chronic pain: Secondary | ICD-10-CM

## 2023-12-26 DIAGNOSIS — M5416 Radiculopathy, lumbar region: Secondary | ICD-10-CM

## 2023-12-26 DIAGNOSIS — M5441 Lumbago with sciatica, right side: Secondary | ICD-10-CM | POA: Diagnosis not present

## 2023-12-26 NOTE — Patient Instructions (Signed)
It was so nice to see you today. Thank you so much for coming in.    The pain in your back and legs may be coming from your lower back.   I want to get an MRI of your lower back to look into things further. We will get this approved through your insurance and [facility] will call you to schedule the appointment.   After you have the MRI, it takes 14-21 days for me to get the results back. Once I have them, we will call you to schedule a follow up phone visit with me to review them.   Please do not hesitate to call if you have any questions or concerns. You can also message me in MyChart.   Drake Leach PA-C [phone]     The physicians and staff at [facility] are committed to providing excellent care. You may receive a survey asking for feedback about your experience at our office. We value you your feedback and appreciate you taking the time to to fill it out. The [facility] leadership team is also available to discuss your experience in person, feel free to contact us [phone].

## 2023-12-27 ENCOUNTER — Inpatient Hospital Stay: Payer: Medicaid Other | Attending: Internal Medicine | Admitting: Internal Medicine

## 2023-12-27 VITALS — BP 123/65 | HR 72 | Temp 98.4°F | Resp 17 | Ht <= 58 in | Wt 213.3 lb

## 2023-12-27 DIAGNOSIS — M549 Dorsalgia, unspecified: Secondary | ICD-10-CM | POA: Insufficient documentation

## 2023-12-27 DIAGNOSIS — Z8042 Family history of malignant neoplasm of prostate: Secondary | ICD-10-CM | POA: Insufficient documentation

## 2023-12-27 DIAGNOSIS — R9089 Other abnormal findings on diagnostic imaging of central nervous system: Secondary | ICD-10-CM | POA: Diagnosis not present

## 2023-12-27 DIAGNOSIS — R519 Headache, unspecified: Secondary | ICD-10-CM | POA: Insufficient documentation

## 2023-12-27 DIAGNOSIS — G43709 Chronic migraine without aura, not intractable, without status migrainosus: Secondary | ICD-10-CM

## 2023-12-27 DIAGNOSIS — G9389 Other specified disorders of brain: Secondary | ICD-10-CM | POA: Insufficient documentation

## 2023-12-27 DIAGNOSIS — M5430 Sciatica, unspecified side: Secondary | ICD-10-CM | POA: Diagnosis not present

## 2023-12-27 DIAGNOSIS — Z803 Family history of malignant neoplasm of breast: Secondary | ICD-10-CM | POA: Diagnosis not present

## 2023-12-27 MED ORDER — AMITRIPTYLINE HCL 25 MG PO TABS: 25.0000 mg | ORAL_TABLET | Freq: Every day | ORAL | 5 refills | Status: AC

## 2023-12-27 MED ORDER — AJOVY 225 MG/1.5ML ~~LOC~~ SOSY: PREFILLED_SYRINGE | SUBCUTANEOUS | 11 refills | Status: AC
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
## 2023-12-29 ENCOUNTER — Other Ambulatory Visit: Payer: Medicaid Other

## 2023-12-30 ENCOUNTER — Inpatient Hospital Stay: Admission: RE | Admit: 2023-12-30 | Payer: Medicaid Other | Source: Ambulatory Visit

## 2024-01-10 ENCOUNTER — Ambulatory Visit
Admission: RE | Admit: 2024-01-10 | Discharge: 2024-01-10 | Disposition: A | Discharge status: Home / Self Care | Payer: Medicaid Other | Source: Ambulatory Visit | Attending: Orthopedic Surgery | Admitting: Orthopedic Surgery

## 2024-01-10 ENCOUNTER — Ambulatory Visit
Admission: RE | Admit: 2024-01-10 | Discharge: 2024-01-10 | Discharge status: Home / Self Care | Payer: Medicaid Other | Source: Ambulatory Visit | Attending: Podiatry

## 2024-01-10 DIAGNOSIS — G5753 Tarsal tunnel syndrome, bilateral lower limbs: Secondary | ICD-10-CM

## 2024-01-10 DIAGNOSIS — G8929 Other chronic pain: Secondary | ICD-10-CM

## 2024-01-10 DIAGNOSIS — M5416 Radiculopathy, lumbar region: Secondary | ICD-10-CM

## 2024-01-11 ENCOUNTER — Other Ambulatory Visit: Payer: Medicaid Other

## 2024-01-14 ENCOUNTER — Encounter: Payer: Self-pay | Admitting: Podiatry

## 2024-01-14 ENCOUNTER — Ambulatory Visit: Payer: Medicaid Other | Admitting: Podiatry

## 2024-01-14 DIAGNOSIS — G5753 Tarsal tunnel syndrome, bilateral lower limbs: Secondary | ICD-10-CM | POA: Diagnosis not present

## 2024-01-14 DIAGNOSIS — M722 Plantar fascial fibromatosis: Secondary | ICD-10-CM | POA: Diagnosis not present

## 2024-01-14 DIAGNOSIS — M5432 Sciatica, left side: Secondary | ICD-10-CM

## 2024-01-14 MED ORDER — MELOXICAM 15 MG PO TABS
15.0000 mg | ORAL_TABLET | Freq: Every day | ORAL | 0 refills | Status: DC
Start: 2024-01-14 — End: 2024-06-19

## 2024-01-14 NOTE — Progress Notes (Unsigned)
 Chief Complaint  Patient presents with   Plantar Fasciitis    PF 1 month follow up. She states she has good moments and bad with this foot. She stated she has been out of the boot some at home. Vascular study and MRI have been completed. No tdiabetic and no anti coag.    HPI: 38 y.o. female presents today for follow up evaluation bilateral heel and ankle pain.   At times has had better days from pain standpoint.  Otherwise she has not had significant improvement.  She states that she is generally in and out of the boot of the foot and wears it intermittently.  Since last visit she has been seen by neurosurgery,[who agrees that the burning pain in medial ankles radiating up to buttocks left greater than right concern may be lumbosacral in nature.  She does endorse numbness, tingling and weakness in the legs.  History of DVT and PE.  MRI obtained, read pending.  Patient denies nausea, vomiting, fever, chills, shortness of breath Past Medical History:  Diagnosis Date   Allergic rhinitis    Allergy to cats    Allergy to dogs    Anemia    Anxiety    Asthma    BMI 37.0-37.9, adult    Brain tumor (benign) (HCC)    Breast cyst    Chronic pain syndrome    Deep vein thrombosis (DVT) (HCC)    DVT (deep venous thrombosis) (HCC)    DVT (deep venous thrombosis) (HCC)    Endometriosis, uterus    Environmental allergies    Fibromyalgia syndrome    GERD (gastroesophageal reflux disease)    Headache    lesion on brain that causes migraines   Hip discomfort    IBS (irritable bowel syndrome)    with diarrhea   Neuropathy    in left leg lower due to vein problem   Obesity    Ovarian cyst    Paroxysmal dyspnea    PTSD (post-traumatic stress disorder)    Patient reported   Uterine fibroid     Past Surgical History:  Procedure Laterality Date   APPENDECTOMY  2003   CESAREAN SECTION     DILATION AND CURETTAGE OF UTERUS     1 WEEK AGO   DILATION AND EVACUATION Bilateral 05/18/2016    Procedure: DILATATION AND EVACUATION;  Surgeon: Carrington Clamp, MD;  Location: [facility];  Service: Gynecology;  Laterality: Bilateral;   ECTOPIC PREGNANCY SURGERY     etopi     GASTRIC BYPASS  03/14/2012   INDUCED ABORTION     MYOMECTOMY  06/23/2020   OTHER SURGICAL HISTORY     surgical removal of nexplanon   OVARIAN CYST SURGERY      Allergies  Allergen Reactions   Fish Allergy Anaphylaxis   Other Swelling    Mayonnaise causes swelling of the tongue.   Penicillins Hives    Has patient had a PCN reaction causing immediate rash, facial/tongue/throat swelling, SOB or lightheadedness with hypotension: Yes Has patient had a PCN reaction causing severe rash involving mucus membranes or skin necrosis: No Has patient had a PCN reaction that required hospitalization No Has patient had a PCN reaction occurring within the last 10 years: Yes If all of the above answers are "NO", then may proceed with Cephalosporin use.     Shellfish Allergy Anaphylaxis    Patient is allergic to all seafood.  She states she can take the dye that is given in  CT scans even though she has some itching    Peanut Butter Flavoring Agent (Non-Screening) Hives   Cat Dander    Dog Epithelium (Canis Lupus Familiaris)    Histamine Other (See Comments)    Patient  tested positive in allergy testing to Control-histamine 1.   Voltaren [Diclofenac] Rash and Other (See Comments)    Volatren gel causes blisters.    ROS negative except as stated in HPI   Physical Exam: There were no vitals filed for this visit.  General: The patient is alert and oriented x3 in no acute distress. Ambulates unassisted. Patient is overweight.  Dermatology: Skin is warm, dry and supple bilateral lower extremities. Interspaces are clear of maceration and debris.  No open wounds or lesions appreciated  Vascular: Palpable pedal pulses bilaterally. Capillary refill within normal limits.  Mildly increased edema left greater than right.  No  erythema or calor.  Positive Homans' sign and pain with posterior calf squeeze left side.  Neurological: Light touch sensation grossly intact bilateral feet.  Positive Tinel sign bilaterally left greater than right.  Musculoskeletal Exam: Mild pes planus foot type appreciated bilaterally.  Muscle strength 5/5 in dorsiflexion, plantarflexion, inversion, eversion.  Reports some pain with resisted plantarflexion bilaterally, eversion left lower extremity.  Left: Tenderness on palpation of plantar medial calcaneal tubercle.  Localized edema present.  Localized edema present around the peroneal tendons at the level of the ankle.  Tenderness on palpation at this area and moving towards the fifth met base styloid process.  Tenderness on palpation of posterior calcaneus at the level of Achilles insertion  Right: Tenderness on palpation of plantar medial calcaneal tubercle.  There is now right achilles pain present and peroneal tendon pain present in similar distribution to the left side.  Radiographic Exam:  MRI left foot, official read pending  I did personally review the images, it did not appear to have significant pathology to the plantar fascia, there was some mild localized edema at the origin.  Veins around the level of the tarsal tunnel do appear prominent.  Assessment/Plan of Care: 1. Tarsal tunnel syndrome of both lower extremities   2. Sciatica of left side   3. Plantar fasciitis, bilateral      Meds ordered this encounter  Medications   meloxicam (MOBIC) 15 MG tablet    Sig: Take 1 tablet (15 mg total) by mouth daily.    Dispense:  30 tablet    Refill:  0   None  Discussed clinical findings with patient today.    # Tarsal tunnel syndrome bilateral versus sciatica pain, left greater than right -Findings discussed with patient.  Discussed my preliminary read of MRI with patient as well. -Discussed that anti-inflammatories with good supportive shoes and good orthotic devices can  be very beneficial for symptom relief.  She is deferring over-the-counter inserts for now. -Discussed physical therapy as a potential benefit.  Patient does not think she can do physical therapy as one of her children requires around-the-clock care.  Home rehab exercises dispensed to patient for now. -Do feel that based on impression of images, some of the symptoms could be venous in nature.  Also could be stemming from lumbosacral pain as well. -Will await official read and contact patient for further recommendations  # Left lower extremity swelling - Venous ultrasound was negative for DVT.  Ordered for concern for patient history of DVT and PE.  # Bilateral plantar fasciitis -Discussed with patient that overall her symptoms appear more neurologic in  nature at this point -Extending course of meloxicam as this has provided some relief for the patient. -Continue use of compressive anklet -Continue stretching regimen -Emphasized importance of good supportive shoes.  Recommend good over-the-counter insert patient is deferring at this point in time  Will contact patient with recommendations and plan for follow-up once MRI read has been finalized.   Nelson Julson L. Marchia Bond, AACFAS [facility]     [address] [place], [place] [address]                Office [phone]  Fax [phone]

## 2024-01-18 NOTE — Progress Notes (Signed)
 Telephone Visit- Progress Note: Referring Physician:  Simone Curia, MD [address] [place],  [place] [address]  Primary Physician:  Simone Curia, MD  This visit was performed via telephone.  Patient location: home Provider location: office  I spent a total of 10 minutes non-face-to-face activities for this visit on the date of this encounter including review of current clinical condition and response to treatment.    Patient has given verbal consent to this telephone visits and we reviewed the limitations of a telephone visit. Patient wishes to proceed.    Chief Complaint:  review lumbar MRI results  History of Present Illness: Nina Wood is a 38 y.o. female has a history of  migraines, neuropathy in left leg, chronic pain, DVT, PE, FM, GERD, IBS, obesity, and PTSD.    She has history of gastric bypass.    Seeing podiatry for bilateral plantar fasciitis, achilles and peroneal tendonitis.   Last seen by me on 12/26/23 for intermittent LBP with bilateral intermittent leg pain, left > right leg that started after having twins in September 2023. She has burning pain in her medial ankle that radiates up to her buttocks in both legs. She has numbness, tingling, and weakness in her legs.   Phone visit scheduled to review her lumbar MRI results.   She continues with intermittent LBP with constant bilateral leg pain that starts along medial ankle/foot and radiates up into her buttocks. Let leg > right leg, leg pain > LBP. She is also having intermittent mid back pain. She has numbness, tingling, and weakness in her legs. Pain is worse with any movement. Some improvement with rest, heat, and ice.   She is still wearing boot on left foot when she is out of house. She wears compression sleeves on both legs prn.    She is taking mobic, prn lyrica, prn ultram, prn norco 7.5.     She does not smoke.    Bowel/Bladder Dysfunction: she has urinary urgency. No bowel/bladder  incontinence.    Conservative measures:  Physical therapy:  has not participated in Multimodal medical therapy including regular antiinflammatories: meloxicam, lyrica, tramadol, hydrocodone Injections: no epidural steroid injections   Past Surgery: no spinal surgeries  Exam: No exam done as this was a telephone encounter.     Imaging: MRI of lumbar spine dated 01/10/24:   FINDINGS: Segmentation:  Standard.   Alignment:  Physiologic.   Vertebrae: No acute fracture, evidence of discitis, or aggressive bone lesion.   Conus medullaris and cauda equina: Conus extends to the L1-2 level. Conus and cauda equina appear normal.   Paraspinal and other soft tissues: No acute paraspinal abnormality.   Disc levels:   Disc spaces: Disc spaces are maintained.   T12-L1: No significant disc bulge. No neural foraminal stenosis. No central canal stenosis.   L1-L2: No significant disc bulge. No neural foraminal stenosis. No central canal stenosis.   L2-L3: No significant disc bulge. No neural foraminal stenosis. No central canal stenosis.   L3-L4: No significant disc bulge. No neural foraminal stenosis. No central canal stenosis.   L4-L5: No significant disc bulge. No neural foraminal stenosis. No central canal stenosis. Mild bilateral facet arthropathy.   L5-S1: No significant disc bulge. No neural foraminal stenosis. No central canal stenosis. Mild bilateral facet arthropathy.   IMPRESSION: 1. No significant lumbar spine disc protrusion, foraminal stenosis or central canal stenosis. 2. No acute osseous injury of the lumbar spine.     Electronically Signed  By: Elige Ko M.D.   On: 01/18/2024 15:09     I have personally reviewed the images and agree with the above interpretation.  Assessment and Plan: Ms. Kohls continues with intermittent LBP with constant bilateral leg pain that starts along medial ankle/foot and radiates up into her buttocks. Let leg > right leg, leg  pain > LBP. She is also having intermittent mid back pain. She has numbness, tingling, and weakness in her legs.   She has known mild facet hypertrophy L4-S1. This may be causing some of her LBP. I don't see cause of leg pain on her lumbar MRI.    Treatment options discussed with patient and following plan made:   - Discussed getting EMG/NCS of bilateral legs to further evaluate leg symptoms. She thinks she had this done previously and declines.  - Recommend PT for lumbar spine. She declines.  - She would like to follow up with podiatry to discuss further treatment options.  - Will message her in 4 weeks to check on her progress.   Drake Leach PA-C Neurosurgery

## 2024-01-21 ENCOUNTER — Ambulatory Visit (INDEPENDENT_AMBULATORY_CARE_PROVIDER_SITE_OTHER): Admitting: Orthopedic Surgery

## 2024-01-21 ENCOUNTER — Encounter: Payer: Self-pay | Admitting: Orthopedic Surgery

## 2024-01-21 DIAGNOSIS — M4726 Other spondylosis with radiculopathy, lumbar region: Secondary | ICD-10-CM

## 2024-01-21 DIAGNOSIS — M47816 Spondylosis without myelopathy or radiculopathy, lumbar region: Secondary | ICD-10-CM

## 2024-01-21 DIAGNOSIS — M5416 Radiculopathy, lumbar region: Secondary | ICD-10-CM

## 2024-03-03 ENCOUNTER — Encounter: Payer: Self-pay | Admitting: Internal Medicine

## 2024-03-12 NOTE — Telephone Encounter (Signed)
 Nina Wood

## 2024-03-17 ENCOUNTER — Encounter: Payer: Self-pay | Admitting: Podiatry

## 2024-03-18 ENCOUNTER — Ambulatory Visit: Admitting: Podiatry

## 2024-03-18 ENCOUNTER — Ambulatory Visit (INDEPENDENT_AMBULATORY_CARE_PROVIDER_SITE_OTHER)

## 2024-03-18 DIAGNOSIS — M7752 Other enthesopathy of left foot: Secondary | ICD-10-CM

## 2024-03-18 DIAGNOSIS — G5753 Tarsal tunnel syndrome, bilateral lower limbs: Secondary | ICD-10-CM

## 2024-03-18 DIAGNOSIS — S93492A Sprain of other ligament of left ankle, initial encounter: Secondary | ICD-10-CM

## 2024-03-18 DIAGNOSIS — G5762 Lesion of plantar nerve, left lower limb: Secondary | ICD-10-CM

## 2024-03-18 MED ORDER — TRIAMCINOLONE ACETONIDE 10 MG/ML IJ SUSP: 5.0000 mg | Freq: Once | INTRAMUSCULAR | Status: AC

## 2024-03-18 NOTE — Progress Notes (Unsigned)
 Chief Complaint  Patient presents with   Tarsal Tunnel Follow up    FU for Tarsal Tunnel to the left foot and ankle. She tripped on a toy last week, and is now hurting badly again. She thought she was ready for a brace.  Not diabetic andno anti coag.     HPI: 38 y.o. female presents today for follow up evaluation bilateral heel and ankle pain.   She reports recently stepping on her daughter's toy causing her to trip and injury left ankle. Reports pain, swelling, burning and tingling symptoms. Unfortunately her bilateral heel, ankle and lower extremity pain is largely unchanged. She does endorse numbness, tingling and weakness in the legs.  History of DVT and PE.   She also has critically ill child which makes it difficult for her to take time off her feet.  Patient denies nausea, vomiting, fever, chills, shortness of breath Past Medical History:  Diagnosis Date   Allergic rhinitis    Allergy to cats    Allergy to dogs    Anemia    Anxiety    Asthma    BMI 37.0-37.9, adult    Brain tumor (benign) (HCC)    Breast cyst    Chronic pain syndrome    Deep vein thrombosis (DVT) (HCC)    DVT (deep venous thrombosis) (HCC)    DVT (deep venous thrombosis) (HCC)    Endometriosis, uterus    Environmental allergies    Fibromyalgia syndrome    GERD (gastroesophageal reflux disease)    Headache    lesion on brain that causes migraines   Hip discomfort    IBS (irritable bowel syndrome)    with diarrhea   Neuropathy    in left leg lower due to vein problem   Obesity    Ovarian cyst    Paroxysmal dyspnea    PTSD (post-traumatic stress disorder)    Patient reported   Uterine fibroid     Past Surgical History:  Procedure Laterality Date   APPENDECTOMY  2003   CESAREAN SECTION     DILATION AND CURETTAGE OF UTERUS     1 WEEK AGO   DILATION AND EVACUATION Bilateral 05/18/2016   Procedure: DILATATION AND EVACUATION;  Surgeon: Matt Song, MD;  Location: [facility];  Service:  Gynecology;  Laterality: Bilateral;   ECTOPIC PREGNANCY SURGERY     etopi     GASTRIC BYPASS  03/14/2012   INDUCED ABORTION     MYOMECTOMY  06/23/2020   OTHER SURGICAL HISTORY     surgical removal of nexplanon   OVARIAN CYST SURGERY      Allergies  Allergen Reactions   Fish Allergy Anaphylaxis   Other Swelling    Mayonnaise causes swelling of the tongue.   Penicillins Hives    Has patient had a PCN reaction causing immediate rash, facial/tongue/throat swelling, SOB or lightheadedness with hypotension: Yes Has patient had a PCN reaction causing severe rash involving mucus membranes or skin necrosis: No Has patient had a PCN reaction that required hospitalization No Has patient had a PCN reaction occurring within the last 10 years: Yes If all of the above answers are "NO", then may proceed with Cephalosporin use.     Shellfish Allergy Anaphylaxis    Patient is allergic to all seafood.  She states she can take the dye that is given in CT scans even though she has some itching    Peanut Butter Flavoring Agent (Non-Screening) Hives   Cat Dander  Dog Epithelium (Canis Lupus Familiaris)    Histamine Other (See Comments)    Patient  tested positive in allergy testing to Control-histamine 1.   Voltaren [Diclofenac] Rash and Other (See Comments)    Volatren gel causes blisters.    ROS negative except as stated in HPI   Physical Exam: There were no vitals filed for this visit.  General: The patient is alert and oriented x3 in no acute distress. Ambulates unassisted. Patient is overweight.  Dermatology: Skin is warm, dry and supple bilateral lower extremities. Interspaces are clear of maceration and debris.  No open wounds or lesions appreciated  Vascular: Palpable pedal pulses bilaterally. Capillary refill within normal limits.  Mildly increased edema left greater than right.  No erythema or calor.  Positive Homans' sign and pain with posterior calf squeeze left  side.  Neurological: Light touch sensation grossly intact bilateral feet.  Positive Tinel sign bilaterally left greater than right.  Musculoskeletal Exam: Mild pes planus foot type appreciated bilaterally.  Muscle strength 5/5 in dorsiflexion, plantarflexion, inversion, eversion.  Reports some pain with resisted plantarflexion bilaterally, eversion left lower extremity.  Left: New pain to anteromedial ankle along TA and EHL tendons, anterior ankle capsule. No gaps palpated to the tendons or tendon thickening.  Neuritis symptoms on palpation of this area.  Tenderness and edema to anterior lateral ankle ligaments without gross instability noted.  Tenderness on palpation of plantar medial calcaneal tubercle.  Localized edema present.  Localized edema present around the peroneal tendons at the level of the ankle.    Right: Tenderness on palpation of plantar medial calcaneal tubercle.  There is now right achilles pain present and peroneal tendon pain present in similar distribution to the left side.  Radiographs: Left foot and ankle complete weightbearing 03/18/2024 Normal osseous mineralization.  Joint spaces preserved.  No acute fractures or acute osseous abnormalities appreciated.  Wide forefoot noted.  Assessment/Plan of Care: 1. Tarsal tunnel syndrome of both lower extremities   2. Adhesive capsulitis of ankle, left   3. Left ankle tendinitis      Meds ordered this encounter  Medications   triamcinolone  acetonide (KENALOG ) 10 MG/ML injection 5 mg   None  Discussed clinical findings with patient today.  Radiographs reviewed with patient  # Strain of anterior ankle tendons with neuritis- new problem # Left ankle sprain- new problem - Verbal consent obtained to administer corticosteroid injection along the anterior extensor tendons at ankle level left foot - Alcohol skin prep.  Injected 0.5 cc of 0.5% Marcaine plain, 0.5 cc of 2% likely pain, 0.5 cc of Kenalog  10.  Band-Aid applied.  Patient tolerated well - Return to cam boot over the next 2 to 3 weeks, utilize RICE therapy -Does have rx for meloxicam  she can continue this - Home rehab instructions for left ankle sprain anterior ankle tendinitis dispensed and discussed with patient  # Tarsal tunnel syndrome bilateral versus sciatica pain, left greater than right - No significant changes from previously - Suspect tarsal tunnel symptoms due to prominent veins noted. - Recommend that she wear compression stockings and continue with the over-the-counter inserts. - Discussed with patient she may want to consider evaluation from pain specialist as this could be aggravating her tarsal tunnel symptoms. -Not wanting invasive treatment or surgery at this time -Has seen neurosurgery, they do not feel that her back is contributing to these symptoms, though she does have Lumbar back pain   Follow-up in about 3 weeks.   Fadumo Heng L. Marvis Sluder, DPM,  AACFAS [facility]     [address] [place], [place] [address]                Office [phone]  Fax [phone]

## 2024-03-18 NOTE — Patient Instructions (Signed)
Ankle Sprain   An ankle sprain is a stretch or tear in a ligament in the ankle. Ligaments are tissues that connect bones to each other. The two most common types of ankle sprains are: Inversion sprain. This happens when the foot turns inward and the ankle rolls outward. It affects the ligament on the outside of the foot (lateral ligament). Eversion sprain. This happens when the foot turns outward and the ankle rolls inward. It affects the ligament on the inner side of the foot (medial ligament). What are the causes? This condition is often caused by accidentally rolling or twisting the ankle. What increases the risk? You are more likely to develop this condition if you play sports. What are the signs or symptoms?  Symptoms of this condition include: Pain in your ankle. Swelling. Bruising. This may develop right after you sprain your ankle or 1-2 days later. Trouble standing or walking, especially when you turn or change directions. How is this diagnosed? This condition is diagnosed with: A physical exam. During the exam, your health care provider will press on certain parts of your foot and ankle and try to move them in certain ways. X-ray imaging. These may be taken to see how severe the sprain is and to check for broken bones. How is this treated? This condition may be treated with: A brace or splint. This is used to keep the ankle from moving until it heals. An elastic bandage. This is used to support the ankle. Crutches. Pain medicine. Surgery. This may be needed if the sprain is severe. Physical therapy. This may help to improve the range of motion in the ankle. Follow these instructions at home: If you have a brace or a splint: Wear the brace or splint as told by your health care provider. Remove it only as told by your health care provider. Loosen the brace or splint if your toes tingle, become numb, or turn cold and blue. Keep the brace or splint clean. If the brace or  splint is not waterproof: Do not let it get wet. Cover it with a watertight covering when you take a bath or a shower. If you have an elastic bandage (dressing): Remove it to shower or bathe. Try not to move your ankle much, but wiggle your toes from time to time. This helps to prevent swelling. Adjust the dressing to make it more comfortable if it feels too tight. Loosen the dressing if you have numbness or tingling in your foot, or if your foot becomes cold and blue. Managing pain, stiffness, and swelling   Take over-the-counter and prescription medicines only as told by your health care provider. For 2-3 days, keep your ankle raised (elevated) above the level of your heart as much as possible. If directed, put ice on the injured area: If you have a removable brace or splint, remove it as told by your health care provider. Put ice in a plastic bag. Place a towel between your skin and the bag. Leave the ice on for 20 minutes, 2-3 times a day. General instructions Rest your ankle. Do not use the injured limb to support your body weight until your health care provider says that you can. Use crutches as told by your health care provider. Do not use any products that contain nicotine or tobacco, such as cigarettes, e-cigarettes, and chewing tobacco. If you need help quitting, ask your health care provider. Keep all follow-up visits as told by your health care provider. This is important. Contact a  health care provider if: You have rapidly increasing bruising or swelling. Your pain is not relieved with medicine. Get help right away if: Your foot or toes become numb or blue. You have severe pain that gets worse. Summary An ankle sprain is a stretch or tear in a ligament in the ankle. Ligaments are tissues that connect bones to each other. This condition is often caused by accidentally rolling or twisting the ankle. Symptoms include pain, swelling, bruising, and trouble walking. To relieve  pain and swelling, put ice on the affected ankle, raise your ankle above the level of your heart, and use an elastic bandage. Keep all follow-up visits as told by your health care provider. This is important. This information is not intended to replace advice given to you by your health care provider. Make sure you discuss any questions you have with your health care provider. Document Revised: 07/22/2018 Document Reviewed: 03/26/2018 Elsevier Patient Education  [place].  Ankle Sprain, Phase I Rehab An ankle sprain is an injury to the ligaments of your ankle. Ankle sprains cause stiffness, loss of motion, and loss of strength. Ask your health care provider which exercises are safe for you. Do exercises exactly as told by your health care provider and adjust them as directed. It is normal to feel mild stretching, pulling, tightness, or discomfort as you do these exercises. Stop right away if you feel sudden pain or your pain gets worse. Do not begin these exercises until told by your health care provider. Stretching and range-of-motion exercises These exercises warm up your muscles and joints and improve the movement and flexibility of your lower leg and ankle. These exercises also help to relieve pain and stiffness. Gastroc and soleus stretch  This exercise is also called a calf stretch. It stretches the muscles in the back of the lower leg. These muscles are the gastrocnemius, or gastroc, and the soleus. Sit on the floor with your left / right leg extended. Loop a belt or towel around the ball of your left / right foot. The ball of your foot is on the walking surface, right under your toes. Keep your left / right ankle and foot relaxed and keep your knee straight while you use the belt or towel to pull your foot toward you. You should feel a gentle stretch behind your calf or knee in your gastroc muscle. Hold this position for 15 seconds, then release to the starting position. Repeat the  exercise with your knee bent. You can put a pillow or a rolled bath towel under your knee to support it. You should feel a stretch deep in your calf in the soleus muscle or at your Achilles tendon. Repeat 5 times. Complete this exercise 2 times a day. Ankle alphabet   Sit with your left / right leg supported at the lower leg. Do not rest your foot on anything. Make sure your foot has room to move freely. Think of your left / right foot as a paintbrush. Move your foot to trace each letter of the alphabet in the air. Keep your hip and knee still while you trace. Make the letters as large as you can without feeling discomfort. Trace every letter from A to Z. Repeat 5 times. Complete this exercise 2 times a day. Strengthening exercises These exercises build strength and endurance in your ankle and lower leg. Endurance is the ability to use your muscles for a long time, even after they get tired. Ankle dorsiflexion   Secure  a rubber exercise band or tube to an object, such as a table leg, that will stay still when the band is pulled. Secure the other end around your left / right foot. Sit on the floor facing the object, with your left / right leg extended. The band or tube should be slightly tense when your foot is relaxed. Slowly bring your foot toward you, bringing the top of your foot toward your shin (dorsiflexion), and pulling the band tighter. Hold this position for 15 seconds. Slowly return your foot to the starting position. Repeat 5 times. Complete this exercise 2 times a day. Ankle plantar flexion   Sit on the floor with your left / right leg extended. Loop a rubber exercise tube or band around the ball of your left / right foot. The ball of your foot is on the walking surface, right under your toes. Hold the ends of the band or tube in your hands. The band or tube should be slightly tense when your foot is relaxed. Slowly point your foot and toes downward to tilt the top of your  foot away from your shin (plantar flexion). Hold this position for 15 seconds. Slowly return your foot to the starting position. Repeat 5 times. Complete this exercise 2times a day. Ankle eversion Sit on the floor with your legs straight out in front of you. Loop a rubber exercise band or tube around the ball of your left / right foot. The ball of your foot is on the walking surface, right under your toes. Hold the ends of the band in your hands, or secure the band to a stable object. The band or tube should be slightly tense when your foot is relaxed. Slowly push your foot outward, away from your other leg (eversion). Hold this position for 15 seconds. Slowly return your foot to the starting position. Repeat __________ times. Complete this exercise __________ times a day. This information is not intended to replace advice given to you by your health care provider. Make sure you discuss any questions you have with your health care provider. Document Revised: 02/18/2019 Document Reviewed: 08/12/2018 Elsevier Patient Education  2020 [employer].

## 2024-04-15 ENCOUNTER — Ambulatory Visit: Admitting: Podiatry

## 2024-05-19 ENCOUNTER — Encounter: Payer: Self-pay | Admitting: Podiatry

## 2024-05-19 ENCOUNTER — Ambulatory Visit: Admitting: Podiatry

## 2024-05-19 DIAGNOSIS — G5753 Tarsal tunnel syndrome, bilateral lower limbs: Secondary | ICD-10-CM

## 2024-05-19 DIAGNOSIS — I872 Venous insufficiency (chronic) (peripheral): Secondary | ICD-10-CM

## 2024-05-19 DIAGNOSIS — M722 Plantar fascial fibromatosis: Secondary | ICD-10-CM

## 2024-05-19 DIAGNOSIS — S96819A Strain of other specified muscles and tendons at ankle and foot level, unspecified foot, initial encounter: Secondary | ICD-10-CM

## 2024-05-19 NOTE — Patient Instructions (Addendum)
 Sciatica Rehab Ask your health care provider which exercises are safe for you. Do exercises exactly as told by your health care provider and adjust them as directed. It is normal to feel mild stretching, pulling, tightness, or discomfort as you do these exercises. Stop right away if you feel sudden pain or your pain gets worse. Do not begin these exercises until told by your health care provider. Stretching and range-of-motion exercises These exercises warm up your muscles and joints and improve the movement and flexibility of your hips and back. These exercises also help to relieve pain, numbness, and tingling. Sciatic nerve glide  Sit in a chair with your head facing down toward your chest. Place your hands behind your back. Let your shoulders slump forward. Slowly straighten one of your legs while you tilt your head back as if you are looking toward the ceiling. Only straighten your leg as far as you can without making your symptoms worse. Hold this position for __________ seconds. Slowly return your leg and head back to the starting position. Repeat with your other leg. Repeat __________ times. Complete this exercise __________ times a day. Knee to chest with hip adduction and internal rotation  Lie on your back on a firm surface with both legs straight. Bend one of your knees and move it up toward your chest until you feel a gentle stretch in your lower back and buttock. Then, move your knee toward the shoulder that is on the opposite side from your leg. This is hip adduction and internal rotation. Hold your leg in this position by holding on to the front of your knee. Hold this position for __________ seconds. Slowly return to the starting position. Repeat with your other leg. Repeat __________ times. Complete this exercise __________ times a day. Prone extension on elbows  Lie on your abdomen on a firm surface. A bed may be too soft for this exercise. Prop yourself up on your  elbows. Use your arms to help lift your chest up until you feel a gentle stretch in your abdomen and your lower back. This will place some of your body weight on your elbows. If this is uncomfortable, try stacking pillows under your chest. Your hips should stay down, against the surface that you are lying on. Keep your hip and back muscles relaxed. Hold this position for __________ seconds. Slowly relax your upper body and return to the starting position. Repeat __________ times. Complete this exercise __________ times a day. Strengthening exercises These exercises build strength and endurance in your back. Endurance is the ability to use your muscles for a long time, even after they get tired. Pelvic tilt This exercise strengthens the muscles that lie deep in the abdomen. Lie on your back on a firm surface. Bend your knees and keep your feet flat on the surface. Tense your abdominal muscles. Tip your pelvis up toward the ceiling and flatten your lower back into the firm surface. To help with this exercise, you may place a small towel under your lower back and try to push your back into the towel. Hold this position for __________ seconds. Let your muscles relax completely before you repeat this exercise. Repeat __________ times. Complete this exercise __________ times a day. Alternating arm and leg raises  Get on your hands and knees on a firm surface. If you are on a hard floor, you may want to use padding, such as an exercise mat, to cushion your knees. Line up your arms and legs. Your  hands should be directly below your shoulders, and your knees should be directly below your hips. Lift your left leg behind you. At the same time, raise your right arm and straighten it in front of you. Do not lift your leg higher than your hip. Do not lift your arm higher than your shoulder. Keep your abdominal and back muscles tight. Keep your hips facing the ground. Do not arch your back. Keep your  balance carefully, and do not hold your breath. Hold this position for __________ seconds. Slowly return to the starting position. Repeat with your right leg and your left arm. Repeat __________ times. Complete this exercise __________ times a day. Posture and body mechanics Good posture and healthy body mechanics can help to relieve stress in your body's tissues and joints. Body mechanics refers to the movements and positions of your body while you do your daily activities. Posture is part of body mechanics. Good posture means: Your spine is in its natural S-curve position (neutral). Your shoulders are pulled back slightly. Your head is not tipped forward. Follow these guidelines to improve your posture and body mechanics in your everyday activities. Standing  When standing, keep your spine neutral and your feet about hip width apart. Keep a slight bend in your knees. Your ears, shoulders, and hips should line up. When you do a task in which you stand in one place for a long time, place one foot up on a stable object that is 2-4 inches (5-10 cm) high, such as a footstool. This helps keep your spine neutral. Sitting  When sitting, keep your spine neutral and keep your feet flat on the floor. Use a footrest, if necessary, and keep your thighs parallel to the floor. Avoid rounding your shoulders, and avoid tilting your head forward. When working at a desk or a computer, keep your desk at a height where your hands are slightly lower than your elbows. Slide your chair under your desk so you are close enough to maintain good posture. When working at a computer, place your monitor at a height where you are looking straight ahead and you do not have to tilt your head forward or downward to look at the screen. Resting  When lying down and resting, avoid positions that are most painful for you. If you have pain with activities such as sitting, bending, stooping, or squatting, lie in a position in which  your body does not bend very much. For example, avoid curling up on your side with your arms and knees near your chest (fetal position). If you have pain with activities such as standing for a long time or reaching with your arms, lie with your spine in a neutral position and bend your knees slightly. Try the following positions: Lying on your side with a pillow between your knees. Lying on your back with a pillow under your knees. Lifting  When lifting objects, keep your feet at least shoulder width apart and tighten your abdominal muscles. Bend your knees and hips and keep your spine neutral. It is important to lift using the strength of your legs, not your back. Do not lock your knees straight out. Always ask for help to lift heavy or awkward objects. This information is not intended to replace advice given to you by your health care provider. Make sure you discuss any questions you have with your health care provider. Document Revised: 02/07/2022 Document Reviewed: 02/07/2022 [employer] Patient Education  2024 [employer]  Plantar Fasciitis (Heel [place]  Syndrome) with Rehab The plantar fascia is a fibrous, ligament-like, soft-tissue structure that spans the bottom of the foot. Plantar fasciitis is a condition that causes pain in the foot due to inflammation of the tissue. SYMPTOMS  Pain and tenderness on the underneath side of the foot. Pain that worsens with standing or walking. CAUSES  Plantar fasciitis is caused by irritation and injury to the plantar fascia on the underneath side of the foot. Common mechanisms of injury include: Direct trauma to bottom of the foot. Damage to a small nerve that runs under the foot where the main fascia attaches to the heel bone. Stress placed on the plantar fascia due to bone spurs. RISK INCREASES WITH:  Activities that place stress on the plantar fascia (running, jumping, pivoting, or cutting). Poor strength and flexibility. Improperly fitted  shoes. Tight calf muscles. Flat feet. Failure to warm-up properly before activity. Obesity. PREVENTION Warm up and stretch properly before activity. Allow for adequate recovery between workouts. Maintain physical fitness: Strength, flexibility, and endurance. Cardiovascular fitness. Maintain a health body weight. Avoid stress on the plantar fascia. Wear properly fitted shoes, including arch supports for individuals who have flat feet.  PROGNOSIS  If treated properly, then the symptoms of plantar fasciitis usually resolve without surgery. However, occasionally surgery is necessary.  RELATED COMPLICATIONS  Recurrent symptoms that may result in a chronic condition. Problems of the lower back that are caused by compensating for the injury, such as limping. Pain or weakness of the foot during push-off following surgery. Chronic inflammation, scarring, and partial or complete fascia tear, occurring more often from repeated injections.  TREATMENT  Treatment initially involves the use of ice and medication to help reduce pain and inflammation. The use of strengthening and stretching exercises may help reduce pain with activity, especially stretches of the Achilles tendon. These exercises may be performed at home or with a therapist. Your caregiver may recommend that you use heel cups of arch supports to help reduce stress on the plantar fascia. Occasionally, corticosteroid injections are given to reduce inflammation. If symptoms persist for greater than 6 months despite non-surgical (conservative), then surgery may be recommended.   MEDICATION  If pain medication is necessary, then nonsteroidal anti-inflammatory medications, such as aspirin and ibuprofen , or other minor pain relievers, such as acetaminophen , are often recommended. Do not take pain medication within 7 days before surgery. Prescription pain relievers may be given if deemed necessary by your caregiver. Use only as directed and  only as much as you need. Corticosteroid injections may be given by your caregiver. These injections should be reserved for the most serious cases, because they may only be given a certain number of times.  HEAT AND COLD Cold treatment (icing) relieves pain and reduces inflammation. Cold treatment should be applied for 10 to 15 minutes every 2 to 3 hours for inflammation and pain and immediately after any activity that aggravates your symptoms. Use ice packs or massage the area with a piece of ice (ice massage). Heat treatment may be used prior to performing the stretching and strengthening activities prescribed by your caregiver, physical therapist, or athletic trainer. Use a heat pack or soak the injury in warm water.  SEEK IMMEDIATE MEDICAL CARE IF: Treatment seems to offer no benefit, or the condition worsens. Any medications produce adverse side effects.  EXERCISES- RANGE OF MOTION (ROM) AND STRETCHING EXERCISES - Plantar Fasciitis (Heel Spur Syndrome) These exercises may help you when beginning to rehabilitate your injury. Your symptoms may resolve with  or without further involvement from your physician, physical therapist or athletic trainer. While completing these exercises, remember:  Restoring tissue flexibility helps normal motion to return to the joints. This allows healthier, less painful movement and activity. An effective stretch should be held for at least 30 seconds. A stretch should never be painful. You should only feel a gentle lengthening or release in the stretched tissue.  RANGE OF MOTION - Toe Extension, Flexion Sit with your right / left leg crossed over your opposite knee. Grasp your toes and gently pull them back toward the top of your foot. You should feel a stretch on the bottom of your toes and/or foot. Hold this stretch for 10 seconds. Now, gently pull your toes toward the bottom of your foot. You should feel a stretch on the top of your toes and or foot. Hold  this stretch for 10 seconds. Repeat  times. Complete this stretch 3 times per day.   RANGE OF MOTION - Ankle Dorsiflexion, Active Assisted Remove shoes and sit on a chair that is preferably not on a carpeted surface. Place right / left foot under knee. Extend your opposite leg for support. Keeping your heel down, slide your right / left foot back toward the chair until you feel a stretch at your ankle or calf. If you do not feel a stretch, slide your bottom forward to the edge of the chair, while still keeping your heel down. Hold this stretch for 10 seconds. Repeat 3 times. Complete this stretch 2 times per day.   STRETCH  Gastroc, Standing Place hands on wall. Extend right / left leg, keeping the front knee somewhat bent. Slightly point your toes inward on your back foot. Keeping your right / left heel on the floor and your knee straight, shift your weight toward the wall, not allowing your back to arch. You should feel a gentle stretch in the right / left calf. Hold this position for 10 seconds. Repeat 3 times. Complete this stretch 2 times per day.  STRETCH  Soleus, Standing Place hands on wall. Extend right / left leg, keeping the other knee somewhat bent. Slightly point your toes inward on your back foot. Keep your right / left heel on the floor, bend your back knee, and slightly shift your weight over the back leg so that you feel a gentle stretch deep in your back calf. Hold this position for 10 seconds. Repeat 3 times. Complete this stretch 2 times per day.  STRETCH  Gastrocsoleus, Standing  Note: This exercise can place a lot of stress on your foot and ankle. Please complete this exercise only if specifically instructed by your caregiver.  Place the ball of your right / left foot on a step, keeping your other foot firmly on the same step. Hold on to the wall or a rail for balance. Slowly lift your other foot, allowing your body weight to press your heel down over the edge of  the step. You should feel a stretch in your right / left calf. Hold this position for 10 seconds. Repeat this exercise with a slight bend in your right / left knee. Repeat 3 times. Complete this stretch 2 times per day.   STRENGTHENING EXERCISES - Plantar Fasciitis (Heel Spur Syndrome)  These exercises may help you when beginning to rehabilitate your injury. They may resolve your symptoms with or without further involvement from your physician, physical therapist or athletic trainer. While completing these exercises, remember:  Muscles can gain both the  endurance and the strength needed for everyday activities through controlled exercises. Complete these exercises as instructed by your physician, physical therapist or athletic trainer. Progress the resistance and repetitions only as guided.  STRENGTH - Towel Curls Sit in a chair positioned on a non-carpeted surface. Place your foot on a towel, keeping your heel on the floor. Pull the towel toward your heel by only curling your toes. Keep your heel on the floor. Repeat 3 times. Complete this exercise 2 times per day.  STRENGTH - Ankle Inversion Secure one end of a rubber exercise band/tubing to a fixed object (table, pole). Loop the other end around your foot just before your toes. Place your fists between your knees. This will focus your strengthening at your ankle. Slowly, pull your big toe up and in, making sure the band/tubing is positioned to resist the entire motion. Hold this position for 10 seconds. Have your muscles resist the band/tubing as it slowly pulls your foot back to the starting position. Repeat 3 times. Complete this exercises 2 times per day.  Document Released: 10/30/2005 Document Revised: 01/22/2012 Document Reviewed: 02/11/2009 [facility] Patient Information 2014 [place], [place].

## 2024-05-19 NOTE — Progress Notes (Unsigned)
 Chief Complaint  Patient presents with   Tarsal Tunnel    FU for bilateral tarsal tunnel. The right foot is in a lot of pain today, her daughter flopped on it. Wearing boot on the left and brought in the ankle brace you recommend.     HPI: 38 y.o. female presents today for follow up evaluation bilateral heel and ankle pain.   She does report recently that when playing, her daughter fell against her right dorsal foot and she is concerned she may have felt a pop has had persistent pain to the area.  Unfortunately her bilateral heel, ankle and lower extremity pain is largely unchanged. She does endorse numbness, tingling and weakness in the legs, hips and buttocks.  History of DVT and PE.   She also has critically ill child which makes it difficult for her to take time off her feet.  Patient denies nausea, vomiting, fever, chills, shortness of breath Past Medical History:  Diagnosis Date   Allergic rhinitis    Allergy to cats    Allergy to dogs    Anemia    Anxiety    Asthma    BMI 37.0-37.9, adult    Brain tumor (benign) (HCC)    Breast cyst    Chronic pain syndrome    Deep vein thrombosis (DVT) (HCC)    DVT (deep venous thrombosis) (HCC)    DVT (deep venous thrombosis) (HCC)    Endometriosis, uterus    Environmental allergies    Fibromyalgia syndrome    GERD (gastroesophageal reflux disease)    Headache    lesion on brain that causes migraines   Hip discomfort    IBS (irritable bowel syndrome)    with diarrhea   Neuropathy    in left leg lower due to vein problem   Obesity    Ovarian cyst    Paroxysmal dyspnea    PTSD (post-traumatic stress disorder)    Patient reported   Uterine fibroid     Past Surgical History:  Procedure Laterality Date   APPENDECTOMY  2003   CESAREAN SECTION     DILATION AND CURETTAGE OF UTERUS     1 WEEK AGO   DILATION AND EVACUATION Bilateral 05/18/2016   Procedure: DILATATION AND EVACUATION;  Surgeon: Nina Luna, MD;   Location: [facility];  Service: Gynecology;  Laterality: Bilateral;   ECTOPIC PREGNANCY SURGERY     etopi     GASTRIC BYPASS  03/14/2012   INDUCED ABORTION     MYOMECTOMY  06/23/2020   OTHER SURGICAL HISTORY     surgical removal of nexplanon   OVARIAN CYST SURGERY      Allergies  Allergen Reactions   Fish Allergy Anaphylaxis   Other Swelling    Mayonnaise causes swelling of the tongue.   Penicillins Hives    Has patient had a PCN reaction causing immediate rash, facial/tongue/throat swelling, SOB or lightheadedness with hypotension: Yes Has patient had a PCN reaction causing severe rash involving mucus membranes or skin necrosis: No Has patient had a PCN reaction that required hospitalization No Has patient had a PCN reaction occurring within the last 10 years: Yes If all of the above answers are NO, then may proceed with Cephalosporin use.     Shellfish Allergy Anaphylaxis    Patient is allergic to all seafood.  She states she can take the dye that is given in CT scans even though she has some itching    Peanut Butter Flavoring  Agent (Non-Screening) Hives   Cat Dander    Dog Epithelium (Canis Lupus Familiaris)    Histamine Other (See Comments)    Patient  tested positive in allergy testing to Control-histamine 1.   Voltaren [Diclofenac] Rash and Other (See Comments)    Volatren gel causes blisters.    ROS negative except as stated in HPI   Physical Exam: There were no vitals filed for this visit.  General: The patient is alert and oriented x3 in no acute distress. Ambulates unassisted. Patient is overweight.  Dermatology: Skin is warm, dry and supple bilateral lower extremities. Interspaces are clear of maceration and debris.  No open wounds or lesions appreciated  Vascular: Palpable pedal pulses bilaterally. Capillary refill within normal limits.  Mild nonpitting edema about the ankles present..  No erythema or calor.  Negative Homans' sign bilaterally.  Telangiectasias  bilaterally.  Neurological: Light touch sensation grossly intact bilateral feet.  Positive Tinel sign bilaterally left greater than right.  Musculoskeletal Exam: Mild pes planus foot type appreciated bilaterally.  Muscle strength 5/5 in dorsiflexion, plantarflexion, inversion, eversion.  Pain on palpation of plantar medial and plantar central heel bilaterally  Right dorsal foot there is some tenderness along extensor tendons without palpable dell.  Tenderness does appear to be out of proportion.  Pain with resisted dorsiflexion and with plantarflexion.   Assessment/Plan of Care: 1. Tarsal tunnel syndrome of both lower extremities   2. Strain of extensor tendon of foot   3. Venous (peripheral) insufficiency      No orders of the defined types were placed in this encounter.  None  Discussed clinical findings with patient today.  Radiographs reviewed with patient  # Strain of right anterior ankle tendons Discussed the etiology and treatment options for TA/EHL strain.  We discussed that these types of injuries and respond well to anti-inflammatories, rest, immobilization and home rehab.  I reviewed RICE protocol with the patient.  Recommend the following treatment plan:  -Immobilization with Cam boot - Continue with home meloxicam  15 mg once a day -Recommended topical diclofenac gel 1% to apply daily 3-4 times on the painful areas -Recommended twice daily stretching through these exercises reviewed with the patient and an informational handout was given to the patient.   # Tarsal tunnel syndrome bilateral versus sciatica pain, left greater than right - No significant changes from previously - Suspect tarsal tunnel symptoms due to prominent veins noted on prior left foot MRI. - Recommend that she wear compression stockings and continue with the over-the-counter inserts. - She is agreeable to a referral to a vein specialist for further evaluation. - Patient not wanting invasive  treatment or surgery at this time.  We did discuss that sometimes tarsal tunnel syndrome does require surgical decompression to get relief from symptoms -Did express with patient that some of the pain is likely mediated from sciatica symptoms as well.  Did discuss sciatica home rehab exercises.  She may need PT for this.  # Venous insufficiency bilaterally - Does have telangiectasias and mild nonpitting edema to bilateral lower extremities. -Prior Left lower extremity MRI suggestive of prominent veins in tarsal tunnel -We have previously discussed referral to vein specialist, she would like to go through with this to see if anything can be done to alleviate her symptoms short of surgical decompression of the tarsal tunnel. - Recommend that she proceed with home compression stockings in the meantime.    Follow-up in about 4 weeks.   Verta Riedlinger L. Lamount, DPM, AACFAS [facility]     [address] [place], [place] [address]                Office [phone]  Fax [phone]

## 2024-06-15 ENCOUNTER — Encounter: Payer: Self-pay | Admitting: Internal Medicine

## 2024-06-16 ENCOUNTER — Ambulatory Visit: Admitting: Podiatry

## 2024-06-17 ENCOUNTER — Other Ambulatory Visit: Payer: Self-pay

## 2024-06-19 ENCOUNTER — Ambulatory Visit: Admitting: Podiatry

## 2024-06-19 ENCOUNTER — Encounter: Payer: Self-pay | Admitting: Podiatry

## 2024-06-19 DIAGNOSIS — G5752 Tarsal tunnel syndrome, left lower limb: Secondary | ICD-10-CM | POA: Diagnosis not present

## 2024-06-19 DIAGNOSIS — G5751 Tarsal tunnel syndrome, right lower limb: Secondary | ICD-10-CM | POA: Diagnosis not present

## 2024-06-19 DIAGNOSIS — G5753 Tarsal tunnel syndrome, bilateral lower limbs: Secondary | ICD-10-CM

## 2024-06-19 DIAGNOSIS — M216X1 Other acquired deformities of right foot: Secondary | ICD-10-CM

## 2024-06-19 DIAGNOSIS — S96819A Strain of other specified muscles and tendons at ankle and foot level, unspecified foot, initial encounter: Secondary | ICD-10-CM

## 2024-06-19 DIAGNOSIS — I872 Venous insufficiency (chronic) (peripheral): Secondary | ICD-10-CM

## 2024-06-19 DIAGNOSIS — M5432 Sciatica, left side: Secondary | ICD-10-CM

## 2024-06-19 DIAGNOSIS — M216X2 Other acquired deformities of left foot: Secondary | ICD-10-CM | POA: Diagnosis not present

## 2024-06-19 MED ORDER — MELOXICAM 15 MG PO TABS: 15.0000 mg | ORAL_TABLET | Freq: Every day | ORAL | 0 refills | Status: AC

## 2024-06-19 NOTE — Patient Instructions (Signed)
 [email or web address]  Follow attached link for extensor tendon injury rehab exercises for your ankle.

## 2024-06-19 NOTE — Progress Notes (Unsigned)
 Chief Complaint  Patient presents with   Foot Pain    R foot is feeling better.  L foot gets to a point where she can't put weight on it.  Boot is broken, piece out of side.    HPI: 38 y.o. female presents today for follow up bilateral tarsal tunnel symptoms and right anterior extensor tendon strain.  Symptoms are mildly improved right foot, minimal change to left foot, symptoms are significant and limit activity. She has not seen vein specialist at this point, mixed use of compression stockings.  She also has critically ill child which makes it difficult for her to take time off her feet.  Patient denies nausea, vomiting, fever, chills, shortness of breath Past Medical History:  Diagnosis Date   Allergic rhinitis    Allergy to cats    Allergy to dogs    Anemia    Anxiety    Asthma    BMI 37.0-37.9, adult    Brain tumor (benign) (HCC)    Breast cyst    Chronic pain syndrome    Deep vein thrombosis (DVT) (HCC)    DVT (deep venous thrombosis) (HCC)    DVT (deep venous thrombosis) (HCC)    Endometriosis, uterus    Environmental allergies    Fibromyalgia syndrome    GERD (gastroesophageal reflux disease)    Headache    lesion on brain that causes migraines   Hip discomfort    IBS (irritable bowel syndrome)    with diarrhea   Neuropathy    in left leg lower due to vein problem   Obesity    Ovarian cyst    Paroxysmal dyspnea    PTSD (post-traumatic stress disorder)    Patient reported   Uterine fibroid     Past Surgical History:  Procedure Laterality Date   APPENDECTOMY  2003   CESAREAN SECTION     DILATION AND CURETTAGE OF UTERUS     1 WEEK AGO   DILATION AND EVACUATION Bilateral 05/18/2016   Procedure: DILATATION AND EVACUATION;  Surgeon: Rosaline Luna, MD;  Location: [facility];  Service: Gynecology;  Laterality: Bilateral;   ECTOPIC PREGNANCY SURGERY     etopi     GASTRIC BYPASS  03/14/2012   INDUCED ABORTION     MYOMECTOMY  06/23/2020   OTHER SURGICAL  HISTORY     surgical removal of nexplanon   OVARIAN CYST SURGERY      Allergies  Allergen Reactions   Fish Allergy Anaphylaxis   Other Swelling    Mayonnaise causes swelling of the tongue.   Penicillins Hives    Has patient had a PCN reaction causing immediate rash, facial/tongue/throat swelling, SOB or lightheadedness with hypotension: Yes Has patient had a PCN reaction causing severe rash involving mucus membranes or skin necrosis: No Has patient had a PCN reaction that required hospitalization No Has patient had a PCN reaction occurring within the last 10 years: Yes If all of the above answers are NO, then may proceed with Cephalosporin use.     Shellfish Allergy Anaphylaxis    Patient is allergic to all seafood.  She states she can take the dye that is given in CT scans even though she has some itching    Peanut Butter Flavoring Agent (Non-Screening) Hives   Cat Dander    Dog Epithelium (Canis Lupus Familiaris)    Histamine Other (See Comments)    Patient  tested positive in allergy testing to Control-histamine 1.   Voltaren [  Diclofenac] Rash and Other (See Comments)    Volatren gel causes blisters.    ROS negative except as stated in HPI   Physical Exam: There were no vitals filed for this visit.  General: The patient is alert and oriented x3 in no acute distress. Ambulates unassisted. Patient is overweight.  Dermatology: Skin is warm, dry and supple bilateral lower extremities. Interspaces are clear of maceration and debris.  No open wounds or lesions appreciated  Vascular: Palpable pedal pulses bilaterally. Capillary refill within normal limits.  Mild nonpitting edema about the ankles present.  No erythema or calor.  Negative Homans' sign bilaterally.  Telangiectasias bilaterally.  Neurological: Light touch sensation grossly intact bilateral feet.  Positive Tinel sign bilaterally left greater than right.  Musculoskeletal Exam: Mild pes planus foot type appreciated  bilaterally.  Muscle strength 5/5 in dorsiflexion, plantarflexion, inversion, eversion.  Pain on palpation of plantar medial and plantar central heel bilaterally  Right dorsal foot there is some tenderness along extensor tendons and near AITFL.  Decreased pain with resisted dorsiflexion and with plantarflexion.   Assessment/Plan of Care: 1. Tarsal tunnel syndrome of both lower extremities   2. Strain of extensor tendon of foot   3. Venous (peripheral) insufficiency   4. Sciatica of left side      Meds ordered this encounter  Medications   meloxicam  (MOBIC ) 15 MG tablet    Sig: Take 1 tablet (15 mg total) by mouth daily.    Dispense:  30 tablet    Refill:  0   AMB REFERRAL TO VASCULAR SURGERY/CLINIC  Discussed clinical findings with patient today.  Radiographs reviewed with patient  # Strain of right anterior ankle tendons Has not had much improvement with CAM boot immobilization -Recommend progression to ankle brace -Continue home rehab -Rx sent for meloxicam    # Tarsal tunnel syndrome bilateral versus sciatica pain, left greater than right - Minimal changes from previously - Suspect tarsal tunnel symptoms due to prominent veins noted on prior left foot MRI. - Recommend that she wear compression stockings and continue with the over-the-counter inserts. - We discussed conservative and surgical treatment.  She does not want surgery at this time.  Will send another referral to vascular surgery for evaluation of vein incompetency. -Did discuss with patient that some of the pain is likely mediated from sciatica symptoms as well.  Did discuss sciatica home rehab exercises.  She may need PT for this.  Has deferred PT at this point due to needing around the clock care for critically ill child    Follow-up in about 4 weeks.   Kasin Tonkinson L. Lamount MAUL, AACFAS [facility]     [address] [place], [place] [address]                 Office [phone]  Fax [phone]

## 2024-06-23 ENCOUNTER — Encounter: Payer: Self-pay | Admitting: Podiatry

## 2024-07-02 ENCOUNTER — Other Ambulatory Visit: Payer: Self-pay | Admitting: *Deleted

## 2024-07-02 DIAGNOSIS — G43709 Chronic migraine without aura, not intractable, without status migrainosus: Secondary | ICD-10-CM

## 2024-07-03 ENCOUNTER — Encounter: Payer: Self-pay | Admitting: Neurology

## 2024-07-22 ENCOUNTER — Ambulatory Visit: Admitting: Podiatry

## 2024-08-12 ENCOUNTER — Ambulatory Visit: Admitting: Podiatry

## 2024-08-19 ENCOUNTER — Encounter: Payer: Self-pay | Admitting: Neurology

## 2024-08-19 ENCOUNTER — Other Ambulatory Visit: Payer: Self-pay

## 2024-08-19 ENCOUNTER — Other Ambulatory Visit (HOSPITAL_COMMUNITY): Payer: Self-pay

## 2024-08-19 ENCOUNTER — Telehealth: Payer: Self-pay | Admitting: Pharmacy Technician

## 2024-08-19 ENCOUNTER — Telehealth: Payer: Self-pay

## 2024-08-19 ENCOUNTER — Ambulatory Visit (INDEPENDENT_AMBULATORY_CARE_PROVIDER_SITE_OTHER): Admitting: Neurology

## 2024-08-19 VITALS — BP 128/86 | HR 72 | Ht <= 58 in | Wt 203.0 lb

## 2024-08-19 DIAGNOSIS — G43E19 Chronic migraine with aura, intractable, without status migrainosus: Secondary | ICD-10-CM

## 2024-08-19 MED ORDER — ONDANSETRON HCL 4 MG PO TABS: 4.0000 mg | ORAL_TABLET | Freq: Three times a day (TID) | ORAL | 5 refills | Status: AC | PRN

## 2024-08-19 MED ORDER — SUMATRIPTAN SUCCINATE 50 MG PO TABS: 50.0000 mg | ORAL_TABLET | ORAL | 5 refills | Status: AC | PRN

## 2024-08-19 MED ORDER — ONABOTULINUMTOXINA 200 UNITS IJ SOLR
INTRAMUSCULAR | 4 refills | Status: AC
  Filled 2024-08-19: qty 1, fill #0
  Filled 2024-08-20: qty 1, 90d supply, fill #0
  Filled 2024-11-20: qty 1, 90d supply, fill #1

## 2024-08-19 NOTE — Addendum Note (Signed)
 Addended by: SKEET, Gabbrielle Mcnicholas R on: 08/19/2024 08:56 AM   Modules accepted: Level of Service

## 2024-08-19 NOTE — Telephone Encounter (Signed)
 Pharmacy Patient Advocate Encounter- Injection via Pharmacy Benefit:  PA was submitted  for Botox- [phone] to [employer] and has been approved through: 10.7.25 to 4.5.26 Authorization# [id number]  Please send prescription to Specialty Pharmacy: [facility]: [phone]  Estimated Pharmacy Copay is: $4  Patient is not eligible for Botox- [id number] Copay Card, which will make patient's copay as little as zero. Copay card will be provided to pharmacy.   Admin Code: 35384   Pending BotoxOne report require Prior Auth.

## 2024-08-19 NOTE — Patient Instructions (Addendum)
  Refer to eye doctor Botox STOP IBUPROFEN .  Take sumatriptan at earliest onset of headache.  May repeat dose once in 2 hours if needed.  Maximum 2 tablets in 24 hours. Zofran  for nausea Limit use of pain relievers to no more than 9 days out of the month.  These medications include acetaminophen , NSAIDs (ibuprofen /Advil /Motrin , naproxen/Aleve, triptans (Imitrex/sumatriptan), Excedrin, and narcotics.  This will help reduce risk of rebound headaches. Be aware of common food triggers Routine exercise Stay adequately hydrated (aim for 64 oz water daily) Keep headache diary Maintain proper stress management Maintain proper sleep hygiene Do not skip meals Consider supplements:  magnesium citrate 400mg  daily, riboflavin 400mg  daily, coenzyme Q10 100mg  three times daily.

## 2024-08-19 NOTE — Telephone Encounter (Signed)
 Pharmacy Patient Advocate Encounter  BotoxOne verification has been submitted. Benefit Verification #:   [id number]  Pharmacy PA has been submitted for BOTOX 200u via Latent. INSURANCE: [employer] DATE SUBMITTED: 10.7.25 KEY: [id number] Status is pending

## 2024-08-19 NOTE — Addendum Note (Signed)
 Addended by: OZELL JESUSA PARAS on: 08/19/2024 02:10 PM   Modules accepted: Orders

## 2024-08-19 NOTE — Telephone Encounter (Signed)
 Patient advised mychart message sent as well.
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
## 2024-08-19 NOTE — Telephone Encounter (Signed)
 PA has been submitted, and telephone encounter has been created. Please see telephone encounter dated 10.7.25.

## 2024-08-19 NOTE — Telephone Encounter (Signed)
 Patient seen in office today, Per Dr.Jaffe patient to start Botox 200 units every 90 days/

## 2024-08-20 ENCOUNTER — Other Ambulatory Visit: Payer: Self-pay | Admitting: Pharmacy Technician

## 2024-08-20 ENCOUNTER — Other Ambulatory Visit: Payer: Self-pay

## 2024-08-20 ENCOUNTER — Other Ambulatory Visit (HOSPITAL_COMMUNITY): Payer: Self-pay

## 2024-08-20 NOTE — Progress Notes (Signed)
 Specialty Pharmacy Initial Fill Coordination Note  TWILA RAPPA is a 38 y.o. female contacted today regarding initial fill of specialty medication(s) OnabotulinumtoxinA (BOTOX)   Patient requested Courier to Provider Office   Delivery date: 09/02/24   Verified address: [facility]  [address] [place], [place], [address]   Medication will be filled on 10.20.25.   Patient is aware of 4 copayment.

## 2024-08-22 ENCOUNTER — Other Ambulatory Visit (HOSPITAL_COMMUNITY): Payer: Self-pay

## 2024-08-22 NOTE — Telephone Encounter (Signed)
 CPT Code 35384 does not require PA/Pre-D

## 2024-08-25 ENCOUNTER — Other Ambulatory Visit (HOSPITAL_COMMUNITY): Payer: Self-pay

## 2024-08-26 ENCOUNTER — Encounter: Payer: Self-pay | Admitting: Podiatry

## 2024-08-26 ENCOUNTER — Ambulatory Visit (INDEPENDENT_AMBULATORY_CARE_PROVIDER_SITE_OTHER): Admitting: Podiatry

## 2024-08-26 ENCOUNTER — Ambulatory Visit

## 2024-08-26 DIAGNOSIS — M25372 Other instability, left ankle: Secondary | ICD-10-CM | POA: Diagnosis not present

## 2024-08-26 DIAGNOSIS — S96911A Strain of unspecified muscle and tendon at ankle and foot level, right foot, initial encounter: Secondary | ICD-10-CM

## 2024-08-26 DIAGNOSIS — G5753 Tarsal tunnel syndrome, bilateral lower limbs: Secondary | ICD-10-CM

## 2024-08-26 NOTE — Patient Instructions (Signed)
 [email or web address]

## 2024-08-26 NOTE — Progress Notes (Signed)
 Subjective:  Patient ID: Nina Wood, female    DOB: April 22, 1986,  MRN: [id number]  Chief Complaint  Patient presents with   Tarsal Tunnel    Bilateral tarsal tunnel syndrome, bilateral. Wearing fliflops today. 7/10 today, she tripped over concentrator last week. She did feel a pop but is walking ok. Easier for her to put slides on but does have some really good new balance.     Discussed the use of AI scribe software for clinical note transcription with the patient, who gave verbal consent to proceed.  History of Present Illness Nina Wood is a 38 year old female with tarsal tunnel syndrome and ankle problems who presents with new onset ankle pain following a fall.  Two nights ago, she experienced dizziness while moving her daughter's oxygen concentrator, leading to a fall. During the fall, she felt a 'crackling' sensation in her left ankle, which was different from previous experiences. She tripped over her house shoes and fell over the concentrator, resulting in injuries to her left ankle.  Her left ankle felt like it 'gave out' during the incident, with a sensation she has never felt before. She experiences pain diffusely in the left ankle, often  is most severe to the area over the lateral ankle ligaments but also reports pain to the back of the ankle over the Achilles and around the peroneal tendons.  Her right ankle is experiencing burning pain, particularly in the anterior medial aspect which she relates has been worse after her fall.  She has experienced random jolts of pain in her ankles even while at rest, but the sensation during the fall was new to her.      Objective:    Physical Exam DP and PT pulses palpable 2/4.  Capillary refill within normal limits.  Mild nonpitting edema about the ankles bilaterally.   Pedal skin well-hydrated within normal limits for skin texture and skin turgor  Left ankle tenderness on palpation of lateral ankle soft tissue structures, weakly  positive anterior drawer sign.  Some diffuse tenderness posterior ankle over Achilles without tendon thickening or dell, over peroneal tendons left side.  Right side pain with plantar flexion and dorsiflexion of the right foot.  Localized tenderness with subjective burning sensation over TA and EHL tendons right side.  Eversion and inversion strength of the right ankle less painful.  Decreased muscle strength right side secondary to to guarding.  Positive Tinel's sign   No images are attached to the encounter.    Results RADIOLOGY Right ankle x-ray: Ankle mortise in good alignment. No evidence of acute fractures. Slight pes planus appreciated on the lateral view. No periarticular spurring of the ankle joint. Questionable cystic lesion of the lateral articular portion of the tibia. (08/26/2024)  Left ankle x-ray: Ankle mortise in good alignment. No evidence of acute fractures. No significant signs of arthritis. No periarticular spurring. (08/26/2024)   Assessment:   1. Left ankle instability   2. Strain of tendon of right ankle   3. Tarsal tunnel syndrome of both lower extremities      Plan:  Patient was evaluated and treated and all questions answered.  Assessment and Plan Assessment & Plan Right ankle extensor tendon strain and pain-EHL and TA Acute strain with burning pain, likely from recent fall. No fractures or significant arthritis on x-ray. - Advise lace-up ankle brace for stability. - Provide ankle exercises for rehabilitation, avoid exacerbating pain. - Recommend icing and compression for support. - Discuss potential use of CBD  creams for inflammation. - Consider steroid injection if pain persists after acute phase, caution due to potential tendon weakening. - Advise follow-up if symptoms do not improve in 4-6 weeks. - She is concurrently dealing with ongoing recurrent migraines and has been told to limit use of oral analgesics, therefore holding off on oral NSAIDs.  She is  also allergic to Voltaren gel.  Chronic left ankle instability and pain Chronic instability and pain, exacerbated by recent fall. No fractures or significant arthritis on x-ray. - Advise compressive anklet for support and stability. - Provide ankle exercises for rehabilitation, avoid exacerbating pain. - Discuss potential use of CBD creams for inflammation. - Consider steroid injection if pain persists after acute phase, caution due to potential tendon weakening. - Advise follow-up if symptoms do not improve in 4-6 weeks.  Bilateral tarsal tunnel syndrome Ongoing bilateral tarsal tunnel syndrome contributing to foot and ankle symptoms.   There are some limitations to patient's treatment plan and treatment options due to previous failed treatments and due to patient's parental responsibilities.  Did discuss typical recommendation for cam boot however she has failed this in the past and this is aggravating her symptoms as well as had questionable compliance.  She also has children with severe chronic illness which she relates require round-the-clock care which has precluded physical therapy and other treatment options for the patient which would limit her mobility.      Return in about 6 weeks (around 10/07/2024), or if symptoms worsen or fail to improve, for ankle instability/tendon strain.

## 2024-09-01 ENCOUNTER — Other Ambulatory Visit: Payer: Self-pay

## 2024-09-05 ENCOUNTER — Ambulatory Visit: Admitting: Neurology

## 2024-09-05 DIAGNOSIS — G43E19 Chronic migraine with aura, intractable, without status migrainosus: Secondary | ICD-10-CM | POA: Diagnosis not present

## 2024-09-05 MED ORDER — ONABOTULINUMTOXINA 100 UNITS IJ SOLR
200.0000 [IU] | Freq: Once | INTRAMUSCULAR | Status: AC
  Administered 2024-09-05: 155 [IU] via INTRAMUSCULAR

## 2024-09-05 NOTE — Progress Notes (Signed)

## 2024-09-08 ENCOUNTER — Encounter: Payer: Self-pay | Admitting: Neurology

## 2024-09-09 ENCOUNTER — Other Ambulatory Visit: Payer: Self-pay

## 2024-09-09 DIAGNOSIS — I872 Venous insufficiency (chronic) (peripheral): Secondary | ICD-10-CM

## 2024-10-14 ENCOUNTER — Ambulatory Visit (HOSPITAL_COMMUNITY)
Admission: RE | Admit: 2024-10-14 | Discharge: 2024-10-14 | Disposition: A | Source: Ambulatory Visit | Attending: Surgery | Admitting: Surgery

## 2024-10-14 ENCOUNTER — Ambulatory Visit: Admitting: Vascular Surgery

## 2024-10-14 ENCOUNTER — Encounter: Payer: Self-pay | Admitting: Vascular Surgery

## 2024-10-14 VITALS — BP 124/87 | HR 70 | Temp 97.9°F | Ht <= 58 in | Wt 204.0 lb

## 2024-10-14 DIAGNOSIS — I872 Venous insufficiency (chronic) (peripheral): Secondary | ICD-10-CM | POA: Diagnosis present

## 2024-10-14 DIAGNOSIS — M25373 Other instability, unspecified ankle: Secondary | ICD-10-CM | POA: Diagnosis present

## 2024-10-14 NOTE — Progress Notes (Signed)
 [facility] [place] PROGRESS NOTE  ASSESSMENT / PLAN: Nina Wood is a 38 y.o. female with bilateral foot and ankle pain.  No clear vascular cause for discomfort seen.  No evidence of significant chronic venous insufficiency.  Normal pulse exam.  Encouraged her to follow-up with her podiatrist.  SUBJECTIVE: Patient presents to clinic for evaluation of bilateral foot and ankle pain associated with gait instability.  She has been working with Dr. Lamount and is considering foot and ankle surgery to help treat some of her discomfort.  OBJECTIVE: BP 124/87 (BP Location: Left Arm, Patient Position: Sitting, Cuff Size: Large)   Pulse 70   Temp 97.9 F (36.6 C)   Ht 4' 7 (1.397 m)   Wt 204 lb (92.5 kg)   SpO2 99%   BMI 47.41 kg/m  No acute distress Regular rate and rhythm Unlabored breathing 2+ dorsalis pedis pulses bilaterally No evidence of chronic venous insufficiency      Latest Ref Rng & Units 10/05/2023    4:47 PM 05/03/2023    8:49 AM 07/05/2022   12:03 PM  CBC  WBC 4.0 - 10.5 K/uL 10.5  7.2  19.5   Hemoglobin 12.0 - 15.0 g/dL 86.8  85.7  89.6   Hematocrit 36.0 - 46.0 % 40.5  42.2  30.4   Platelets 150 - 400 K/uL 408  396  433         Latest Ref Rng & Units 10/05/2023    5:24 PM 05/03/2023    8:49 AM 07/05/2022   12:03 PM  CMP  Glucose 70 - 99 mg/dL 893  83  892   BUN 6 - 20 mg/dL 18  16  11    Creatinine 0.44 - 1.00 mg/dL 9.37  9.39  9.64   Sodium 135 - 145 mmol/L 137  136  136   Potassium 3.5 - 5.1 mmol/L 3.9  3.9  3.6   Chloride 98 - 111 mmol/L 106  103  107   CO2 22 - 32 mmol/L 23  26  22    Calcium 8.9 - 10.3 mg/dL 8.7  9.6  9.4   Total Protein 6.5 - 8.1 g/dL 6.8  7.1  6.6   Total Bilirubin <1.2 mg/dL 0.6  0.4  0.3   Alkaline Phos 38 - 126 U/L 37  58  98   AST 15 - 41 U/L 14  12  12    ALT 0 - 44 U/L 14  7  12      CrCl cannot be calculated (Patient's most recent lab result is older than the maximum 21 days allowed.).   Lower Venous  Reflux Study   Patient Name:  Nina Wood  Date of Exam:   10/14/2024  Medical Rec #: [id number]      Accession #:    [id number]  Date of Birth: [birth year]      Patient Gender: F  Patient Age:   38 years  Exam Location:  [address]  Procedure:      VAS US  LOWER EXTREMITY VENOUS REFLUX  Referring Phys: DEBBY ROBERTSON    ---------------------------------------------------------------------------  -----    Other Indications: Patient reports pain that shoots down the leg and ankle  that                    is sometimes excruciating when she bears weight on the  ankle.   Performing Technologist: Edsel Mustard RVT     Examination Guidelines: A complete evaluation includes  B-mode imaging,  spectral  Doppler, color Doppler, and power Doppler as needed of all accessible  portions  of each vessel. Bilateral testing is considered an integral part of a  complete  examination. Limited examinations for reoccurring indications may be  performed  as noted. The reflux portion of the exam is performed with the patient in  reverse Trendelenburg.  Significant venous reflux is defined as >500 ms in the superficial venous  system, and >1 second in the deep venous system.     Venous Reflux Times  +--------------+---------+------+-----------+------------+--------+  RIGHT        Reflux NoRefluxReflux TimeDiameter cmsComments                          Yes                                   +--------------+---------+------+-----------+------------+--------+  CFV          no                                              +--------------+---------+------+-----------+------------+--------+  FV mid        no                                              +--------------+---------+------+-----------+------------+--------+  Popliteal    no                                              +--------------+---------+------+-----------+------------+--------+  GSV at [facility]     no                            0.58              +--------------+---------+------+-----------+------------+--------+  GSV prox thighno                            0.47              +--------------+---------+------+-----------+------------+--------+  GSV mid thigh no                            0.36              +--------------+---------+------+-----------+------------+--------+  GSV dist thighno                            0.40              +--------------+---------+------+-----------+------------+--------+  GSV at knee   no                            0.32              +--------------+---------+------+-----------+------------+--------+  GSV prox calf no  0.23              +--------------+---------+------+-----------+------------+--------+  GSV mid calf  no                            0.26              +--------------+---------+------+-----------+------------+--------+  GSV dist calf no                            0.23              +--------------+---------+------+-----------+------------+--------+  SSV at [facility]    no                            0.57              +--------------+---------+------+-----------+------------+--------+         Summary:  Right:  - No evidence of deep vein thrombosis seen in the right lower extremity,  from the common femoral through the popliteal veins.  - No evidence of superficial venous thrombosis in the right lower  extremity.  - There is no evidence of venous reflux seen in the right lower extremity.    *See table(s) above for measurements and observations.   Electronically signed by Debby Robertson on 10/14/2024 at 12:51:05 PM.   Debby SAILOR. Robertson, MD [facility] Phone Number: [phone] 10/14/2024 4:35 PM

## 2024-11-03 ENCOUNTER — Ambulatory Visit: Admitting: Neurology

## 2024-11-20 ENCOUNTER — Other Ambulatory Visit: Payer: Self-pay

## 2024-11-20 NOTE — Progress Notes (Signed)
 Specialty Pharmacy Refill Coordination Note  Nina Wood is a 39 y.o. female assessed today regarding refills of clinic administered specialty medication(s) OnabotulinumtoxinA  (BOTOX )   Clinic requested Courier to Provider Office   Delivery date: 12/01/24   Verified address: [facility]  [address] [place], [place], [address]   Medication will be filled on: 11/28/24

## 2024-11-28 ENCOUNTER — Other Ambulatory Visit: Payer: Self-pay

## 2024-11-28 ENCOUNTER — Other Ambulatory Visit (HOSPITAL_COMMUNITY): Payer: Self-pay

## 2024-11-28 NOTE — Progress Notes (Signed)
 LVM for payment information. No credit card on file

## 2024-11-28 NOTE — Telephone Encounter (Signed)
 Called patient and scheduled appointment for 2/11 with [facility]

## 2024-11-30 ENCOUNTER — Other Ambulatory Visit (HOSPITAL_COMMUNITY): Payer: Self-pay

## 2024-12-05 ENCOUNTER — Other Ambulatory Visit: Payer: Self-pay

## 2024-12-05 ENCOUNTER — Ambulatory Visit: Admitting: Neurology

## 2024-12-08 ENCOUNTER — Other Ambulatory Visit: Payer: Self-pay

## 2024-12-09 ENCOUNTER — Other Ambulatory Visit (HOSPITAL_COMMUNITY): Payer: Self-pay

## 2024-12-09 ENCOUNTER — Other Ambulatory Visit: Payer: Self-pay

## 2024-12-10 ENCOUNTER — Other Ambulatory Visit: Payer: Self-pay

## 2024-12-10 ENCOUNTER — Other Ambulatory Visit (HOSPITAL_COMMUNITY): Payer: Self-pay

## 2024-12-10 NOTE — Progress Notes (Signed)
 Botox  returned to stock-unable to reach patient to obtain credit card for copay-multiple attempts made.

## 2024-12-12 ENCOUNTER — Ambulatory Visit: Admitting: Neurology

## 2024-12-18 NOTE — Progress Notes (Unsigned)
 "  Referring Physician:  Jama Chow, MD [address] [place],  [place] [address]  Primary Physician:  Jama Chow, MD  History of Present Illness: Ms. Veora Fonte has a history of migraines, neuropathy in left leg, chronic pain, DVT, PE, FM, GERD, IBS, obesity, and PTSD.   She has history of gastric bypass.   Last had phone visit with me on 01/21/24 to review her lumbar MRI. She has known mild facet hypertrophy L4-S1. This may be causing some of her LBP. I did not see cause of leg pain on her lumbar MRI.   She declined PT and EMG. She wanted to f/u with podiatry and was lost to follow up. She messaged me in January asking about a land.   She is here for follow up.        Last seen by me on 12/26/23 for intermittent LBP with bilateral intermittent leg pain, left > right leg that started after having twins in September 2023. She has burning pain in her medial ankle that radiates up to her buttocks in both legs. She has numbness, tingling, and weakness in her legs.       She continues with intermittent LBP with constant bilateral leg pain that starts along medial ankle/foot and radiates up into her buttocks. Let leg > right leg, leg pain > LBP. She is also having intermittent mid back pain. She has numbness, tingling, and weakness in her legs. Pain is worse with any movement. Some improvement with rest, heat, and ice.    She is still wearing boot on left foot when she is out of house. She wears compression sleeves on both legs prn.    She is taking mobic , prn lyrica , prn ultram , prn norco 7.5.      She does not smoke.   Bowel/Bladder Dysfunction: she has urinary urgency. No bowel/bladder incontinence.   Conservative measures:  Physical therapy:  has not participated in Multimodal medical therapy including regular antiinflammatories: meloxicam , lyrica , tramadol , hydrocodone  Injections: no epidural steroid injections  Past Surgery: no spinal surgeries  MIKAYLAH LIBBEY has no symptoms of cervical myelopathy.  The symptoms are causing a significant impact on the patient's life.   Review of Systems:  A 10 point review of systems is negative, except for the pertinent positives and negatives detailed in the HPI.  Past Medical History: Past Medical History:  Diagnosis Date   Allergic rhinitis    Allergy to cats    Allergy to dogs    Anemia    Anxiety    Asthma    BMI 37.0-37.9, adult    Brain tumor (benign) (HCC)    Breast cyst    Chronic pain syndrome    Deep vein thrombosis (DVT) (HCC)    DVT (deep venous thrombosis) (HCC)    DVT (deep venous thrombosis) (HCC)    Endometriosis, uterus    Environmental allergies    Fibromyalgia syndrome    GERD (gastroesophageal reflux disease)    Headache    lesion on brain that causes migraines   Hip discomfort    IBS (irritable bowel syndrome)    with diarrhea   Neuropathy    in left leg lower due to vein problem   Obesity    Ovarian cyst    Paroxysmal dyspnea    PTSD (post-traumatic stress disorder)    Patient reported   Uterine fibroid     Past Surgical History: Past Surgical History:  Procedure Laterality Date   APPENDECTOMY  2003   CESAREAN SECTION     DILATION AND CURETTAGE OF UTERUS     1 WEEK AGO   DILATION AND EVACUATION Bilateral 05/18/2016   Procedure: DILATATION AND EVACUATION;  Surgeon: Rosaline Dani Wallner, MD;  Location: [facility];  Service: Gynecology;  Laterality: Bilateral;   ECTOPIC PREGNANCY SURGERY     etopi     GASTRIC BYPASS  03/14/2012   INDUCED ABORTION     MYOMECTOMY  06/23/2020   OTHER SURGICAL HISTORY     surgical removal of nexplanon   OVARIAN CYST SURGERY      Allergies: Allergies as of 12/24/2024 - Review Complete 10/14/2024  Allergen Reaction Noted   Fish allergy Anaphylaxis 02/24/2017   Other Swelling 04/27/2015   Penicillins Hives 04/27/2015   Shellfish allergy Anaphylaxis 04/27/2015   Peanut butter flavoring agent (non-screening) Hives 04/27/2015    Cat dander  04/12/2022   Dog epithelium (canis lupus familiaris)  04/12/2022   Histamine Other (See Comments) 04/27/2015   Voltaren [diclofenac] Rash and Other (See Comments) 04/27/2015    Medications: Outpatient Encounter Medications as of 12/24/2024  Medication Sig   Albuterol Sulfate, sensor, (PROAIR DIGIHALER) 108 (90 Base) MCG/ACT AEPB Proair Digihaler 90 mcg/actuation aerosol powder breath act, sensor  INHALE 2 PUFFS EVERY 4 HOURS AS NEEDED.   amitriptyline  (ELAVIL ) 25 MG tablet Take 1 tablet (25 mg total) by mouth at bedtime. (Patient not taking: Reported on 08/26/2024)   amLODipine (NORVASC) 2.5 MG tablet Take 2.5 mg by mouth daily. (Patient not taking: Reported on 08/26/2024)   botulinum toxin Type A  (BOTOX ) 200 units injection Inject 155 units IM into multiple site in the face,neck and head once every 90 days   cetirizine (ZYRTEC) 10 MG tablet Take 20-30 mg by mouth at bedtime.    Doxylamine-Pyridoxine 10-10 MG TBEC Take 2 tablets by mouth daily. (Patient not taking: Reported on 08/26/2024)   enoxaparin (LOVENOX) 40 MG/0.4ML injection  (Patient not taking: Reported on 08/26/2024)   EPINEPHRINE  0.3 mg/0.3 mL IJ SOAJ injection USE AS DIRECTED FOR LIFE THREATENING ALLERGIC REACTIONS   ferrous sulfate  325 (65 FE) MG EC tablet Take 1 tablet (325 mg total) by mouth daily with breakfast.   Fremanezumab -vfrm (AJOVY ) 225 MG/1.5ML SOSY INJECT 225 MG UNDER THE SKIN EVERY 30 DAYS   Hyprom-Naphaz-Polysorb-Zn Sulf (CLEAR EYES COMPLETE OP) Place 1 drop into both eyes 2 (two) times daily as needed (dry eyes/itching).    meloxicam  (MOBIC ) 15 MG tablet TAKE 1 TABLET(15 MG) BY MOUTH DAILY   meloxicam  (MOBIC ) 15 MG tablet Take 1 tablet (15 mg total) by mouth daily.   montelukast  (SINGULAIR ) 10 MG tablet TAKE 1 TABLET BY MOUTH EVERY DAY   omeprazole  (PRILOSEC) 20 MG capsule Take 1 capsule (20 mg total) by mouth daily. (Patient not taking: Reported on 08/26/2024)   ondansetron  (ZOFRAN ) 4 MG tablet Take 1  tablet (4 mg total) by mouth every 8 (eight) hours as needed.   phentermine 15 MG capsule Take 15 mg by mouth daily.   pregabalin  (LYRICA ) 75 MG capsule Take 1 capsule (75 mg total) by mouth 2 (two) times daily. (Patient taking differently: Take 75 mg by mouth as needed.)   SUMAtriptan  (IMITREX ) 50 MG tablet Take 1 tablet (50 mg total) by mouth as needed for migraine. May repeat in 2 hours if headache persists or recurs.  Maximum 2 tablets in 24 hours.   triamcinolone  cream (KENALOG ) 0.5 % Apply 1 application topically every 4 (four) hours as needed (rash/allergies).    Facility-Administered  Encounter Medications as of 12/24/2024  Medication   triamcinolone  acetonide (KENALOG ) 10 MG/ML injection 5 mg    Social History: Social History   Tobacco Use   Smoking status: Never   Smokeless tobacco: Never  Vaping Use   Vaping status: Never Used  Substance Use Topics   Alcohol use: Yes    Comment: wine   Drug use: Not Currently    Types: Marijuana    Comment: 09/23/20-states about a week ago    Family Medical History: Family History  Problem Relation Age of Onset   Hypertension Mother    Breast cancer Mother    Prostate cancer Father    Hypertension Maternal Uncle    Diabetes Paternal Aunt    Cancer Paternal Aunt    Cancer Maternal Grandmother    Diabetes Sister    Neuropathy Neg Hx        none pt is aware of    Physical Examination: There were no vitals filed for this visit.    Awake, alert, oriented to person, place, and time.  Speech is clear and fluent. Fund of knowledge is appropriate.   Cranial Nerves: Pupils equal round and reactive to light.  Facial tone is symmetric.    Mild lower posterior lumbar tenderness.   No abnormal lesions on exposed skin.   Strength: Side Biceps Triceps Deltoid Interossei Grip Wrist Ext. Wrist Flex.  R 5 5 5 5 5 5 5   L 5 5 5 5 5 5 5    Side Iliopsoas Quads Hamstring PF DF EHL  R 5 5 5 5 5 5   L 5 5 5 5  --- ---   Reflexes are 2+  and symmetric at the biceps, brachioradialis, patella and achilles. Unable to test achilles on left as she is wearing a boot.   Hoffman's is absent.  Clonus is not present right LE, unable to test in left LE due to boot.   Bilateral upper and lower extremity sensation is intact to light touch. Limited exam on left due to boot.   She has abnormal gait- she limps.   Medical Decision Making  Imaging: None  Assessment and Plan: Ms. Schexnider continues with intermittent LBP with constant bilateral leg pain that starts along medial ankle/foot and radiates up into her buttocks. Let leg > right leg, leg pain > LBP. She is also having intermittent mid back pain. She has numbness, tingling, and weakness in her legs.    She has known mild facet hypertrophy L4-S1. This may be causing some of her LBP. I don't see cause of leg pain on her lumbar MRI.    Treatment options discussed with patient and following plan made:    - Discussed getting EMG/NCS of bilateral legs to further evaluate leg symptoms. She thinks she had this done previously and declines.  - Recommend PT for lumbar spine. She declines.  - She would like to follow up with podiatry to discuss further treatment options.  - Will message her in 4 weeks to check on her progress.   I spent a total of *** minutes in face-to-face and non-face-to-face activities related to this patient's care today including review of outside records, review of imaging, review of symptoms, physical exam, discussion of differential diagnosis, discussion of treatment options, and documentation.   Glade Boys PA-C Dept. of Neurosurgery  "

## 2024-12-24 ENCOUNTER — Ambulatory Visit: Admitting: Orthopedic Surgery

## 2024-12-30 ENCOUNTER — Ambulatory Visit: Payer: Medicaid Other | Admitting: Internal Medicine

## 2025-01-15 ENCOUNTER — Ambulatory Visit: Admitting: Neurology

## 2025-03-06 ENCOUNTER — Ambulatory Visit: Payer: Self-pay | Admitting: Neurology

## 2025-03-20 ENCOUNTER — Ambulatory Visit: Payer: Self-pay | Admitting: Neurology

## 2025-04-17 ENCOUNTER — Ambulatory Visit: Admitting: Neurology
